# Patient Record
Sex: Male | Born: 1950 | Race: White | Hispanic: No | Marital: Married | Smoking: Never smoker
Health system: Southern US, Community
[De-identification: ages and names within clinical notes are randomized; demographics above are authoritative.]

## PROBLEM LIST (undated history)

## (undated) DIAGNOSIS — K219 Gastro-esophageal reflux disease without esophagitis: Secondary | ICD-10-CM

## (undated) DIAGNOSIS — M1990 Unspecified osteoarthritis, unspecified site: Secondary | ICD-10-CM

## (undated) DIAGNOSIS — J45909 Unspecified asthma, uncomplicated: Secondary | ICD-10-CM

## (undated) DIAGNOSIS — T7840XA Allergy, unspecified, initial encounter: Secondary | ICD-10-CM

## (undated) DIAGNOSIS — H269 Unspecified cataract: Secondary | ICD-10-CM

## (undated) HISTORY — DX: Gastro-esophageal reflux disease without esophagitis: K21.9

## (undated) HISTORY — DX: Unspecified cataract: H26.9

## (undated) HISTORY — DX: Unspecified osteoarthritis, unspecified site: M19.90

## (undated) HISTORY — PX: PERIPHERAL VASCULAR THROMBECTOMY: CATH118306

## (undated) HISTORY — DX: Unspecified asthma, uncomplicated: J45.909

## (undated) HISTORY — PX: EYE SURGERY: SHX253

## (undated) HISTORY — DX: Allergy, unspecified, initial encounter: T78.40XA

## (undated) HISTORY — PX: HERNIA REPAIR: SHX51

---

## 2009-02-25 ENCOUNTER — Encounter: Admission: RE | Admit: 2009-02-25 | Discharge: 2009-02-25 | Payer: Self-pay | Admitting: Gastroenterology

## 2012-10-01 ENCOUNTER — Ambulatory Visit (INDEPENDENT_AMBULATORY_CARE_PROVIDER_SITE_OTHER): Payer: 59 | Admitting: Physician Assistant

## 2012-10-01 VITALS — BP 108/67 | HR 88 | Temp 100.0°F | Resp 16 | Ht 67.0 in | Wt 162.0 lb

## 2012-10-01 DIAGNOSIS — J069 Acute upper respiratory infection, unspecified: Secondary | ICD-10-CM

## 2012-10-01 MED ORDER — IPRATROPIUM BROMIDE 0.03 % NA SOLN: 2.0000 | Freq: Two times a day (BID) | NASAL | Status: DC

## 2012-10-01 MED ORDER — GUAIFENESIN ER 1200 MG PO TB12: 1.0000 | ORAL_TABLET | Freq: Two times a day (BID) | ORAL | Status: DC | PRN

## 2012-10-01 MED ORDER — BENZONATATE 100 MG PO CAPS: 100.0000 mg | ORAL_CAPSULE | Freq: Three times a day (TID) | ORAL | Status: DC | PRN

## 2012-10-01 NOTE — Progress Notes (Signed)
  Subjective:    Patient ID: Randall Scott, male    DOB: 1950-07-31, 62 y.o.   MRN: [id number]  HPI This 62 y.o. male presents for evaluation of illness x 4 days.  Began as a scratchy throat and a little bit of cough, and progressed.  Low grade fever.  HA.  ST. Non-productive cough.  Feels tight in the upper chest. [place].  No GI/GU symptoms.   Past Medical History  Diagnosis Date  . Allergy   . Asthma     Past Surgical History  Procedure Laterality Date  . Hernia repair      Prior to Admission medications   Medication Sig Start Date End Date Taking? Authorizing Provider  aspirin 81 MG tablet Take 81 mg by mouth daily.   Yes Historical Provider, MD    No Known Allergies  History   Social History  . Marital Status: Married    Spouse Name: Aram Beecham    Number of Children: 2  . Years of Education: 12   Occupational History  . Machinist    Social History Main Topics  . Smoking status: Never Smoker   . Smokeless tobacco: Never Used  . Alcohol Use: No  . Drug Use: No  . Sexually Active: Yes    Birth Control/ Protection: None   Other Topics Concern  . Not on file   Social History Narrative   Lives with his wife.  Daughters are grown and live nearby.    Family History  Problem Relation Age of Onset  . Lung cancer Mother   . Lung cancer Father      Review of Systems As above.  No chest pain, SOB, HA, dizziness, vision change, N/V, diarrhea, constipation, dysuria, urinary urgency or frequency, myalgias, arthralgias or rash.     Objective:   Physical Exam  Blood pressure 108/67, pulse 88, temperature 100 F (37.8 C), resp. rate 16, height 5\' 7"  (1.702 m), weight 162 lb (73.483 kg). Body mass index is 25.37 kg/(m^2). Well-developed, well nourished WM who is awake, alert and oriented, in NAD. HEENT: [place]/AT, PERRL, EOMI.  Sclera and conjunctiva are clear.  EAC are patent, TMs are normal in appearance. Nasal mucosa is pink and moist. OP is clear. Neck: supple,  non-tender, no lymphadenopathy, thyromegaly. Heart: RRR, no murmur Lungs: normal effort, CTA Extremities: no cyanosis, clubbing or edema. Skin: warm and dry without rash. Psychologic: good mood and appropriate affect, normal speech and behavior.       Assessment & Plan:  Viral URI with cough - Plan: benzonatate (TESSALON) 100 MG capsule, ipratropium (ATROVENT) 0.03 % nasal spray, DISCONTINUED: Guaifenesin (MUCINEX MAXIMUM STRENGTH) 1200 MG TB12  He doesn't swallow big pills well, so will use OTC mucinex regular strength, which can be cut.  Anticipatory guidance provided.  Supportive care.

## 2012-10-01 NOTE — Patient Instructions (Signed)
Get plenty of rest and drink at least 64 ounces of water daily. 

## 2012-12-09 ENCOUNTER — Ambulatory Visit (INDEPENDENT_AMBULATORY_CARE_PROVIDER_SITE_OTHER): Payer: 59 | Admitting: Family Medicine

## 2012-12-09 VITALS — BP 126/84 | HR 60 | Temp 97.9°F | Resp 16 | Ht 67.25 in | Wt 152.8 lb

## 2012-12-09 DIAGNOSIS — L237 Allergic contact dermatitis due to plants, except food: Secondary | ICD-10-CM

## 2012-12-09 DIAGNOSIS — S058X9A Other injuries of unspecified eye and orbit, initial encounter: Secondary | ICD-10-CM

## 2012-12-09 DIAGNOSIS — S0502XA Injury of conjunctiva and corneal abrasion without foreign body, left eye, initial encounter: Secondary | ICD-10-CM

## 2012-12-09 DIAGNOSIS — L255 Unspecified contact dermatitis due to plants, except food: Secondary | ICD-10-CM

## 2012-12-09 MED ORDER — METHYLPREDNISOLONE ACETATE 80 MG/ML IJ SUSP
120.0000 mg | Freq: Once | INTRAMUSCULAR | Status: AC
  Administered 2012-12-09: 120 mg via INTRAMUSCULAR

## 2012-12-09 NOTE — Progress Notes (Signed)
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 This is typical for poison ivy he's had in the past 2: Patient was stuck in the eye the branch then has had persistent redness, discomfort and foreign body sensation. He's had brief loss of vision but now is seen more close to where he usually sees.  Objective: Patient has multiple streaking papules over the abdomen and both forearms Indication the eye reveals normal lid eversion, pupils equal reactive, normal fundi, fluorescin positive superficial 3-4 mm streak in the medial aspect of the cornea only left thigh  Assessment: Poison ivy and left corneal abrasion  Plan: Depo-Medrol 129 Patient given instructions regarding corneal abrasion with the understanding that this should resolve overnight.    signed, Elvina Sidle, Md

## 2012-12-09 NOTE — Patient Instructions (Addendum)
Poison [employer] ivy is a inflammation of the skin (contact dermatitis) caused by touching the allergens on the leaves of the ivy plant following previous exposure to the plant. The rash usually appears 48 hours after exposure. The rash is usually bumps (papules) or blisters (vesicles) in a linear pattern. Depending on your own sensitivity, the rash may simply cause redness and itching, or it may also progress to blisters which may break open. These must be well cared for to prevent secondary bacterial (germ) infection, followed by scarring. Keep any open areas dry, clean, dressed, and covered with an antibacterial ointment if needed. The eyes may also get puffy. The puffiness is worst in the morning and gets better as the day progresses. This dermatitis usually heals without scarring, within 2 to 3 weeks without treatment. HOME CARE INSTRUCTIONS  Thoroughly wash with soap and water as soon as you have been exposed to poison ivy. You have about one half hour to remove the plant resin before it will cause the rash. This washing will destroy the oil or antigen on the skin that is causing, or will cause, the rash. Be sure to wash under your fingernails as any plant resin there will continue to spread the rash. Do not rub skin vigorously when washing affected area. Poison ivy cannot spread if no oil from the plant remains on your body. A rash that has progressed to weeping sores will not spread the rash unless you have not washed thoroughly. It is also important to wash any clothes you have been wearing as these may carry active allergens. The rash will return if you wear the unwashed clothing, even several days later. Avoidance of the plant in the future is the best measure. Poison ivy plant can be recognized by the number of leaves. Generally, poison ivy has three leaves with flowering branches on a single stem. Diphenhydramine may be purchased over the counter and used as needed for itching. Do not drive with  this medication if it makes you drowsy.Ask your caregiver about medication for children. SEEK MEDICAL CARE IF:  Open sores develop.  Redness spreads beyond area of rash.  You notice purulent (pus-like) discharge.  You have increased pain.  Other signs of infection develop (such as fever). Document Released: 06/16/2000 Document Revised: 09/11/2011 Document Reviewed: 05/05/2009 [facility] Patient Information 2014 [place], [place]. Corneal Abrasion The cornea is the clear covering at the front and center of the eye. When looking at the colored portion (iris) of the eye, you are looking through that person's cornea.  This very thin tissue is made up of many layers. The surface layer is a single layer of cells called the corneal epithelium. This is one of the most sensitive tissues in the body. If a scratch or injury causes the corneal epithelium to come off, it is called a corneal abrasion. If the injury extends to the tissues below the epithelium, the condition is called a corneal ulcer.  CAUSES   Scratches.  Trauma.  Foreign body in the eye.  Some people have recurrences of abrasions in the area of the original injury even after they heal. This is called recurrent erosion syndrome. Recurrent erosion syndromes generally improve and go away with time. SYMPTOMS   Eye pain.  Difficulty or inability to keep the injured eye open.  The eye becomes very sensitive to light.  Recurrent erosions tend to happen suddenly, first thing in the morning  usually upon awakening and opening the eyes. DIAGNOSIS  Your eye professional  can diagnose a corneal abrasion during an eye exam. Dye is usually placed in the eye using a drop or a small paper strip moistened by the patient's tears. When the eye is examined with a special light, the abrasion shows up clearly because of the dye. TREATMENT   Small abrasions may be treated with antibiotic drops or ointment alone.  Usually a pressure patch is specially  applied. Pressure patches prevent the eye from blinking, allowing the corneal epithelium to heal. Because blinking is less, a pressure patch also reduces the amount of pain present in the eye during healing. Most corneal abrasions heal within 2-3 days with no effect on vision. WARNING: Do not drive or operate machinery while your eye is patched. Your ability to judge distances is impaired.  If abrasion becomes infected and spreads to the deeper tissues of the cornea, a corneal ulcer can result. This is serious because it can cause corneal scarring. Corneal scars interfere with light passing through the cornea, and cause a loss of vision in the involved eye.  If your caregiver has given you a follow-up appointment, it is very important to keep that appointment. Not keeping the appointment could result in a severe eye infection or permanent loss of vision. If there is any problem keeping the appointment, you must call back to this facility for assistance. SEEK MEDICAL CARE IF:   You have pain, light sensitivity and a scratchy feeling in one eye (or both).  Your pressure patch keeps loosening up and you can blink your eye under the patch after treatment.  Any kind of discharge develops from the involved eye after treatment or if the lids stick together in the morning.  You have the same symptoms in the morning as you did with the original abrasion days, weeks or months after the abrasion healed. MAKE SURE YOU:   Understand these instructions.  Will watch your condition.  Will get help right away if you are not doing well or get worse. Document Released: 06/16/2000 Document Revised: 09/11/2011 Document Reviewed: 01/23/2008 [facility] Patient Information 2014 [place], [place].

## 2012-12-26 ENCOUNTER — Other Ambulatory Visit: Payer: Self-pay | Admitting: Family Medicine

## 2012-12-26 ENCOUNTER — Ambulatory Visit (INDEPENDENT_AMBULATORY_CARE_PROVIDER_SITE_OTHER): Payer: 59 | Admitting: Family Medicine

## 2012-12-26 VITALS — BP 110/70 | HR 54 | Temp 98.0°F | Resp 17 | Ht 67.0 in | Wt 150.0 lb

## 2012-12-26 DIAGNOSIS — Z23 Encounter for immunization: Secondary | ICD-10-CM

## 2012-12-26 DIAGNOSIS — R1319 Other dysphagia: Secondary | ICD-10-CM

## 2012-12-26 DIAGNOSIS — D7589 Other specified diseases of blood and blood-forming organs: Secondary | ICD-10-CM

## 2012-12-26 DIAGNOSIS — N529 Male erectile dysfunction, unspecified: Secondary | ICD-10-CM

## 2012-12-26 DIAGNOSIS — Z0000 Encounter for general adult medical examination without abnormal findings: Secondary | ICD-10-CM

## 2012-12-26 DIAGNOSIS — R252 Cramp and spasm: Secondary | ICD-10-CM

## 2012-12-26 LAB — POCT CBC
MCH, POC: 33.7 pg — AB (ref 27–31.2)
MID (cbc): 0.5 (ref 0–0.9)
MPV: 8 fL (ref 0–99.8)
POC Granulocyte: 8.2 — AB (ref 2–6.9)
POC MID %: 4.9 %M (ref 0–12)
Platelet Count, POC: 193 10*3/uL (ref 142–424)
RBC: 5.01 M/uL (ref 4.69–6.13)
WBC: 10.4 10*3/uL — AB (ref 4.6–10.2)

## 2012-12-26 LAB — COMPREHENSIVE METABOLIC PANEL
ALT: 14 U/L (ref 0–53)
AST: 15 U/L (ref 0–37)
Chloride: 103 mEq/L (ref 96–112)
Creat: 1.04 mg/dL (ref 0.50–1.35)
Total Bilirubin: 0.9 mg/dL (ref 0.3–1.2)

## 2012-12-26 LAB — LIPID PANEL
Total CHOL/HDL Ratio: 2.6 Ratio
VLDL: 9 mg/dL (ref 0–40)

## 2012-12-26 LAB — PSA: PSA: 2.26 ng/mL (ref <=4.00)

## 2012-12-26 LAB — TSH: TSH: 1.602 u[IU]/mL (ref 0.350–4.500)

## 2012-12-26 MED ORDER — SILDENAFIL CITRATE 100 MG PO TABS: 50.0000 mg | ORAL_TABLET | Freq: Every day | ORAL | Status: DC | PRN

## 2012-12-26 NOTE — Patient Instructions (Addendum)
I will be in touch with your labs when they come in.  Don't fill the viagra just yet- let me look into your headache issue a bit first.   Please give Dr. Evette Cristal a call about your colonoscopy and swallowing issues.   Herbert Moors F MD Address: [address] [place], [place], [place] [address] [phone] Tetanus, Diphtheria, Pertussis (Tdap) Vaccine What You Need to Know WHY GET VACCINATED? Tetanus, diphtheria and pertussis can be very serious diseases, even for adolescents and adults. Tdap vaccine can protect [place] from these diseases. TETANUS (Lockjaw) causes painful muscle tightening and stiffness, usually all over the body.  It can lead to tightening of muscles in the head and neck so you can't open your mouth, swallow, or sometimes even breathe. Tetanus kills about 1 out of 5 people who are infected. DIPHTHERIA can cause a thick coating to form in the back of the throat.  It can lead to breathing problems, paralysis, heart failure, and death. PERTUSSIS (Whooping Cough) causes severe coughing spells, which can cause difficulty breathing, vomiting and disturbed sleep.  It can also lead to weight loss, incontinence, and rib fractures. Up to 2 in 100 adolescents and 5 in 100 adults with pertussis are hospitalized or have complications, which could include pneumonia and death. These diseases are caused by bacteria. Diphtheria and pertussis are spread from person to person through coughing or sneezing. Tetanus enters the body through cuts, scratches, or wounds. Before vaccines, the [place] saw as many as 200,000 cases a year of diphtheria and pertussis, and hundreds of cases of tetanus. Since vaccination began, tetanus and diphtheria have dropped by about 99% and pertussis by about 80%. TDAP VACCINE Tdap vaccine can protect adolescents and adults from tetanus, diphtheria, and pertussis. One dose of Tdap is routinely given at age 71 or 88. People who did not get Tdap at that age should get it as  soon as possible. Tdap is especially important for health care professionals and anyone having close contact with a baby younger than 12 months. Pregnant women should get a dose of Tdap during every pregnancy, to protect the newborn from pertussis. Infants are most at risk for severe, life-threatening complications from pertussis. A similar vaccine, called Td, protects from tetanus and diphtheria, but not pertussis. A Td booster should be given every 10 years. Tdap may be given as one of these boosters if you have not already gotten a dose. Tdap may also be given after a severe cut or burn to prevent tetanus infection. Your doctor can give you more information. Tdap may safely be given at the same time as other vaccines. SOME PEOPLE SHOULD NOT GET THIS VACCINE  If you ever had a life-threatening allergic reaction after a dose of any tetanus, diphtheria, or pertussis containing vaccine, OR if you have a severe allergy to any part of this vaccine, you should not get Tdap. Tell your doctor if you have any severe allergies.  If you had a coma, or long or multiple seizures within 7 days after a childhood dose of DTP or DTaP, you should not get Tdap, unless a cause other than the vaccine was found. You can still get Td.  Talk to your doctor if you:  have epilepsy or another nervous system problem,  had severe pain or swelling after any vaccine containing diphtheria, tetanus or pertussis,  ever had Guillain-Barr Syndrome (GBS),  aren't feeling well on the day the shot is scheduled. RISKS OF A VACCINE REACTION With any medicine,  including vaccines, there is a chance of side effects. These are usually mild and go away on their own, but serious reactions are also possible. Brief fainting spells can follow a vaccination, leading to injuries from falling. Sitting or lying down for about 15 minutes can help prevent these. Tell your doctor if you feel dizzy or light-headed, or have vision changes or  ringing in the ears. Mild problems following Tdap (Did not interfere with activities)  Pain where the shot was given (about 3 in 4 adolescents or 2 in 3 adults)  Redness or swelling where the shot was given (about 1 person in 5)  Mild fever of at least 100.71F (up to about 1 in 25 adolescents or 1 in 100 adults)  Headache (about 3 or 4 people in 10)  Tiredness (about 1 person in 3 or 4)  Nausea, vomiting, diarrhea, stomach ache (up to 1 in 4 adolescents or 1 in 10 adults)  Chills, body aches, sore joints, rash, swollen glands (uncommon) Moderate problems following Tdap (Interfered with activities, but did not require medical attention)  Pain where the shot was given (about 1 in 5 adolescents or 1 in 100 adults)  Redness or swelling where the shot was given (up to about 1 in 16 adolescents or 1 in 25 adults)  Fever over 102F (about 1 in 100 adolescents or 1 in 250 adults)  Headache (about 3 in 20 adolescents or 1 in 10 adults)  Nausea, vomiting, diarrhea, stomach ache (up to 1 or 3 people in 100)  Swelling of the entire arm where the shot was given (up to about 3 in 100). Severe problems following Tdap (Unable to perform usual activities, required medical attention)  Swelling, severe pain, bleeding and redness in the arm where the shot was given (rare). A severe allergic reaction could occur after any vaccine (estimated less than 1 in a million doses). WHAT IF THERE IS A SERIOUS REACTION? What should I look for?  Look for anything that concerns you, such as signs of a severe allergic reaction, very high fever, or behavior changes. Signs of a severe allergic reaction can include hives, swelling of the face and throat, difficulty breathing, a fast heartbeat, dizziness, and weakness. These would start a few minutes to a few hours after the vaccination. What should I do?  If you think it is a severe allergic reaction or other emergency that can't wait, call 9-1-1 or get the person  to the nearest hospital. Otherwise, call your doctor.  Afterward, the reaction should be reported to the "Vaccine Adverse Event Reporting System" (VAERS). Your doctor might file this report, or you can do it yourself through the VAERS web site at www.vaers.LAgents.no, or by calling 1-806-069-0164. VAERS is only for reporting reactions. They do not give medical advice.  THE NATIONAL VACCINE INJURY COMPENSATION PROGRAM The National Vaccine Injury Compensation Program (VICP) is a federal program that was created to compensate people who may have been injured by certain vaccines. Persons who believe they may have been injured by a vaccine can learn about the program and about filing a claim by calling 1-(934)701-7053 or visiting the VICP website at SpiritualWord.at. HOW CAN I LEARN MORE?  Ask your doctor.  Call your local or state health department.  Contact the Centers for Disease Control and Prevention (CDC):  Call 219-113-3954 or visit CDC's website at PicCapture.uy. CDC Tdap Vaccine VIS (11/09/11) Document Released: 12/19/2011 Document Revised: 03/13/2012 Document Reviewed: 12/19/2011 [employer] Patient Information 2014 [place], [place].

## 2012-12-26 NOTE — Progress Notes (Addendum)
[facility] [address] [place] [address] [phone]  Date:  12/26/2012   Name:  Randall Scott   DOB:  Jan 07, 1951   MRN:  [id number]  PCP:  No primary provider on file.    Chief Complaint: Annual Exam   History of Present Illness:  Randall Scott is a 62 y.o. very pleasant male patient who presents with the following:  Here today for a CPE. He is generally quite healthy.    He is fasting today.  He has a few concerns.  He had some hand cramping off an on- every now and then.  The cramping may last for 10 or 15 minutes. He may get this cramping once a week, and has noted it for about 2 months.  He is having some trouble swallowing.  He continues to have this problem- he sees Dr. Shawnie Dapper for GI. In 2010 he had a swallowing study which was mildly abornmal per report.  However, he was not told to follow-up.   He is also due for a colonoscopy, last was in 2003  He also notes that he had a "real bad" headache after sexual intercourse a couple of times. This last occurred about 6 months ago.  He has used viagra in the past- however, he has not used it in a couple of of years.  The HA has not been associated with viagra use.   He gets an ocasional HA at baseline, but nothing usually.   No syncope. No CP.    He is "geting by" without viagra, but would like to have some for prn use.    Plain Td in 2006- he has a new grandchild and is ok with getting a tdap today He has had the zostavax vaccine already   There are no active problems to display for this patient.   Past Medical History  Diagnosis Date  . Allergy   . Asthma     Past Surgical History  Procedure Laterality Date  . Hernia repair      History  Substance Use Topics  . Smoking status: Never Smoker   . Smokeless tobacco: Never Used  . Alcohol Use: No    Family History  Problem Relation Age of Onset  . Lung cancer Mother   . Lung cancer Father     No Known Allergies  Medication list has  been reviewed and updated.  Current Outpatient Prescriptions on File Prior to Visit  Medication Sig Dispense Refill  . aspirin 81 MG tablet Take 81 mg by mouth daily.      Marland Kitchen loratadine (CLARITIN) 10 MG tablet Take 10 mg by mouth daily.       No current facility-administered medications on file prior to visit.    Review of Systems:  As per HPI- otherwise negative.   Physical Examination: Filed Vitals:   12/26/12 0900  BP: 104/68  Pulse: 54  Temp: 98 F (36.7 C)  Resp: 17   Filed Vitals:   12/26/12 0900  Height: 5\' 7"  (1.702 m)  Weight: 150 lb (68.04 kg)   Body mass index is 23.49 kg/(m^2). Ideal Body Weight: Weight in (lb) to have BMI = 25: 159.3  GEN: WDWN, NAD, Non-toxic, A & O x 3, looks well HEENT: Atraumatic, Normocephalic. Neck supple. No masses, No LAD. Bilateral TM wnl, oropharynx normal.  PEERL,EOMI.   Ears and Nose: No external deformity. CV: RRR, No M/G/R. No JVD. No thrill. No extra heart sounds. PULM:  CTA B, no wheezes, crackles, rhonchi. No retractions. No resp. distress. No accessory muscle use. ABD: S, NT, ND, +BS. No rebound. No HSM. EXTR: No c/c/e NEURO Normal gait. Normal strength and DTR all extremities  PSYCH: Normally interactive. Conversant. Not depressed or anxious appearing.  Calm demeanor.  Gu:  No scrotal masses, no inguinal hernia, penis normal Results for orders placed in visit on 12/26/12  POCT CBC      Result Value Range   WBC 10.4 (*) 4.6 - 10.2 K/uL   Lymph, poc 1.7  0.6 - 3.4   POC LYMPH PERCENT 16.2  10 - 50 %L   MID (cbc) 0.5  0 - 0.9   POC MID % 4.9  0 - 12 %M   POC Granulocyte 8.2 (*) 2 - 6.9   Granulocyte percent 78.9  37 - 80 %G   RBC 5.01  4.69 - 6.13 M/uL   Hemoglobin 16.9  14.1 - 18.1 g/dL   HCT, POC 28.4  13.2 - 53.7 %   MCV 103.4 (*) 80 - 97 fL   MCH, POC 33.7 (*) 27 - 31.2 pg   MCHC 32.6  31.8 - 35.4 g/dL   RDW, POC 44.0     Platelet Count, POC 193  142 - 424 K/uL   MPV 8.0  0 - 99.8 fL    Assessment and  Plan: Physical exam - Plan: POCT CBC, Comprehensive metabolic panel, Lipid panel, PSA  Muscle cramps - Plan: Comprehensive metabolic panel  Other dysphagia - Plan: TSH  Erectile dysfunction - Plan: sildenafil (VIAGRA) 100 MG tablet  Healthy male here for a CPE today. Updated Tdap, await labs.   Muscle cramps: await CMP Refilled his viagra, but asked him to wait to fill this until I can look into his intercourse - associated headaches in more detail. He will go back and see his GI doctor, Dr. Shawnie Dapper, asap for his swallowing issue and screening colonoscopy.   Signed Abbe Amsterdam, MD  12/31/12- called to go over labs.  Labs look great overall, he does have an increased MCV and his B12/ folate are normal.  He does not generally drink alcohol.  We will plan to recheck in 2 months to see if this persists.   Regarding headaches; went over ddx, from a benign condition to aneurysm.  Offered an MRI and/ or neurology consultation.  At this time he has been without any of these HA in a long time.  He would prefer to think about this, and will let me know if he desires any further evaluation.  He will try some tonic water/ sports drinks for his cramps- let me know if not helpful.

## 2012-12-31 ENCOUNTER — Encounter: Payer: Self-pay | Admitting: Family Medicine

## 2012-12-31 NOTE — Addendum Note (Signed)
Addended by: Abbe Amsterdam C on: 12/31/2012 04:18 PM   Modules accepted: Orders

## 2013-01-01 ENCOUNTER — Telehealth: Payer: Self-pay

## 2013-01-01 NOTE — Telephone Encounter (Signed)
Pt has more questions re physical he completed a few days ago,he is asking for call back from dr Ovid Curd phone [phone]

## 2013-01-02 NOTE — Telephone Encounter (Signed)
Spoke to patient , he wants to discuss having a MRI scan for headaches, I asked him if he wants to proceed, he wants to discuss with you first. He also indicates he wants to know if you think he may have an aneurysm, he states he did call his insurance company, wants to make sure you absolutely think scan is necessary before he has this done.

## 2013-01-03 NOTE — Telephone Encounter (Signed)
Called him back and left detailed message.  If he would like to do an MRI to rule- out an aneurysm I am glad to order it for him, I am also glad to set up a neurology consultation.  I cannot put a number on his chance of having an aneurysm or other significant pathology.  A neurologist/ NSG might be better able to delineate his risk so he can decide about having the MRI

## 2013-01-15 ENCOUNTER — Other Ambulatory Visit: Payer: Self-pay | Admitting: Gastroenterology

## 2013-01-15 DIAGNOSIS — R1310 Dysphagia, unspecified: Secondary | ICD-10-CM

## 2013-01-17 ENCOUNTER — Ambulatory Visit
Admission: RE | Admit: 2013-01-17 | Discharge: 2013-01-17 | Disposition: A | Discharge status: Home / Self Care | Payer: 59 | Source: Ambulatory Visit | Attending: Gastroenterology | Admitting: Gastroenterology

## 2013-01-17 DIAGNOSIS — R1310 Dysphagia, unspecified: Secondary | ICD-10-CM

## 2013-03-25 ENCOUNTER — Ambulatory Visit (INDEPENDENT_AMBULATORY_CARE_PROVIDER_SITE_OTHER): Payer: 59 | Admitting: Family Medicine

## 2013-03-25 VITALS — BP 112/76 | HR 54 | Temp 98.0°F | Resp 16 | Ht 66.75 in | Wt 160.0 lb

## 2013-03-25 DIAGNOSIS — H9190 Unspecified hearing loss, unspecified ear: Secondary | ICD-10-CM

## 2013-03-25 DIAGNOSIS — M7711 Lateral epicondylitis, right elbow: Secondary | ICD-10-CM

## 2013-03-25 DIAGNOSIS — N529 Male erectile dysfunction, unspecified: Secondary | ICD-10-CM

## 2013-03-25 DIAGNOSIS — Z23 Encounter for immunization: Secondary | ICD-10-CM

## 2013-03-25 DIAGNOSIS — H9193 Unspecified hearing loss, bilateral: Secondary | ICD-10-CM

## 2013-03-25 DIAGNOSIS — M7710 Lateral epicondylitis, unspecified elbow: Secondary | ICD-10-CM

## 2013-03-25 MED ORDER — SILDENAFIL CITRATE 100 MG PO TABS: 50.0000 mg | ORAL_TABLET | Freq: Every day | ORAL | Status: DC | PRN

## 2013-03-25 MED ORDER — MELOXICAM 7.5 MG PO TABS: 7.5000 mg | ORAL_TABLET | Freq: Every day | ORAL | Status: DC

## 2013-03-25 NOTE — Patient Instructions (Addendum)
I think that you have lateral epicondylitis, otherwise known as tennis elbow.  I will get you referred to sports med for further treatment and possible injection.  In the meantime you can try taking mobic once a day .    Some good audiology offices in town include  [facility] [address] [place], [place] [address] [phone]  [facility] [address] [place], [place] [phone]  [facility]   Tennis Elbow Your caregiver has diagnosed you with a condition often referred to as "tennis elbow." This results from small tears or soreness (inflammation) at the start (origin) of the extensor muscles of the forearm. Although the condition is often called tennis or golfer's elbow, it is caused by any repetitive action performed by your elbow. HOME CARE INSTRUCTIONS  If the condition has been short lived, rest may be the only treatment required. Using your opposite hand or arm to perform the task may help. Even changing your grip may help rest the extremity. These may even prevent the condition from recurring.  Longer standing problems, however, will often be relieved faster by:  Using anti-inflammatory agents.  Applying ice packs for 30 minutes at the end of the working day, at bed time, or when activities are finished.  Your caregiver may also have you wear a splint or sling. This will allow the inflamed tendon to heal. At times, steroid injections aided with a local anesthetic will be required along with splinting for 1 to 2 weeks. Two to three steroid injections will often solve the problem. In some long standing cases, the inflamed tendon does not respond to conservative (non-surgical) therapy. Then surgery may be required to repair it. MAKE SURE YOU:   Understand these instructions.  Will watch your condition.  Will get help right away if you are not doing well or get worse. Document Released: 06/19/2005 Document Revised: 09/11/2011 Document  Reviewed: 02/05/2008 [facility] Patient Information 2014 [place], [place].

## 2013-03-25 NOTE — Progress Notes (Signed)
[facility] [address], [place] [address] [phone]  Date:  03/25/2013   Name:  Randall Scott   DOB:  [birth year]   MRN:  [id number]  PCP:  No PCP Per Patient    Chief Complaint: Tendonitis   History of Present Illness:  Randall Scott is a 62 y.o. very pleasant male patient who presents with the following:  He has noted tendonitis in his right elbow.  It has been present for about 3- 4 months.  He had a similar problem about 15 years ago and was treated with an injection.  He has been wearing an elbow sleeve but it is not really helping.   In the morning the elbow will feel stiff and is difficult to extend.  He has pain with gripping objects with the hand and holding heavy objects Insidious onset.  No apparent injury   He also has noted some trouble hearing when he is in a loud or crowded room.  He wonders if hearing aids might be helpful for him.  He only occasionally has tinnitus   There are no active problems to display for this patient.   Past Medical History  Diagnosis Date  . Allergy   . Asthma     Past Surgical History  Procedure Laterality Date  . Hernia repair      History  Substance Use Topics  . Smoking status: Never Smoker   . Smokeless tobacco: Never Used  . Alcohol Use: No    Family History  Problem Relation Age of Onset  . Lung cancer Mother   . Lung cancer Father     No Known Allergies  Medication list has been reviewed and updated.  Current Outpatient Prescriptions on File Prior to Visit  Medication Sig Dispense Refill  . aspirin 81 MG tablet Take 81 mg by mouth daily.      Marland Kitchen loratadine (CLARITIN) 10 MG tablet Take 10 mg by mouth daily.      . sildenafil (VIAGRA) 100 MG tablet Take 0.5-1 tablets (50-100 mg total) by mouth daily as needed for erectile dysfunction.  5 tablet  11   No current facility-administered medications on file prior to visit.    Review of Systems:  As per HPI- otherwise  negative.   Physical Examination: Filed Vitals:   03/25/13 0811  BP: 112/76  Pulse: 54  Temp: 98 F (36.7 C)  Resp: 16   Filed Vitals:   03/25/13 0811  Height: 5' 6.75" (1.695 m)  Weight: 160 lb (72.576 kg)   Body mass index is 25.26 kg/(m^2). Ideal Body Weight: Weight in (lb) to have BMI = 25: 158.1  GEN: WDWN, NAD, Non-toxic, A & O x 3 HEENT: Atraumatic, Normocephalic. Neck supple. No masses, No LAD.  Bilateral TM wnl, oropharynx normal.  PEERL,EOMI.   Ears and Nose: No external deformity. CV: RRR, No M/G/R. No JVD. No thrill. No extra heart sounds. PULM: CTA B, no wheezes, crackles, rhonchi. No retractions. No resp. distress. No accessory muscle use. EXTR: No c/c/e NEURO Normal gait.  PSYCH: Normally interactive. Conversant. Not depressed or anxious appearing.  Calm demeanor.  Right elbow:  He has full ROM, no redness, swelling or heat.  However he does have mild tenderness over the lateral epicondyle with direct palpation.  Negative to resisted pronation and supination  Assessment and Plan: Lateral epicondylitis (tennis elbow), right - Plan: meloxicam (MOBIC) 7.5 MG tablet, Ambulatory referral to Sports Medicine  Hearing loss, bilateral  Erectile dysfunction - Plan: sildenafil (VIAGRA) 100 MG tablet  Referral to sports med.  He may try mobic, but use caution as he is also on a baby asa Gave some information about reputable local audiology shops Refilled his viagra  Signed Abbe Amsterdam, MD

## 2013-03-31 ENCOUNTER — Encounter: Payer: Self-pay | Admitting: Family Medicine

## 2013-03-31 ENCOUNTER — Ambulatory Visit (INDEPENDENT_AMBULATORY_CARE_PROVIDER_SITE_OTHER): Payer: 59 | Admitting: Family Medicine

## 2013-03-31 VITALS — BP 138/90 | HR 59 | Ht 68.0 in | Wt 160.0 lb

## 2013-03-31 DIAGNOSIS — M7710 Lateral epicondylitis, unspecified elbow: Secondary | ICD-10-CM

## 2013-03-31 DIAGNOSIS — M7711 Lateral epicondylitis, right elbow: Secondary | ICD-10-CM

## 2013-03-31 NOTE — Patient Instructions (Addendum)
You have lateral epicondylitis Try to avoid painful activities as much as possible. Ice the area 3-4 times a day for 15 minutes at a time. Tylenol or aleve as needed for pain. Counterforce brace as directed can help unload area - wear this regularly if it provides you with relief. Home Pronation/supination with 1 pound weight, wrist extension, stretching - do these once a day - wait until Saturday to start. Consider formal physical therapy. Consider injection for short term pain relief if the above is not helping - you were given this today. Follow up with me in 5-6 weeks for reevaluation.

## 2013-04-01 ENCOUNTER — Encounter: Payer: Self-pay | Admitting: Family Medicine

## 2013-04-01 DIAGNOSIS — M7711 Lateral epicondylitis, right elbow: Secondary | ICD-10-CM | POA: Insufficient documentation

## 2013-04-01 NOTE — Progress Notes (Signed)
Patient ID: Randall Scott, male   DOB: [birth year], 62 y.o.   MRN: [id number]  PCP: No PCP Per Patient  Subjective:   HPI: Patient is a 62 y.o. male here for right elbow pain.  Patient reports prior history of tennis elbow over 15 years ago. States current pain started about 5 months ago lateral elbow. Pain when picking items up including coffee cup. Is right handed. Remotely tried a counterforce brace - not much help but injection helped a lot. No numbness/tingling.  Past Medical History  Diagnosis Date  . Allergy   . Asthma     Current Outpatient Prescriptions on File Prior to Visit  Medication Sig Dispense Refill  . aspirin 81 MG tablet Take 81 mg by mouth daily.      Marland Kitchen loratadine (CLARITIN) 10 MG tablet Take 10 mg by mouth daily.      . meloxicam (MOBIC) 7.5 MG tablet Take 1 tablet (7.5 mg total) by mouth daily.  30 tablet  0  . sildenafil (VIAGRA) 100 MG tablet Take 0.5-1 tablets (50-100 mg total) by mouth daily as needed for erectile dysfunction.  5 tablet  11   No current facility-administered medications on file prior to visit.    Past Surgical History  Procedure Laterality Date  . Hernia repair      No Known Allergies  History   Social History  . Marital Status: Married    Spouse Name: Aram Beecham    Number of Children: 2  . Years of Education: 12   Occupational History  . Machinist    Social History Main Topics  . Smoking status: Never Smoker   . Smokeless tobacco: Never Used  . Alcohol Use: No  . Drug Use: No  . Sexual Activity: Yes    Birth Control/ Protection: None     Comment: married   Other Topics Concern  . Not on file   Social History Narrative   Lives with his wife.  Daughters are grown and live nearby.    Family History  Problem Relation Age of Onset  . Lung cancer Mother   . Lung cancer Father   . Sudden death Neg Hx   . Hypertension Neg Hx   . Hyperlipidemia Neg Hx   . Heart attack Neg Hx   . Diabetes Neg Hx     BP 138/90   Pulse 59  Ht 5\' 8"  (1.727 m)  Wt 160 lb (72.576 kg)  BMI 24.33 kg/m2  Review of Systems: See HPI above.    Objective:  Physical Exam:  Gen: NAD  Right elbow: No gross deformity, swelling, bruising. TTP lateral epicondyle.  No other TTP about elbow. FROM elbow and wrist - pain with supination, wrist extension, 3rd digit extension. Collateral ligaments intact. NVI distally.    Assessment & Plan:  1. Right lateral epicondylitis - home exercise program reviewed to do daily.  Icing, tylenol,  Nsaids.  Cortisone injection given today.  Consider PT, nitro patches (states he's not picking up or taking viagra he was prescribed due to cost) if not improving.  F/u in 5-6 weeks.  After informed written consent patient was seated on exam table.  Area overlying lateral epicondyle (just distal to this) in place of maximal pain prepped with alcohol swab then injected with 2:1 marcaine: depomedrol.  Patient tolerated procedure well without immediate complications.

## 2013-04-01 NOTE — Assessment & Plan Note (Signed)
home exercise program reviewed to do daily.  Icing, tylenol,  Nsaids.  Cortisone injection given today.  Consider PT, nitro patches (states he's not picking up or taking viagra he was prescribed due to cost) if not improving.  F/u in 5-6 weeks.  After informed written consent patient was seated on exam table.  Area overlying lateral epicondyle (just distal to this) in place of maximal pain prepped with alcohol swab then injected with 2:1 marcaine: depomedrol.  Patient tolerated procedure well without immediate complications.

## 2013-05-12 ENCOUNTER — Ambulatory Visit (INDEPENDENT_AMBULATORY_CARE_PROVIDER_SITE_OTHER): Payer: 59 | Admitting: Emergency Medicine

## 2013-05-12 ENCOUNTER — Ambulatory Visit: Payer: 59

## 2013-05-12 ENCOUNTER — Other Ambulatory Visit: Payer: Self-pay | Admitting: Emergency Medicine

## 2013-05-12 VITALS — BP 108/72 | HR 79 | Temp 97.9°F | Resp 16 | Ht 68.5 in | Wt 167.2 lb

## 2013-05-12 DIAGNOSIS — M549 Dorsalgia, unspecified: Secondary | ICD-10-CM

## 2013-05-12 DIAGNOSIS — M5412 Radiculopathy, cervical region: Secondary | ICD-10-CM

## 2013-05-12 DIAGNOSIS — M5010 Cervical disc disorder with radiculopathy, unspecified cervical region: Secondary | ICD-10-CM

## 2013-05-12 DIAGNOSIS — R7989 Other specified abnormal findings of blood chemistry: Secondary | ICD-10-CM

## 2013-05-12 LAB — POCT CBC
Hemoglobin: 14.6 g/dL (ref 14.1–18.1)
MPV: 7.8 fL (ref 0–99.8)
POC Granulocyte: 4.3 (ref 2–6.9)
POC MID %: 6 %M (ref 0–12)
RBC: 4.55 M/uL — AB (ref 4.69–6.13)

## 2013-05-12 LAB — POCT SEDIMENTATION RATE: POCT SED RATE: 7 mm/hr (ref 0–22)

## 2013-05-12 MED ORDER — PREDNISONE 10 MG PO TABS: ORAL_TABLET | ORAL | Status: DC

## 2013-05-12 NOTE — Progress Notes (Addendum)
Subjective:    Patient ID: Randall Scott, male    DOB: [birth year], 62 y.o.   MRN: [id number] This chart was scribed for Juliann Mule, MD by Valera Castle, ED Scribe. This patient was seen in room 12 and the patient's care was started at 7:58.  HPI HPI Comments: Randall Scott is a 62 y.o. male who presents to the [facility] complaining of sudden, burning, constant upper back pain, onset 5-6 days ago. He reports that sometimes when he twists/leans to the side a certain way it takes his breath away. He also states that deep breathing exacerbates the pain. He states the pain feels similar to a stiff neck, just in his back. He states usually he is able to sleep through the pain, but that the other night he woke up because he could not get comfortable enough due to the pain. He reports feeling healthy otherwise. He states his last physical was 12/2012. He reports that he was told to f/u with regards to his last visit. He denies SOB, abdominal pain, and any other associated symptoms.    Patient Active Problem List   Diagnosis Date Noted  . Lateral epicondylitis of right elbow 04/01/2013   Past Medical History  Diagnosis Date  . Allergy   . Asthma    Past Surgical History  Procedure Laterality Date  . Hernia repair     No Known Allergies Prior to Admission medications   Medication Sig Start Date End Date Taking? Authorizing Provider  aspirin 81 MG tablet Take 81 mg by mouth daily.    Historical Provider, MD  sildenafil (VIAGRA) 100 MG tablet Take 0.5-1 tablets (50-100 mg total) by mouth daily as needed for erectile dysfunction. 03/25/13   Pearline Cables, MD    Review of Systems  Respiratory: Negative.  Negative for shortness of breath.   Gastrointestinal: Negative for abdominal pain.  Musculoskeletal: Positive for back pain (burning, upper).       Objective:   Physical Exam  Nursing note and vitals reviewed. Constitutional: He is oriented to person, place, and time. He appears well-developed  and well-nourished. No distress.  HENT:  Head: Normocephalic and atraumatic.  Neck: Neck supple. No tracheal deviation present.  Cardiovascular: Normal rate.   Pulmonary/Chest: Effort normal. No respiratory distress.  Abdominal: Soft. There is no tenderness. There is no rebound and no guarding.  Musculoskeletal: Normal range of motion.  Neurological: He is alert and oriented to person, place, and time.  Skin: Skin is warm and dry.  Psychiatric: He has a normal mood and affect. His behavior is normal.   there is tenderness along the left medial scapula. Breath sounds are symmetrical. Cardiac is regular rate without murmur. The abdomen is soft liver and spleen not enlarged there are no areas of tenderness . 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-C7 chest x-ray was normal T-spine films are normal Results for orders placed in visit on 05/12/13  POCT CBC      Result Value Range   WBC 6.3  4.6 - 10.2 K/uL   Lymph, poc 1.6  0.6 - 3.4   POC LYMPH PERCENT 25.9  10 - 50 %L   MID (cbc) 0.4  0 - 0.9   POC MID % 6.0  0 - 12 %M   POC Granulocyte 4.3  2 - 6.9   Granulocyte percent 68.1  37 - 80 %G   RBC 4.55 (*) 4.69 - 6.13  M/uL   Hemoglobin 14.6  14.1 - 18.1 g/dL   HCT, POC 45.4  09.8 - 53.7 %   MCV 99.0 (*) 80 - 97 fL   MCH, POC 32.1 (*) 27 - 31.2 pg   MCHC 32.4  31.8 - 35.4 g/dL   RDW, POC 11.9     Platelet Count, POC 226  142 - 424 K/uL   MPV 7.8  0 - 99.8 fL        BP 108/72  Pulse 79  Temp(Src) 97.9 F (36.6 C) (Oral)  Resp 16  Ht 5' 8.5" (1.74 m)  Wt 167 lb 3.2 oz (75.841 kg)  BMI 25.05 kg/m2  SpO2 98%     Assessment & Plan:   Patient presents with periscapular pain I am concerned origin maybe his neck. We'll put on a Sterapred dose pack recheck 1 week if symptoms are persistent we'll get an MRI . CBC is better MCV is slightly elevated. Previous B12, folic acid levels were normal.

## 2013-05-20 ENCOUNTER — Telehealth: Payer: Self-pay

## 2013-05-20 NOTE — Telephone Encounter (Signed)
Patient would like to let Dr. Cleta Alberts know that he is feeling better. The back pain is not completely gone but he is feeling better.  Best [phone]

## 2013-05-20 NOTE — Telephone Encounter (Signed)
Okay to hold off on MRI at present time. please let appointments know.

## 2013-05-20 NOTE — Telephone Encounter (Signed)
Pt is feeling better. He reports he is not 100%. Pt would like to hold off on an MRI at this time. He will monitor for the next few weeks and give [place] a call if he wants to schedule an appt.

## 2013-05-21 NOTE — Telephone Encounter (Signed)
Message sent to appts in error, forwarding to referrals.

## 2013-08-12 ENCOUNTER — Telehealth: Payer: Self-pay

## 2013-08-12 DIAGNOSIS — R519 Headache, unspecified: Secondary | ICD-10-CM

## 2013-08-12 DIAGNOSIS — S0990XS Unspecified injury of head, sequela: Secondary | ICD-10-CM

## 2013-08-12 DIAGNOSIS — G44309 Post-traumatic headache, unspecified, not intractable: Secondary | ICD-10-CM

## 2013-08-12 DIAGNOSIS — R51 Headache: Principal | ICD-10-CM

## 2013-08-12 NOTE — Telephone Encounter (Signed)
Dr.Copland,  Pt states that he would like to speak with you regarding whether or not he should have an MRI. Best# [id number]

## 2013-08-13 NOTE — Telephone Encounter (Signed)
Called him back.  He mentioned having some headaches in June of 2014.  He is still having headaches sometimes after intercourse.  His headaches are not terribly severe.  I will refer him to neurology for further evaluation and determination of if he needs an MRI.   If any change in the meantime he will let me know

## 2013-08-19 ENCOUNTER — Ambulatory Visit: Payer: 59 | Admitting: Neurology

## 2013-08-21 ENCOUNTER — Encounter (INDEPENDENT_AMBULATORY_CARE_PROVIDER_SITE_OTHER): Payer: Self-pay

## 2013-08-21 ENCOUNTER — Encounter: Payer: Self-pay | Admitting: Neurology

## 2013-08-21 ENCOUNTER — Ambulatory Visit (INDEPENDENT_AMBULATORY_CARE_PROVIDER_SITE_OTHER): Payer: 59 | Admitting: Neurology

## 2013-08-21 VITALS — BP 116/82 | HR 72 | Ht 68.5 in | Wt 170.0 lb

## 2013-08-21 DIAGNOSIS — R51 Headache: Secondary | ICD-10-CM | POA: Insufficient documentation

## 2013-08-21 DIAGNOSIS — R519 Headache, unspecified: Secondary | ICD-10-CM | POA: Insufficient documentation

## 2013-08-21 NOTE — Patient Instructions (Signed)
Overall you are doing fairly well but I do want to suggest a few things today:   Remember to drink plenty of fluid, eat healthy meals and do not skip any meals. Try to eat protein with a every meal and eat a healthy snack such as fruit or nuts in between meals. Try to keep a regular sleep-wake schedule and try to exercise daily, particularly in the form of walking, 20-30 minutes a day, if you can.   As far as your medications are concerned, I would like to suggest the following: 1)Please try taking an ibuprofen 30 to 60 minutes prior to intercourse. This should help relieve your headache symptoms  We will check a MRA of the brain to look at the vessels, you will be called to schedule this.  Please call us with any interim questions, concerns, problems, updates or refill requests.   My clinical assistant and will answer any of your questions and relay your messages to me and also relay most of my messages to you.   Our phone number is [phone]. We also have an after hours call service for urgent matters and there is a physician on-call for urgent questions. For any emergencies you know to call 911 or go to the nearest emergency room

## 2013-08-21 NOTE — Progress Notes (Signed)
[facility]    Provider:  Dr Janann Colonel Referring Provider: Lorelei Pont Gay Filler, MD Primary Care Physician:  Lamar Blinks, MD  CC:  headache  HPI:  Randall Scott is a 63 y.o. male here as a referral from Dr. Lorelei Pont for headache evaluation  He first noted headaches during intercourse, described as bitemporal, lasting 10-15 minutes, since then has had a few more since then but more mild. Describes onset as slow and progressive, occurred during intercourse and reached peak at orgasm. Described as a squeezing sensation, no N/V, no change in vision, no focal motor or sensory changes. Went away on its own after around 15 minutes. At worse was 7/10. Able to communicate during the headaches, no confusion, no tinnitus.  Lately appears to be happening every time he has intercourse. Not currently using viagra. Has had tension type headaches in the past but nothing like this. Has occasional cervical neck stiffness.   Overall healthy, no HTN. No history of stroke or TIA. No known family history of aneurysms, dementia, strokes.    Review of Systems: Out of a complete 14 system review, the patient complains of only the following symptoms, and all other reviewed systems are negative. + for dizziness, headache, difficulty swallowing, insomnia, snoring  History   Social History  . Marital Status: Married    Spouse Name: Caren Griffins    Number of Children: 2  . Years of Education: 12   Occupational History  . Machinist    Social History Main Topics  . Smoking status: Never Smoker   . Smokeless tobacco: Never Used  . Alcohol Use: No  . Drug Use: No  . Sexual Activity: Yes    Birth Control/ Protection: None     Comment: married   Other Topics Concern  . Not on file   Social History Narrative   Lives with his wife.  Daughters are grown and live nearby.   Patient is right handed   Education level is 12   Caffeine consumption is 2-3 cups daily    Family History  Problem  Relation Age of Onset  . Lung cancer Mother   . Lung cancer Father   . Sudden death Neg Hx   . Hypertension Neg Hx   . Hyperlipidemia Neg Hx   . Heart attack Neg Hx   . Diabetes Neg Hx     Past Medical History  Diagnosis Date  . Allergy   . Asthma     Past Surgical History  Procedure Laterality Date  . Hernia repair      Current Outpatient Prescriptions  Medication Sig Dispense Refill  . aspirin 81 MG tablet Take 81 mg by mouth daily.       No current facility-administered medications for this visit.    Allergies as of 08/21/2013  . (No Known Allergies)    Vitals: BP 116/82  Pulse 72  Ht 5' 8.5" (1.74 m)  Wt 170 lb (77.111 kg)  BMI 25.47 kg/m2 Last Weight:  Wt Readings from Last 1 Encounters:  08/21/13 170 lb (77.111 kg)   Last Height:   Ht Readings from Last 1 Encounters:  08/21/13 5' 8.5" (1.74 m)     Physical exam: Exam: Gen: NAD, conversant Eyes: anicteric sclerae, moist conjunctivae HENT: Atraumatic, oropharynx clear Neck: Trachea midline; supple,  Lungs: CTA, no wheezing, rales, rhonic                          CV: RRR, no  MRG Abdomen: Soft, non-tender;  Extremities: No peripheral edema  Skin: Normal temperature, no rash,  Psych: Appropriate affect, pleasant  Neuro: MS: AA&Ox3, appropriately interactive, normal affect   Attention: WORLD backwards  Speech: fluent w/o paraphasic error  Memory: good recent and remote recall  CN: PERRL, VFF to FC bilat, EOMI no nystagmus, no ptosis, sensation intact to LT V1-V3 bilat, face symmetric, no weakness, hearing grossly intact, palate elevates symmetrically, shoulder shrug 5/5 bilat,  tongue protrudes midline, no fasiculations noted.  Motor: normal bulk and tone Strength: 5/5  In all extremities  Coord: rapid alternating and point-to-point (FNF, HTS) movements intact.  Reflexes: symmetrical, bilat downgoing toes  Sens: LT intact in all extremities  Gait: posture, stance, stride and  arm-swing normal. Tandem gait intact. Able to walk on heels and toes. Romberg absent.   Assessment:  After physical and neurologic examination, review of laboratory studies, imaging, neurophysiology testing and pre-existing records, assessment will be reviewed on the problem list.  Plan:  Treatment plan and additional workup will be reviewed under Problem List.  1)headache  62y/o gentleman presenting for initial evaluation of headache associated with sexual intercourse. Differential would include benign sexual headache vs possible vascular process. Based on clinical description of slow progressive onset with a dull pressure pain would suspect this is likely a benign process. Will check MRA head to rule out vascular process. Patient counseled to try taking ibuprofen 30-60 minutes prior to intercourse. If no improvement would consider Indomethacin or Imitrex if warranted. Follow up once MRA completed.    Jim Like, DO  [facility] [address] [place], [place] [address]  Phone [phone] Fax [phone]

## 2013-08-27 ENCOUNTER — Ambulatory Visit (INDEPENDENT_AMBULATORY_CARE_PROVIDER_SITE_OTHER): Payer: 59

## 2013-08-27 DIAGNOSIS — R51 Headache: Secondary | ICD-10-CM

## 2013-09-01 NOTE — Progress Notes (Signed)
Quick Note:  Informed that MRA head per Dr Hazle Quant findings was normal, patient verbalized understanding ______

## 2014-02-02 ENCOUNTER — Ambulatory Visit (INDEPENDENT_AMBULATORY_CARE_PROVIDER_SITE_OTHER): Payer: 59 | Admitting: Family Medicine

## 2014-02-02 ENCOUNTER — Encounter: Payer: Self-pay | Admitting: Family Medicine

## 2014-02-02 VITALS — BP 126/85 | HR 75 | Ht 68.0 in | Wt 160.0 lb

## 2014-02-02 DIAGNOSIS — M7711 Lateral epicondylitis, right elbow: Secondary | ICD-10-CM

## 2014-02-02 DIAGNOSIS — M7710 Lateral epicondylitis, unspecified elbow: Secondary | ICD-10-CM

## 2014-02-02 NOTE — Patient Instructions (Signed)
You have lateral epicondylitis Try to avoid painful activities as much as possible. Ice the area 3-4 times a day for 15 minutes at a time. Tylenol or aleve as needed for pain. Counterforce brace as directed can help unload area - wear this regularly if it provides you with relief. Home Pronation/supination with 1 pound weight, wrist extension, stretching - do these once a day 3 sets of 10. Consider formal physical therapy. Consider injection for short term pain relief if the above is not helping. Consider nitro patches. Follow up with me in 6 weeks for reevaluation.

## 2014-02-04 ENCOUNTER — Encounter: Payer: Self-pay | Admitting: Family Medicine

## 2014-02-04 NOTE — Progress Notes (Signed)
Patient ID: Randall Scott, male   DOB: 02-28-1951, 63 y.o.   MRN: [id number]  PCP: Lamar Blinks, MD  Subjective:   HPI: Patient is a 63 y.o. male here for right elbow pain.  03/31/13: Patient reports prior history of tennis elbow over 15 years ago. States current pain started about 5 months ago lateral elbow. Pain when picking items up including coffee cup. Is right handed. Remotely tried a counterforce brace - not much help but injection helped a lot. No numbness/tingling.  02/03/14: Patient reports he had done really well up until past couple months. Pain lateral elbow again though not as severe as last time. Hard to hold items, straighten out elbow. Pain comes and goes. No numbness/tingling.  Past Medical History  Diagnosis Date  . Allergy   . Asthma     Current Outpatient Prescriptions on File Prior to Visit  Medication Sig Dispense Refill  . aspirin 81 MG tablet Take 81 mg by mouth daily.       No current facility-administered medications on file prior to visit.    Past Surgical History  Procedure Laterality Date  . Hernia repair      No Known Allergies  History   Social History  . Marital Status: Married    Spouse Name: Caren Griffins    Number of Children: 2  . Years of Education: 12   Occupational History  . Machinist    Social History Main Topics  . Smoking status: Never Smoker   . Smokeless tobacco: Never Used  . Alcohol Use: No  . Drug Use: No  . Sexual Activity: Yes    Birth Control/ Protection: None     Comment: married   Other Topics Concern  . Not on file   Social History Narrative   Lives with his wife.  Daughters are grown and live nearby.   Patient is right handed   Education level is 12   Caffeine consumption is 2-3 cups daily    Family History  Problem Relation Age of Onset  . Lung cancer Mother   . Lung cancer Father   . Sudden death Neg Hx   . Hypertension Neg Hx   . Hyperlipidemia Neg Hx   . Heart attack Neg Hx   . Diabetes  Neg Hx     BP 126/85  Pulse 75  Ht 5\' 8"  (1.727 m)  Wt 160 lb (72.576 kg)  BMI 24.33 kg/m2  Review of Systems: See HPI above.    Objective:  Physical Exam:  Gen: NAD  Right elbow: No gross deformity, swelling, bruising. TTP lateral epicondyle.  No other TTP about elbow. FROM elbow and wrist - pain with supination, wrist extension, 3rd digit extension. Collateral ligaments intact. NVI distally.    Assessment & Plan:  1. Right lateral epicondylitis - Discussed his options as this has recurred.  Declined injection for now - did well with this and HEP in past.  Will start home exercises, new counterforce brace.  Meloxicam, icing, tylenol.  Consider PT, nitro patches, injection if not improving.  F/u in 5-6 weeks.

## 2014-02-04 NOTE — Assessment & Plan Note (Signed)
Discussed his options as this has recurred.  Declined injection for now - did well with this and HEP in past.  Will start home exercises, new counterforce brace.  Meloxicam, icing, tylenol.  Consider PT, nitro patches, injection if not improving.  F/u in 5-6 weeks.

## 2014-03-16 ENCOUNTER — Ambulatory Visit: Payer: 59 | Admitting: Family Medicine

## 2014-04-15 ENCOUNTER — Ambulatory Visit: Payer: 59 | Admitting: Family Medicine

## 2014-04-21 ENCOUNTER — Ambulatory Visit (INDEPENDENT_AMBULATORY_CARE_PROVIDER_SITE_OTHER): Payer: 59 | Admitting: Family Medicine

## 2014-04-21 VITALS — BP 110/72 | HR 66 | Temp 98.5°F | Resp 18 | Ht 68.0 in | Wt 169.0 lb

## 2014-04-21 DIAGNOSIS — Z13220 Encounter for screening for lipoid disorders: Secondary | ICD-10-CM

## 2014-04-21 DIAGNOSIS — Z125 Encounter for screening for malignant neoplasm of prostate: Secondary | ICD-10-CM

## 2014-04-21 DIAGNOSIS — D7589 Other specified diseases of blood and blood-forming organs: Secondary | ICD-10-CM

## 2014-04-21 DIAGNOSIS — Z131 Encounter for screening for diabetes mellitus: Secondary | ICD-10-CM

## 2014-04-21 DIAGNOSIS — Z23 Encounter for immunization: Secondary | ICD-10-CM

## 2014-04-21 DIAGNOSIS — Z0000 Encounter for general adult medical examination without abnormal findings: Secondary | ICD-10-CM

## 2014-04-21 LAB — CBC
HEMATOCRIT: 45.2 % (ref 39.0–52.0)
Hemoglobin: 15.8 g/dL (ref 13.0–17.0)
MCH: 33 pg (ref 26.0–34.0)
MCHC: 35 g/dL (ref 30.0–36.0)
MCV: 94.4 fL (ref 78.0–100.0)
PLATELETS: 227 10*3/uL (ref 150–400)
RBC: 4.79 MIL/uL (ref 4.22–5.81)
RDW: 13.1 % (ref 11.5–15.5)
WBC: 7 10*3/uL (ref 4.0–10.5)

## 2014-04-21 NOTE — Patient Instructions (Signed)
I will be in touch with your labs asap- I'll send you a letter.  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  If you have not already had your shingles shot you can get this at your convenience.  You will be due for your pneumonia vaccines when you turn 65

## 2014-04-21 NOTE — Progress Notes (Signed)
[facility] [address], [place] [address] [phone]  Date:  04/21/2014   Name:  Randall Scott   DOB:  1951-05-06   MRN:  [id number]  PCP:  Lamar Blinks, MD    Chief Complaint: Annual Exam   History of Present Illness:  ARICK MARENO is a 63 y.o. very pleasant male patient who presents with the following:  He is here today for a CPE.  He is fasting today and would like to get a flu shot.  His labs were done last in 12/2012; all looked good.   Colonoscopy is UTD.   He walks with his wife frequently, and does lift weights at home some of the time. He is recently retired and does note that it is harder to control his weight since he no longer works. However he is enjoying his retirement especially as his children and grandchildren live nearby.    He is a non- smoker.  He does not drink to excess   Patient Active Problem List   Diagnosis Date Noted  . Headache(784.0) 08/21/2013  . Lateral epicondylitis of right elbow 04/01/2013    Past Medical History  Diagnosis Date  . Allergy   . Asthma   . Cataract     Past Surgical History  Procedure Laterality Date  . Hernia repair    . Eye surgery      History  Substance Use Topics  . Smoking status: Never Smoker   . Smokeless tobacco: Never Used  . Alcohol Use: No    Family History  Problem Relation Age of Onset  . Lung cancer Mother   . Lung cancer Father   . Sudden death Neg Hx   . Hypertension Neg Hx   . Hyperlipidemia Neg Hx   . Heart attack Neg Hx   . Diabetes Neg Hx     No Known Allergies  Medication list has been reviewed and updated.  Current Outpatient Prescriptions on File Prior to Visit  Medication Sig Dispense Refill  . aspirin 81 MG tablet Take 81 mg by mouth daily.       No current facility-administered medications on file prior to visit.    Review of Systems:  As per HPI- otherwise negative.   Physical Examination: Filed Vitals:   04/21/14 0834  BP: 110/72   Pulse: 66  Temp: 98.5 F (36.9 C)  Resp: 18   Filed Vitals:   04/21/14 0834  Height: 5\' 8"  (1.727 m)  Weight: 169 lb (76.658 kg)   Body mass index is 25.7 kg/(m^2). Ideal Body Weight: Weight in (lb) to have BMI = 25: 164.1  GEN: WDWN, NAD, Non-toxic, A & O x 3, looks well HEENT: Atraumatic, Normocephalic. Neck supple. No masses, No LAD.  Bilateral TM wnl, oropharynx normal.  PEERL,EOMI.   Ears and Nose: No external deformity. CV: RRR, No M/G/R. No JVD. No thrill. No extra heart sounds. PULM: CTA B, no wheezes, crackles, rhonchi. No retractions. No resp. distress. No accessory muscle use. ABD: S, NT, ND. No rebound. No HSM. EXTR: No c/c/e NEURO Normal gait.  PSYCH: Normally interactive. Conversant. Not depressed or anxious appearing.  Calm demeanor.  Gu: normal exam of testes, penis and prostate  Assessment and Plan: Physical exam - Plan: Flu Vaccine QUAD 36+ mos IM  Screening for hyperlipidemia - Plan: Lipid panel  Screening for diabetes mellitus - Plan: Comprehensive metabolic panel  Screening for prostate cancer - Plan: PSA  Macrocytosis - Plan: CBC  Healthy male here for a CPE.  He is doing very well.  Check labs, flu shot.  Will follow-up pending labs   Signed Lamar Blinks, MD

## 2014-04-28 ENCOUNTER — Telehealth: Payer: Self-pay | Admitting: *Deleted

## 2014-04-28 NOTE — Telephone Encounter (Signed)
Pt needs to come back in for fasting labs no charge.  Need to collect from 04/21/14, CMET, Lipid and PSA.  LMOM to cb

## 2014-04-29 ENCOUNTER — Other Ambulatory Visit (INDEPENDENT_AMBULATORY_CARE_PROVIDER_SITE_OTHER): Payer: 59 | Admitting: *Deleted

## 2014-04-29 DIAGNOSIS — Z125 Encounter for screening for malignant neoplasm of prostate: Secondary | ICD-10-CM

## 2014-04-29 DIAGNOSIS — Z131 Encounter for screening for diabetes mellitus: Secondary | ICD-10-CM

## 2014-04-29 DIAGNOSIS — Z13220 Encounter for screening for lipoid disorders: Secondary | ICD-10-CM

## 2014-04-29 NOTE — Telephone Encounter (Signed)
Pt will return for blood draw

## 2014-04-29 NOTE — Progress Notes (Signed)
Pt here for lab draw only  

## 2014-04-30 ENCOUNTER — Encounter: Payer: Self-pay | Admitting: Family Medicine

## 2014-04-30 LAB — LIPID PANEL
CHOL/HDL RATIO: 2.8 ratio
Cholesterol: 141 mg/dL (ref 0–200)
HDL: 51 mg/dL (ref >39)
LDL CALC: 80 mg/dL (ref 0–99)
TRIGLYCERIDES: 48 mg/dL (ref <150)
VLDL: 10 mg/dL (ref 0–40)

## 2014-04-30 LAB — COMPREHENSIVE METABOLIC PANEL
ALT: 11 U/L (ref 0–53)
AST: 16 U/L (ref 0–37)
Albumin: 4.4 g/dL (ref 3.5–5.2)
Alkaline Phosphatase: 52 U/L (ref 39–117)
BILIRUBIN TOTAL: 0.6 mg/dL (ref 0.2–1.2)
BUN: 13 mg/dL (ref 6–23)
CALCIUM: 9.1 mg/dL (ref 8.4–10.5)
CHLORIDE: 106 meq/L (ref 96–112)
CO2: 25 mEq/L (ref 19–32)
CREATININE: 1.12 mg/dL (ref 0.50–1.35)
Glucose, Bld: 95 mg/dL (ref 70–99)
Potassium: 4.4 mEq/L (ref 3.5–5.3)
Sodium: 141 mEq/L (ref 135–145)
Total Protein: 6.6 g/dL (ref 6.0–8.3)

## 2014-04-30 LAB — PSA: PSA: 1.78 ng/mL (ref <=4.00)

## 2014-05-12 ENCOUNTER — Ambulatory Visit: Payer: 59 | Admitting: Family Medicine

## 2014-11-18 ENCOUNTER — Ambulatory Visit (INDEPENDENT_AMBULATORY_CARE_PROVIDER_SITE_OTHER): Payer: BC Managed Care – PPO | Admitting: Family Medicine

## 2014-11-18 VITALS — BP 118/70 | HR 64 | Temp 97.7°F | Resp 16 | Ht 68.0 in | Wt 177.0 lb

## 2014-11-18 DIAGNOSIS — M791 Myalgia, unspecified site: Secondary | ICD-10-CM

## 2014-11-18 DIAGNOSIS — M2560 Stiffness of unspecified joint, not elsewhere classified: Secondary | ICD-10-CM

## 2014-11-18 LAB — CBC
HCT: 46.6 % (ref 39.0–52.0)
Hemoglobin: 16.2 g/dL (ref 13.0–17.0)
MCH: 32.2 pg (ref 26.0–34.0)
MCHC: 34.8 g/dL (ref 30.0–36.0)
MCV: 92.6 fL (ref 78.0–100.0)
MPV: 9 fL (ref 8.6–12.4)
PLATELETS: 220 10*3/uL (ref 150–400)
RBC: 5.03 MIL/uL (ref 4.22–5.81)
RDW: 13.6 % (ref 11.5–15.5)
WBC: 7 10*3/uL (ref 4.0–10.5)

## 2014-11-18 LAB — RHEUMATOID FACTOR: Rhuematoid fact SerPl-aCnc: <10 IU/mL (ref <=14)

## 2014-11-18 LAB — CK: Total CK: 76 U/L (ref 7–232)

## 2014-11-18 LAB — C-REACTIVE PROTEIN: CRP: <0.5 mg/dL (ref <0.60)

## 2014-11-18 MED ORDER — METHOCARBAMOL 500 MG PO TABS: 500.0000 mg | ORAL_TABLET | Freq: Three times a day (TID) | ORAL | Status: DC | PRN

## 2014-11-18 MED ORDER — MELOXICAM 7.5 MG PO TABS: 7.5000 mg | ORAL_TABLET | Freq: Every day | ORAL | Status: DC

## 2014-11-18 NOTE — Progress Notes (Addendum)
[facility] [address] [place] [address] [phone]  Date:  11/18/2014   Name:  Randall Scott   DOB:  11/23/1950   MRN:  [id number]  PCP:  Lamar Blinks, MD    Chief Complaint: Neck Pain; Back Pain; and Leg Pain   History of Present Illness:  Randall Scott is a 65 y.o. very pleasant male patient who presents with the following:  He has a long history of lower back trouble- onset in his 49s.  It has generally been a small, intermittent bother.  However he recently notes a different problem of a stiff feeling from his neck to his knees.  This has gone on for about one month.   Approx one year ago he took prednisone for back pain (from another office) and it did help.  He had a refill left over ad took a course about 3 weeks ago- this did help but sx returned once he finished the medication  He notes more stiffness in the morning and after sitting for a time. Moving around will help him to loosen up; however he gets worse the day after doing any more strenuous physical activity.  He and his wife take an hour walk for exercise a few times a week and he is still doing this  He is able to "loosen up" within a few minutes in the morning as far as his legs and back.  However his neck really feels stiff all the time  He has tried aleve-he does not think that this really helps him.    He is not aware of any family history of arthritis or any unusual MSK conditions.  He does not have any of the pain in his hands Pain does not radiate down the sciatic distrubution, no numbness  Patient Active Problem List   Diagnosis Date Noted  . Headache(784.0) 08/21/2013  . Lateral epicondylitis of right elbow 04/01/2013    Past Medical History  Diagnosis Date  . Allergy   . Asthma   . Cataract     Past Surgical History  Procedure Laterality Date  . Hernia repair    . Eye surgery      History  Substance Use Topics  . Smoking status: Never Smoker   . Smokeless  tobacco: Never Used  . Alcohol Use: No    Family History  Problem Relation Age of Onset  . Lung cancer Mother   . Lung cancer Father   . Sudden death Neg Hx   . Hypertension Neg Hx   . Hyperlipidemia Neg Hx   . Heart attack Neg Hx   . Diabetes Neg Hx     No Known Allergies  Medication list has been reviewed and updated.  Current Outpatient Prescriptions on File Prior to Visit  Medication Sig Dispense Refill  . aspirin 81 MG tablet Take 81 mg by mouth daily.     No current facility-administered medications on file prior to visit.    Review of Systems:  As per HPI- otherwise negative.   Physical Examination: Filed Vitals:   11/18/14 1311  BP: 118/70  Pulse: 64  Temp: 97.7 F (36.5 C)  Resp: 16   Filed Vitals:   11/18/14 1311  Height: 5\' 8"  (1.727 m)  Weight: 177 lb (80.287 kg)   Body mass index is 26.92 kg/(m^2). Ideal Body Weight: Weight in (lb) to have BMI = 25: 164.1  GEN: WDWN, NAD, Non-toxic, A & O x 3, looks well  and is in good physical condition for age 64: Atraumatic, Normocephalic. Neck supple. No masses, No LAD. Ears and Nose: No external deformity. CV: RRR, No M/G/R. No JVD. No thrill. No extra heart sounds. PULM: CTA B, no wheezes, crackles, rhonchi. No retractions. No resp. distress. No accessory muscle use. ABD: S, NT, ND, +BS. No rebound. No HSM. EXTR: No c/c/e NEURO Normal gait.  PSYCH: Normally interactive. Conversant. Not depressed or anxious appearing.  Calm demeanor.  Mild stiffness of neck and thoracic/ lumbar spine with flexion Normal strength, DTR of all extremities.  Negative SLR bilaterally No hand stiffness or nodules    Assessment and Plan: Stiffness of joints, multiple sites - Plan: CBC, Sedimentation Rate, C-reactive protein, methocarbamol (ROBAXIN) 500 MG tablet, meloxicam (MOBIC) 7.5 MG tablet  Muscle pain - Plan: Rheumatoid factor, CK  Wonder if this pt may have ankylosing spondylitis. Await labs as above and then  will plan next step mobic for pain, robaxin for stiffness  Signed Lamar Blinks, MD  Called 5/21:   Results for orders placed or performed in visit on 11/18/14  CBC  Result Value Ref Range   WBC 7.0 4.0 - 10.5 K/uL   RBC 5.03 4.22 - 5.81 MIL/uL   Hemoglobin 16.2 13.0 - 17.0 g/dL   HCT 46.6 39.0 - 52.0 %   MCV 92.6 78.0 - 100.0 fL   MCH 32.2 26.0 - 34.0 pg   MCHC 34.8 30.0 - 36.0 g/dL   RDW 13.6 11.5 - 15.5 %   Platelets 220 150 - 400 K/uL   MPV 9.0 8.6 - 12.4 fL  Sedimentation Rate  Result Value Ref Range   Sed Rate 4 0 - 20 mm/hr  C-reactive protein  Result Value Ref Range   CRP <0.5 <0.60 mg/dL  Rheumatoid factor  Result Value Ref Range   Rhuematoid fact SerPl-aCnc <10 <=14 IU/mL  CK  Result Value Ref Range   Total CK 76 7 - 232 U/L   Labs are all normal which is good news.   LMOM- please let me know how he is doing. Would he like me to refer to ortho/ rheum?  Let me know how he is doing

## 2014-11-18 NOTE — Patient Instructions (Signed)
I will be in touch with the rest of your labs asap For the time being, use the mobic in the morning and robaxin at night. You can also take robaxin (muscle relaxer) during the day but it may make you a little bit sleepy Let me know if there is any change in your symptoms .

## 2014-11-19 LAB — SEDIMENTATION RATE: Sed Rate: 4 mm/hr (ref 0–20)

## 2014-11-21 ENCOUNTER — Encounter: Payer: Self-pay | Admitting: Family Medicine

## 2015-01-25 ENCOUNTER — Telehealth: Payer: Self-pay

## 2015-01-25 DIAGNOSIS — M2550 Pain in unspecified joint: Secondary | ICD-10-CM

## 2015-01-25 NOTE — Telephone Encounter (Signed)
Sure, did referral for him.  Called and let him know- he will let me know if he needs anything further

## 2015-01-25 NOTE — Telephone Encounter (Signed)
Can we refer? 

## 2015-01-25 NOTE — Telephone Encounter (Signed)
Patient requesting Dr copland to refer him to a Rheumatologist. Patients call back number is [phone]

## 2015-03-25 ENCOUNTER — Encounter: Payer: Self-pay | Admitting: Family Medicine

## 2015-03-25 DIAGNOSIS — M47819 Spondylosis without myelopathy or radiculopathy, site unspecified: Secondary | ICD-10-CM | POA: Insufficient documentation

## 2015-04-27 LAB — CBC AND DIFFERENTIAL
HCT: 43 % (ref 41–53)
HEMOGLOBIN: 15.2 g/dL (ref 13.5–17.5)
PLATELETS: 215 10*3/uL (ref 150–399)
WBC: 7.6 10^3/mL

## 2015-05-10 ENCOUNTER — Ambulatory Visit (INDEPENDENT_AMBULATORY_CARE_PROVIDER_SITE_OTHER): Payer: BC Managed Care – PPO | Admitting: Family Medicine

## 2015-05-10 VITALS — BP 112/64 | HR 58 | Temp 98.0°F | Resp 14 | Ht 68.0 in | Wt 169.0 lb

## 2015-05-10 DIAGNOSIS — Z119 Encounter for screening for infectious and parasitic diseases, unspecified: Secondary | ICD-10-CM | POA: Diagnosis not present

## 2015-05-10 DIAGNOSIS — Z13220 Encounter for screening for lipoid disorders: Secondary | ICD-10-CM | POA: Diagnosis not present

## 2015-05-10 DIAGNOSIS — Z131 Encounter for screening for diabetes mellitus: Secondary | ICD-10-CM | POA: Diagnosis not present

## 2015-05-10 DIAGNOSIS — Z0000 Encounter for general adult medical examination without abnormal findings: Secondary | ICD-10-CM | POA: Diagnosis not present

## 2015-05-10 DIAGNOSIS — Z130 Encounter for screening for diseases of the blood and blood-forming organs and certain disorders involving the immune mechanism: Secondary | ICD-10-CM | POA: Diagnosis not present

## 2015-05-10 DIAGNOSIS — Z125 Encounter for screening for malignant neoplasm of prostate: Secondary | ICD-10-CM

## 2015-05-10 DIAGNOSIS — Z23 Encounter for immunization: Secondary | ICD-10-CM

## 2015-05-10 LAB — CBC
HCT: 44.7 % (ref 39.0–52.0)
HEMOGLOBIN: 15.7 g/dL (ref 13.0–17.0)
MCH: 34.1 pg — AB (ref 26.0–34.0)
MCHC: 35.1 g/dL (ref 30.0–36.0)
MCV: 97.2 fL (ref 78.0–100.0)
MPV: 8.9 fL (ref 8.6–12.4)
Platelets: 224 10*3/uL (ref 150–400)
RBC: 4.6 MIL/uL (ref 4.22–5.81)
RDW: 14.8 % (ref 11.5–15.5)
WBC: 7.4 10*3/uL (ref 4.0–10.5)

## 2015-05-10 LAB — COMPREHENSIVE METABOLIC PANEL
ALBUMIN: 4.1 g/dL (ref 3.6–5.1)
ALT: 14 U/L (ref 9–46)
AST: 17 U/L (ref 10–35)
Alkaline Phosphatase: 48 U/L (ref 40–115)
BUN: 18 mg/dL (ref 7–25)
CHLORIDE: 103 mmol/L (ref 98–110)
CO2: 29 mmol/L (ref 20–31)
Calcium: 9 mg/dL (ref 8.6–10.3)
Creat: 1.11 mg/dL (ref 0.70–1.25)
Glucose, Bld: 82 mg/dL (ref 65–99)
POTASSIUM: 4.2 mmol/L (ref 3.5–5.3)
SODIUM: 142 mmol/L (ref 135–146)
Total Bilirubin: 0.8 mg/dL (ref 0.2–1.2)
Total Protein: 6.7 g/dL (ref 6.1–8.1)

## 2015-05-10 LAB — LIPID PANEL
CHOL/HDL RATIO: 2.4 ratio (ref <=5.0)
Cholesterol: 145 mg/dL (ref 125–200)
HDL: 61 mg/dL (ref >=40)
LDL CALC: 70 mg/dL (ref <130)
TRIGLYCERIDES: 71 mg/dL (ref <150)
VLDL: 14 mg/dL (ref <30)

## 2015-05-10 LAB — HEMOGLOBIN A1C
HEMOGLOBIN A1C: 5.8 % — AB (ref <5.7)
Mean Plasma Glucose: 120 mg/dL — ABNORMAL HIGH (ref <117)

## 2015-05-10 LAB — HIV ANTIBODY (ROUTINE TESTING W REFLEX): HIV 1&2 Ab, 4th Generation: NONREACTIVE

## 2015-05-10 LAB — HEPATITIS C ANTIBODY: HCV Ab: NEGATIVE

## 2015-05-10 NOTE — Patient Instructions (Signed)
It was great to see you today!  Take care and I will be in touch with your labs asap Your blood pressure looks great  I am so glad that your arthritis is improved

## 2015-05-10 NOTE — Progress Notes (Signed)
[facility] [address] [place] [address] [phone]  Date:  05/10/2015   Name:  Randall Scott   DOB:  12-15-1950   MRN:  [id number]  PCP:  Lamar Blinks, MD    Chief Complaint: Annual Exam and Immunizations   History of Present Illness:  Randall Scott is a 64 y.o. very pleasant male patient who presents with the following:  Here today for a CPE-doing well today Tetanus UTD, colonoscopy UTD Flu shot today He saw Erin at [facility] and received dx of spondyloarthropathy. He is taking weekly methotrexate and 5mg  of prednisone; they plan to taper off of prednisone soon.  He does feel like his back and joints are much better- he is less stiff and able to get around better in the mornings.   Last labs a year ago- looked fine.    He is fasting today for labs Patient Active Problem List   Diagnosis Date Noted  . 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 Headache(784.0) 08/21/2013  . Lateral epicondylitis of right elbow 04/01/2013    Past Medical History  Diagnosis Date  . Allergy   . Asthma   . Cataract   . Arthritis     Past Surgical History  Procedure Laterality Date  . Hernia repair    . Eye surgery      Social History  Substance Use Topics  . Smoking status: Never Smoker   . Smokeless tobacco: Never Used  . Alcohol Use: No    Family History  Problem Relation Age of Onset  . Lung cancer Mother   . Lung cancer Father   . Sudden death Neg Hx   . Hypertension Neg Hx   . Hyperlipidemia Neg Hx   . Heart attack Neg Hx   . Diabetes Neg Hx     No Known Allergies  Medication list has been reviewed and updated.  Current Outpatient Prescriptions on File Prior to Visit  Medication Sig Dispense Refill  . aspirin 81 MG tablet Take 81 mg by mouth daily.    . meloxicam (MOBIC) 7.5 MG tablet Take 1 tablet (7.5 mg total) by mouth daily. (Patient not taking: Reported on 05/10/2015) 30 tablet 0  . methocarbamol (ROBAXIN) 500 MG tablet Take 1  tablet (500 mg total) by mouth every 8 (eight) hours as needed. (Patient not taking: Reported on 05/10/2015) 30 tablet 0   No current facility-administered medications on file prior to visit.    Review of Systems:  As per HPI- otherwise negative.   Physical Examination: Filed Vitals:   05/10/15 0820  BP: 112/64  Pulse: 58  Temp: 98 F (36.7 C)  Resp: 14   Filed Vitals:   05/10/15 0820  Height: 5\' 8"  (1.727 m)  Weight: 169 lb (76.658 kg)   Body mass index is 25.7 kg/(m^2). Ideal Body Weight: Weight in (lb) to have BMI = 25: 164.1  GEN: WDWN, NAD, Non-toxic, A & O x 3, looks well, normal weight HEENT: Atraumatic, Normocephalic. Neck supple. No masses, No LAD.  Bilateral TM wnl, oropharynx normal.  PEERL,EOMI.   Ears and Nose: No external deformity. CV: RRR, No M/G/R. No JVD. No thrill. No extra heart sounds. PULM: CTA B, no wheezes, crackles, rhonchi. No retractions. No resp. distress. No accessory muscle use. ABD: S, NT, ND, +BS. No rebound. No HSM. EXTR: No c/c/e NEURO Normal gait.  PSYCH: Normally interactive. Conversant. Not depressed or anxious appearing.  Calm demeanor.  Gu: normal penis and testes. Normal  DRE  Assessment and Plan: Physical exam  Screening examination for infectious disease - Plan: Hepatitis C antibody, HIV antibody  Screening for diabetes mellitus - Plan: Comprehensive metabolic panel, Hemoglobin A1c  Screening for prostate cancer - Plan: PSA  Screening for deficiency anemia - Plan: CBC  Immunization due  Screening for hyperlipidemia - Plan: Lipid panel  CPE today Doing very well Flu shot today and labs He is seeing rheum and his joint pains are much better with current therapy  Signed Lamar Blinks, MD

## 2015-05-11 ENCOUNTER — Encounter: Payer: Self-pay | Admitting: Family Medicine

## 2015-05-11 LAB — PSA: PSA: 1.4 ng/mL (ref <=4.00)

## 2015-07-23 ENCOUNTER — Encounter: Payer: Self-pay | Admitting: Family Medicine

## 2015-07-28 ENCOUNTER — Encounter: Payer: Self-pay | Admitting: Family Medicine

## 2015-09-03 DIAGNOSIS — M2550 Pain in unspecified joint: Secondary | ICD-10-CM | POA: Diagnosis not present

## 2015-10-26 DIAGNOSIS — M4690 Unspecified inflammatory spondylopathy, site unspecified: Secondary | ICD-10-CM | POA: Diagnosis not present

## 2015-10-26 DIAGNOSIS — Z9229 Personal history of other drug therapy: Secondary | ICD-10-CM | POA: Diagnosis not present

## 2015-10-26 DIAGNOSIS — M542 Cervicalgia: Secondary | ICD-10-CM | POA: Diagnosis not present

## 2015-10-26 DIAGNOSIS — R5383 Other fatigue: Secondary | ICD-10-CM | POA: Diagnosis not present

## 2015-10-26 DIAGNOSIS — Z79899 Other long term (current) drug therapy: Secondary | ICD-10-CM | POA: Diagnosis not present

## 2015-10-26 DIAGNOSIS — M545 Low back pain: Secondary | ICD-10-CM | POA: Diagnosis not present

## 2015-10-26 DIAGNOSIS — M2550 Pain in unspecified joint: Secondary | ICD-10-CM | POA: Diagnosis not present

## 2015-12-13 DIAGNOSIS — R05 Cough: Secondary | ICD-10-CM | POA: Diagnosis not present

## 2015-12-13 DIAGNOSIS — J019 Acute sinusitis, unspecified: Secondary | ICD-10-CM | POA: Diagnosis not present

## 2015-12-25 DIAGNOSIS — J40 Bronchitis, not specified as acute or chronic: Secondary | ICD-10-CM | POA: Diagnosis not present

## 2015-12-31 DIAGNOSIS — H5203 Hypermetropia, bilateral: Secondary | ICD-10-CM | POA: Diagnosis not present

## 2016-01-07 DIAGNOSIS — J0141 Acute recurrent pansinusitis: Secondary | ICD-10-CM | POA: Diagnosis not present

## 2016-01-07 DIAGNOSIS — J060 Acute laryngopharyngitis: Secondary | ICD-10-CM | POA: Diagnosis not present

## 2016-01-07 DIAGNOSIS — R300 Dysuria: Secondary | ICD-10-CM | POA: Diagnosis not present

## 2016-01-07 DIAGNOSIS — R05 Cough: Secondary | ICD-10-CM | POA: Diagnosis not present

## 2016-01-07 DIAGNOSIS — J40 Bronchitis, not specified as acute or chronic: Secondary | ICD-10-CM | POA: Diagnosis not present

## 2016-01-07 DIAGNOSIS — R39198 Other difficulties with micturition: Secondary | ICD-10-CM | POA: Diagnosis not present

## 2016-01-08 DIAGNOSIS — J40 Bronchitis, not specified as acute or chronic: Secondary | ICD-10-CM | POA: Diagnosis not present

## 2016-02-01 DIAGNOSIS — M2550 Pain in unspecified joint: Secondary | ICD-10-CM | POA: Diagnosis not present

## 2016-02-01 DIAGNOSIS — M4690 Unspecified inflammatory spondylopathy, site unspecified: Secondary | ICD-10-CM | POA: Diagnosis not present

## 2016-02-01 DIAGNOSIS — Z1589 Genetic susceptibility to other disease: Secondary | ICD-10-CM | POA: Diagnosis not present

## 2016-02-01 DIAGNOSIS — M542 Cervicalgia: Secondary | ICD-10-CM | POA: Diagnosis not present

## 2016-02-01 DIAGNOSIS — Z79899 Other long term (current) drug therapy: Secondary | ICD-10-CM | POA: Diagnosis not present

## 2016-02-01 DIAGNOSIS — M545 Low back pain: Secondary | ICD-10-CM | POA: Diagnosis not present

## 2016-02-01 LAB — HEPATIC FUNCTION PANEL
ALT: 27 U/L (ref 10–40)
AST: 20 U/L (ref 14–40)
Alkaline Phosphatase: 51 U/L (ref 25–125)
BILIRUBIN, TOTAL: 0.4 mg/dL

## 2016-02-01 LAB — BASIC METABOLIC PANEL
BUN: 18 mg/dL (ref 4–21)
CREATININE: 0.9 mg/dL (ref 0.6–1.3)
GLUCOSE: 88 mg/dL
Potassium: 5.1 mmol/L (ref 3.4–5.3)
SODIUM: 145 mmol/L (ref 137–147)

## 2016-02-01 LAB — CBC AND DIFFERENTIAL
HEMATOCRIT: 40 % — AB (ref 41–53)
Hemoglobin: 13.3 g/dL — AB (ref 13.5–17.5)
Platelets: 150 10*3/uL (ref 150–399)
WBC: 4 10^3/mL

## 2016-02-10 ENCOUNTER — Other Ambulatory Visit: Payer: Self-pay | Admitting: Family Medicine

## 2016-04-06 ENCOUNTER — Ambulatory Visit (INDEPENDENT_AMBULATORY_CARE_PROVIDER_SITE_OTHER): Payer: PPO | Admitting: Family Medicine

## 2016-04-06 ENCOUNTER — Encounter: Payer: Self-pay | Admitting: Family Medicine

## 2016-04-06 VITALS — BP 126/81 | HR 62 | Temp 97.6°F | Ht 68.0 in | Wt 173.6 lb

## 2016-04-06 DIAGNOSIS — Z130 Encounter for screening for diseases of the blood and blood-forming organs and certain disorders involving the immune mechanism: Secondary | ICD-10-CM

## 2016-04-06 DIAGNOSIS — Z131 Encounter for screening for diabetes mellitus: Secondary | ICD-10-CM | POA: Diagnosis not present

## 2016-04-06 DIAGNOSIS — R972 Elevated prostate specific antigen [PSA]: Secondary | ICD-10-CM

## 2016-04-06 DIAGNOSIS — Z125 Encounter for screening for malignant neoplasm of prostate: Secondary | ICD-10-CM | POA: Diagnosis not present

## 2016-04-06 DIAGNOSIS — Z0000 Encounter for general adult medical examination without abnormal findings: Secondary | ICD-10-CM

## 2016-04-06 DIAGNOSIS — Z23 Encounter for immunization: Secondary | ICD-10-CM | POA: Diagnosis not present

## 2016-04-06 DIAGNOSIS — Z13220 Encounter for screening for lipoid disorders: Secondary | ICD-10-CM

## 2016-04-06 LAB — LIPID PANEL
CHOLESTEROL: 144 mg/dL (ref 0–200)
HDL: 54.9 mg/dL (ref >39.00)
LDL Cholesterol: 80 mg/dL (ref 0–99)
NonHDL: 89.26
TRIGLYCERIDES: 47 mg/dL (ref 0.0–149.0)
Total CHOL/HDL Ratio: 3
VLDL: 9.4 mg/dL (ref 0.0–40.0)

## 2016-04-06 LAB — CBC
HCT: 39.5 % (ref 39.0–52.0)
Hemoglobin: 13.3 g/dL (ref 13.0–17.0)
MCHC: 33.6 g/dL (ref 30.0–36.0)
MCV: 103.1 fl — ABNORMAL HIGH (ref 78.0–100.0)
PLATELETS: 275 10*3/uL (ref 150.0–400.0)
RBC: 3.83 Mil/uL — AB (ref 4.22–5.81)
RDW: 15.3 % (ref 11.5–15.5)
WBC: 6.8 10*3/uL (ref 4.0–10.5)

## 2016-04-06 LAB — COMPREHENSIVE METABOLIC PANEL
ALBUMIN: 4.2 g/dL (ref 3.5–5.2)
ALT: 19 U/L (ref 0–53)
AST: 20 U/L (ref 0–37)
Alkaline Phosphatase: 44 U/L (ref 39–117)
BILIRUBIN TOTAL: 0.8 mg/dL (ref 0.2–1.2)
BUN: 16 mg/dL (ref 6–23)
CALCIUM: 9.2 mg/dL (ref 8.4–10.5)
CO2: 29 mEq/L (ref 19–32)
CREATININE: 1.17 mg/dL (ref 0.40–1.50)
Chloride: 107 mEq/L (ref 96–112)
GFR: 66.37 mL/min (ref >60.00)
Glucose, Bld: 93 mg/dL (ref 70–99)
Potassium: 4.3 mEq/L (ref 3.5–5.1)
Sodium: 143 mEq/L (ref 135–145)
TOTAL PROTEIN: 6.4 g/dL (ref 6.0–8.3)

## 2016-04-06 LAB — PSA: PSA: 4.44 ng/mL — AB (ref 0.10–4.00)

## 2016-04-06 NOTE — Patient Instructions (Signed)
It was great to see you today as always! You got your first pneumonia vaccine and your flu shot today. You will need a pneumonia booster next year.  Remember since you are on methotrexate there is a chance the vaccines may not produce full immunity, but I think it is still better to get them since you are on methotrexate long term  I will be in touch with your labs asap Take care!

## 2016-04-06 NOTE — Progress Notes (Addendum)
[facility] [address] [place], [place] [address] [phone]  Date:  04/06/2016   Name:  Randall Scott   DOB:  11-07-1950   MRN:  [id number]  PCP:  Lamar Blinks, MD    Chief Complaint: Annual Exam (Last colonoscopy: 2014. Would like flu vaccine. Possibly due for pneumonia vaccine. )   History of Present Illness:  Randall Scott is a 65 y.o. very pleasant male patient who presents with the following:  Here today for a CPE He is generally in good health except for spondyloarthropathy- he is on methotrexate and has been able to come off prednisone. He takes 8x 2.5 of methotrexate once weekly.  Today is Thursday and he takes his dose on Sunday.  He has been on this medication for about one year now.  He uses naproxen as needed for low back pain and other joint pains He does do PSA checks annually- would like to do this today  He has never been a smoker.   He has noted some bumpiness of the skin around his face recently- it is not itchy but he notes it does not feel as smooth as usual  He did have a bad sinus infection over the summer- in July. He is improved now but still does not feel like his energy level is 100%  Colonoscopy was in 2014 zostavax and flu shot UTD  Wt Readings from Last 3 Encounters:  04/06/16 173 lb 9.6 oz (78.7 kg)  05/10/15 169 lb (76.7 kg)  11/18/14 177 lb (80.3 kg)   He likes to walk for exercise. He does not have any CP with exercise but his hips will hurt and limit his exercise ability.   Family is doing well- I am seeing his wife soon for her CPE  Patient Active Problem List   Diagnosis Date Noted  . Spondyloarthropathy ([place]) 03/25/2015  . Headache(784.0) 08/21/2013  . Lateral epicondylitis of right elbow 04/01/2013    Past Medical History:  Diagnosis Date  . Allergy   . Arthritis   . Asthma   . Cataract     Past Surgical History:  Procedure Laterality Date  . EYE SURGERY     . HERNIA REPAIR      Social History  Substance Use Topics  . Smoking status: Never Smoker  . Smokeless tobacco: Never Used  . Alcohol use No    Family History  Problem Relation Age of Onset  . Lung cancer Mother   . Lung cancer Father   . Sudden death Neg Hx   . Hypertension Neg Hx   . Hyperlipidemia Neg Hx   . Heart attack Neg Hx   . Diabetes Neg Hx     No Known Allergies  Medication list has been reviewed and updated.  Current Outpatient Prescriptions on File Prior to Visit  Medication Sig Dispense Refill  . aspirin 81 MG tablet Take 81 mg by mouth daily.    . Cholecalciferol (VITAMIN D-3 PO) Take by mouth.    . folic acid (FOLVITE) 1 MG tablet Take 1 mg by mouth daily.    . methotrexate 2.5 MG tablet Take by mouth once a week.    . predniSONE (DELTASONE) 5 MG tablet Take 5 mg by mouth daily.     No current facility-administered medications on file prior to visit.     Review of Systems:  As per HPI- otherwise negative. No fever, chills, nausea, vomiting,  or chest pain   Physical Examination: Vitals:   04/06/16 0829  BP: 126/81  Pulse: 62  Temp: 97.6 F (36.4 C)   Vitals:   04/06/16 0829  Weight: 173 lb 9.6 oz (78.7 kg)  Height: 5\' 8"  (1.727 m)   Body mass index is 26.4 kg/m. Ideal Body Weight: Weight in (lb) to have BMI = 25: 164.1  GEN: WDWN, NAD, Non-toxic, A & O x 3, looks well HEENT: Atraumatic, Normocephalic. Neck supple. No masses, No LAD.  Bilateral TM wnl, oropharynx normal.  PEERL,EOMI.    He does have a few papules around his hairline and behind his ears that appear most c/w keratosis pilaris Ears and Nose: No external deformity. CV: RRR, No M/G/R. No JVD. No thrill. No extra heart sounds. PULM: CTA B, no wheezes, crackles, rhonchi. No retractions. No resp. distress. No accessory muscle use. ABD: S, NT, ND EXTR: No c/c/e NEURO Normal gait.  PSYCH: Normally interactive. Conversant. Not depressed or anxious appearing.  Calm demeanor.     Assessment and Plan: Physical exam  Screening for deficiency anemia - Plan: CBC  Screening for prostate cancer - Plan: PSA  Screening for diabetes mellitus - Plan: Comprehensive metabolic panel  Screening for hyperlipidemia - Plan: Lipid panel  Encounter for immunization - Plan: Flu vaccine HIGH DOSE PF  Need for pneumococcal vaccination - Plan: Pneumococcal conjugate vaccine 13-valent  Here today for a CPE He is on methotrexate but this is a long term drug; confirmed with pharmacy that it is ok to give routine vacs such as pneumonia and flu vaccine Await further labs and will be in touch with him asap BP and weight are ok today- he has gained just a few lbs   Signed Lamar Blinks, MD Called pt to discuss labs 10/6 Results for orders placed or performed in visit on 04/06/16  CBC  Result Value Ref Range   WBC 6.8 4.0 - 10.5 K/uL   RBC 3.83 (L) 4.22 - 5.81 Mil/uL   Platelets 275.0 150.0 - 400.0 K/uL   Hemoglobin 13.3 13.0 - 17.0 g/dL   HCT 39.5 39.0 - 52.0 %   MCV 103.1 (H) 78.0 - 100.0 fl   MCHC 33.6 30.0 - 36.0 g/dL   RDW 15.3 11.5 - 15.5 %  Comprehensive metabolic panel  Result Value Ref Range   Sodium 143 135 - 145 mEq/L   Potassium 4.3 3.5 - 5.1 mEq/L   Chloride 107 96 - 112 mEq/L   CO2 29 19 - 32 mEq/L   Glucose, Bld 93 70 - 99 mg/dL   BUN 16 6 - 23 mg/dL   Creatinine, Ser 1.17 0.40 - 1.50 mg/dL   Total Bilirubin 0.8 0.2 - 1.2 mg/dL   Alkaline Phosphatase 44 39 - 117 U/L   AST 20 0 - 37 U/L   ALT 19 0 - 53 U/L   Total Protein 6.4 6.0 - 8.3 g/dL   Albumin 4.2 3.5 - 5.2 g/dL   Calcium 9.2 8.4 - 10.5 mg/dL   GFR 66.37 >60.00 mL/min  Lipid panel  Result Value Ref Range   Cholesterol 144 0 - 200 mg/dL   Triglycerides 47.0 0.0 - 149.0 mg/dL   HDL 54.90 >39.00 mg/dL   VLDL 9.4 0.0 - 40.0 mg/dL   LDL Cholesterol 80 0 - 99 mg/dL   Total CHOL/HDL Ratio 3    NonHDL 89.26   PSA  Result Value Ref Range   PSA 4.44 (H) 0.10 - 4.00 ng/mL  His PSA has gone  up a lot since last year.  Will refer to urology- he understands and will let me know if he does not hear about his appt soon

## 2016-04-06 NOTE — Progress Notes (Signed)
Pre visit review using our clinic review tool, if applicable. No additional management support is needed unless otherwise documented below in the visit note. 

## 2016-04-07 ENCOUNTER — Encounter: Payer: Self-pay | Admitting: Family Medicine

## 2016-04-07 NOTE — Addendum Note (Signed)
Addended by: Lamar Blinks C on: 04/07/2016 05:02 PM   Modules accepted: Orders

## 2016-04-26 DIAGNOSIS — R972 Elevated prostate specific antigen [PSA]: Secondary | ICD-10-CM | POA: Diagnosis not present

## 2016-05-03 DIAGNOSIS — M542 Cervicalgia: Secondary | ICD-10-CM | POA: Diagnosis not present

## 2016-05-03 DIAGNOSIS — M25471 Effusion, right ankle: Secondary | ICD-10-CM | POA: Diagnosis not present

## 2016-05-03 DIAGNOSIS — M545 Low back pain: Secondary | ICD-10-CM | POA: Diagnosis not present

## 2016-05-03 DIAGNOSIS — Z79899 Other long term (current) drug therapy: Secondary | ICD-10-CM | POA: Diagnosis not present

## 2016-05-03 DIAGNOSIS — M4690 Unspecified inflammatory spondylopathy, site unspecified: Secondary | ICD-10-CM | POA: Diagnosis not present

## 2016-05-03 DIAGNOSIS — M2550 Pain in unspecified joint: Secondary | ICD-10-CM | POA: Diagnosis not present

## 2016-05-03 DIAGNOSIS — Z1589 Genetic susceptibility to other disease: Secondary | ICD-10-CM | POA: Diagnosis not present

## 2016-06-12 ENCOUNTER — Telehealth: Payer: Self-pay

## 2016-06-12 ENCOUNTER — Encounter: Payer: Self-pay | Admitting: Family Medicine

## 2016-06-12 ENCOUNTER — Ambulatory Visit (HOSPITAL_BASED_OUTPATIENT_CLINIC_OR_DEPARTMENT_OTHER)
Admission: RE | Admit: 2016-06-12 | Discharge: 2016-06-12 | Disposition: A | Discharge status: Home / Self Care | Payer: PPO | Source: Ambulatory Visit | Attending: Family Medicine | Admitting: Family Medicine

## 2016-06-12 ENCOUNTER — Ambulatory Visit (INDEPENDENT_AMBULATORY_CARE_PROVIDER_SITE_OTHER): Payer: PPO | Admitting: Family Medicine

## 2016-06-12 VITALS — BP 112/84 | HR 79 | Temp 97.6°F | Ht 68.0 in | Wt 177.6 lb

## 2016-06-12 DIAGNOSIS — I82811 Embolism and thrombosis of superficial veins of right lower extremities: Secondary | ICD-10-CM | POA: Diagnosis not present

## 2016-06-12 DIAGNOSIS — I8001 Phlebitis and thrombophlebitis of superficial vessels of right lower extremity: Secondary | ICD-10-CM | POA: Insufficient documentation

## 2016-06-12 DIAGNOSIS — M79661 Pain in right lower leg: Secondary | ICD-10-CM | POA: Diagnosis not present

## 2016-06-12 NOTE — Progress Notes (Signed)
Pre visit review using our clinic review tool, if applicable. No additional management support is needed unless otherwise documented below in the visit note. 

## 2016-06-12 NOTE — Progress Notes (Addendum)
Chief Complaint  Patient presents with  . Leg Pain    Pt reports pain in lower RT ankle and has moved up in the leg from the knee and thigh     Subjective: Patient is a 65 y.o. male here for R leg pain.  Pain started around 1 week ago in his right calf. It progressed to behind his knee and now it is in his right thigh. He describes the pain as an achy feeling. It does not radiate. He is concerned it may be a clot. Denies any numbness, tingling, swelling, bruising, recent travel, surgery, decreased activity, or injury. He also denies shortness of breath, cough, or fevers.   ROS: MSK: +thigh and calf pain Lungs: Denies SOB or cough  Family History  Problem Relation Age of Onset  . Lung cancer Mother   . Lung cancer Father   . Sudden death Neg Hx   . Hypertension Neg Hx   . Hyperlipidemia Neg Hx   . Heart attack Neg Hx   . Diabetes Neg Hx    Past Medical History:  Diagnosis Date  . Allergy   . Arthritis   . Asthma   . Cataract    No Known Allergies  Current Outpatient Prescriptions:  .  aspirin 81 MG tablet, Take 81 mg by mouth daily., Disp: , Rfl:  .  folic acid (FOLVITE) 1 MG tablet, Take 1 mg by mouth daily., Disp: , Rfl:  .  methotrexate 2.5 MG tablet, Take by mouth once a week., Disp: , Rfl:  .  naproxen (NAPROSYN) 500 MG tablet, Take 500 mg by mouth 2 (two) times daily as needed., Disp: , Rfl:  .  Cholecalciferol (VITAMIN D3) 1000 units CAPS, Take 1 capsule by mouth daily., Disp: , Rfl:   Objective: BP 112/84 (BP Location: Left Arm, Patient Position: Sitting, Cuff Size: Small)   Pulse 79   Temp 97.6 F (36.4 C) (Oral)   Ht 5\' 8"  (1.727 m)   Wt 177 lb 9.6 oz (80.6 kg)   SpO2 98%   BMI 27.00 kg/m  General: Awake, appears stated age Heart: RRR, no murmurs, no LE edema Lungs: CTAB, no rales, wheezes or rhonchi. No accessory muscle use MSK: +TTP over the prox R calf and distal R thigh, 5/5 strength throughout, gait is normal. No edema Skin: No ecchymosis or  erythema, I do not appreciate any cord or nodule Neuro: Sensation intact to light touch Psych: Age appropriate judgment and insight, normal affect and mood  Assessment and Plan: Right leg pain - Plan: US Venous Img Lower Unilateral Right  Orders as above. US showed a superficial venous thrombosis in the R great saphenous vein. Does not say this size. I discussed the case with the radiologist who read the study and we came to the conclusion that the thrombus likely does span >5 cm. Will treat for 6 weeks with Xarelto. OK to stay on ASA, stop Naproxen. F/u in 4 weeks to see how he is doing on medication. The patient voiced understanding and agreement to the plan.  [place], DO 06/12/16  11:56 AM

## 2016-06-12 NOTE — Telephone Encounter (Signed)
Imaging called with results of patients [place]. Informed Dr. Nani Ravens and patient sent back up and placed in a room.

## 2016-06-12 NOTE — Telephone Encounter (Signed)
Patient called to follow up on blood clot treatment. Plse call patient.

## 2016-06-12 NOTE — Patient Instructions (Signed)
Xarelto- 6 total weeks 1st 3 weeks: Xarelto 15 mg, twice daily  After 3 weeks, take 20 mg daily  Things to look out for- bleeding! We will see you in 4 weeks to make sure you are feeling OK and doing well on the medication. You can follow up with me or with Dr. Lorelei Pont, whatever is more convenient for you.

## 2016-06-13 DIAGNOSIS — R972 Elevated prostate specific antigen [PSA]: Secondary | ICD-10-CM | POA: Diagnosis not present

## 2016-06-13 NOTE — Telephone Encounter (Signed)
Please advise. TL/CMA 

## 2016-06-14 NOTE — Telephone Encounter (Signed)
See my assessment and plan from 12/11. Pt is aware as he was given a second AVS with instructions. Does he have other questions?

## 2016-06-15 MED ORDER — RIVAROXABAN (XARELTO) VTE STARTER PACK (15 & 20 MG)
ORAL_TABLET | ORAL | 0 refills | Status: DC
Start: 2016-06-15 — End: 2017-02-12

## 2016-06-15 NOTE — Addendum Note (Signed)
Addended by: Rudene Anda on: 06/15/2016 10:20 AM   Modules accepted: Orders

## 2016-06-15 NOTE — Telephone Encounter (Addendum)
Called to follow up with patient.  Pt states he did receive the medication and instructions for taking the medications. States he started taking Xarelto.  Today will be his 3rd day on the medication and everything seems to be going well.  No signs of bleeding and no other complaints at this time.  Pt was encouraged to continue to monitor for signs of bleeding and if he had any questions or concerns to call the office for further instructions.  He stated understanding and was appreciative for the call.    Xarelto was added to patient's medication list.

## 2016-06-22 DIAGNOSIS — L739 Follicular disorder, unspecified: Secondary | ICD-10-CM | POA: Diagnosis not present

## 2016-06-22 DIAGNOSIS — L089 Local infection of the skin and subcutaneous tissue, unspecified: Secondary | ICD-10-CM | POA: Diagnosis not present

## 2016-06-22 DIAGNOSIS — L280 Lichen simplex chronicus: Secondary | ICD-10-CM | POA: Diagnosis not present

## 2016-06-23 DIAGNOSIS — R972 Elevated prostate specific antigen [PSA]: Secondary | ICD-10-CM | POA: Diagnosis not present

## 2016-06-23 DIAGNOSIS — N200 Calculus of kidney: Secondary | ICD-10-CM | POA: Diagnosis not present

## 2016-06-28 ENCOUNTER — Encounter: Payer: Self-pay | Admitting: Family Medicine

## 2016-06-28 DIAGNOSIS — R972 Elevated prostate specific antigen [PSA]: Secondary | ICD-10-CM | POA: Insufficient documentation

## 2016-07-10 ENCOUNTER — Encounter: Payer: Self-pay | Admitting: Family Medicine

## 2016-07-10 ENCOUNTER — Ambulatory Visit (INDEPENDENT_AMBULATORY_CARE_PROVIDER_SITE_OTHER): Payer: PPO | Admitting: Family Medicine

## 2016-07-10 VITALS — BP 102/72 | HR 76 | Temp 98.0°F | Ht 68.0 in | Wt 177.4 lb

## 2016-07-10 DIAGNOSIS — I82811 Embolism and thrombosis of superficial veins of right lower extremities: Secondary | ICD-10-CM

## 2016-07-10 NOTE — Patient Instructions (Signed)
Finish out your 6 weeks of xarelto. Then you can go back on your daily baby aspirin. If you are on another long journey I would increase to a full aspirin, move around frequently and wear compression socks or stockings on your lower legs to help prevent another clot. If you do have another clot we will want to do further blood work and have you on long term anticoagulation Please seek care if any chest pain, shortness of breath or bleeding.

## 2016-07-10 NOTE — Progress Notes (Signed)
Pre visit review using our clinic review tool, if applicable. No additional management support is needed unless otherwise documented below in the visit note. 

## 2016-07-10 NOTE — Progress Notes (Signed)
[place] at [facility] [address] [place], [place] [address] [phone]  Date:  07/10/2016   Name:  Randall Scott   DOB:  1950-09-22   MRN:  [id number]  PCP:  Lamar Blinks, MD    Chief Complaint: Follow-up (Pt here for 4 week f/u per Dr. Nani Ravens on thrombosis of right leg. )   History of Present Illness:  Randall Scott is a 66 y.o. very pleasant male patient who presents with the following:  He was here on 12/11 with a superficial venous thrombosis of right leg- partial plan from Dr. Nani Ravens as follows  US showed a superficial venous thrombosis in the R great saphenous vein. Does not say this size. I discussed the case with the radiologist who read the study and we came to the conclusion that the thrombus likely does span >5 cm. Will treat for 6 weeks with Xarelto. OK to stay on ASA, stop Naproxen. F/u in 4 weeks to see how he is doing on medication.  He is using his xarelto - he is on the xarelto 20 daily.  He will use this for approx another 2 weeks and then be done He has not noted any easy bruising, nose bleeds, blood in stool or urine.   His leg pain is really resolved at this time, he feels like the treatment has worked  He has never had a clot in the past.  He did take a bus tour to [place] in October and was "off an on the bus for about a week,"  Often they did not move around very much at all as the weather was too bad for much walking. The swelling started in his right ankle around that time and migrated up his leg.  He came in to see Dr. Nani Ravens when swelling and pain appeared in his right thigh His father did have a blood clot in his leg when Havish was a young kid.  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  He has not been getting outdoors as much since it has been so cold.  He does have a total gym at home that he uses some.   Wt Readings from Last 3 Encounters:  07/10/16 177 lb 6.4 oz (80.5 kg)  06/12/16 177 lb 9.6 oz (80.6 kg)  04/06/16 173 lb 9.6 oz (78.7 kg)   His weight is steady- he would like to lose a little bit if he can Urology has been following his PSA - it has trended down. They are seeing him again in 6 weeks.    Patient Active Problem List   Diagnosis Date Noted  . Elevated PSA 06/28/2016  . 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 Headache(784.0) 08/21/2013  . Lateral epicondylitis of right elbow 04/01/2013    Past Medical History:  Diagnosis Date  . Allergy   . Arthritis   . Asthma   . Cataract     Past Surgical History:  Procedure Laterality Date  . EYE SURGERY    . HERNIA REPAIR      Social History  Substance Use Topics  . Smoking status: Never Smoker  . Smokeless tobacco: Never  Used  . Alcohol use No    Family History  Problem Relation Age of Onset  . Lung cancer Mother   . Lung cancer Father   . Sudden death Neg Hx   . Hypertension Neg Hx   . Hyperlipidemia Neg Hx   . Heart attack Neg Hx   . Diabetes Neg Hx     No Known Allergies  Medication list has been reviewed and updated.  Current Outpatient Prescriptions on File Prior to Visit  Medication Sig Dispense Refill  . aspirin 81 MG tablet Take 81 mg by mouth daily.    . folic acid (FOLVITE) 1 MG tablet Take 1 mg by mouth daily.    . methotrexate 2.5 MG tablet Take by mouth once a week.    . naproxen (NAPROSYN) 500 MG tablet Take 500 mg by mouth 2 (two) times daily as needed.    . Rivaroxaban 15 & 20 MG TBPK Xarelto- 6 total weeks. 1st 3 weeks: Xarelto 15 mg, twice daily. After 3 weeks, take 20 mg daily 63 each 0   No current facility-administered medications on file prior to visit.     Review of Systems: No SOB, no CP, no hemoptysis  As  per HPI- otherwise negative.   Physical Examination: Vitals:   07/10/16 1007  BP: 102/72  Pulse: 76  Temp: 98 F (36.7 C)   Vitals:   07/10/16 1007  Weight: 177 lb 6.4 oz (80.5 kg)  Height: 5\' 8"  (1.727 m)   Body mass index is 26.97 kg/m. Ideal Body Weight: Weight in (lb) to have BMI = 25: 164.1  GEN: WDWN, NAD, Non-toxic, A & O x 3, minimal overweight, looks well HEENT: Atraumatic, Normocephalic. Neck supple. No masses, No LAD. Ears and Nose: No external deformity. CV: RRR, No M/G/R. No JVD. No thrill. No extra heart sounds. PULM: CTA B, no wheezes, crackles, rhonchi. No retractions. No resp. distress. No accessory muscle use. EXTR: No c/c/e NEURO Normal gait.  PSYCH: Normally interactive. Conversant. Not depressed or anxious appearing.  Calm demeanor.  Cord is still palpable in right calf but thigh tenderness and swelling is resolved. No redness or swelling of either leg   Assessment and Plan: Acute superficial venous thrombosis of lower extremity, right  Here today to follow-up >5 cm thrombus in right saphenous, presumed to be caused by recent bus trip.  He has about 2 weeks more of medication to complete 6 weeks of therapy.  Assuming he does not have another clot would not initiate long term anti-coagulation in this case.  Discussed doing a hypercoag panel in the future and he will think about this.  Advised him to take a full strength aspirin if on any long trips in the future, to move around frequently and to wear compression on his lower legs   Signed Lamar Blinks, MD

## 2016-08-03 DIAGNOSIS — Z6826 Body mass index (BMI) 26.0-26.9, adult: Secondary | ICD-10-CM | POA: Diagnosis not present

## 2016-08-03 DIAGNOSIS — M542 Cervicalgia: Secondary | ICD-10-CM | POA: Diagnosis not present

## 2016-08-03 DIAGNOSIS — M4690 Unspecified inflammatory spondylopathy, site unspecified: Secondary | ICD-10-CM | POA: Diagnosis not present

## 2016-08-03 DIAGNOSIS — M2550 Pain in unspecified joint: Secondary | ICD-10-CM | POA: Diagnosis not present

## 2016-08-03 DIAGNOSIS — M545 Low back pain: Secondary | ICD-10-CM | POA: Diagnosis not present

## 2016-08-03 DIAGNOSIS — M25471 Effusion, right ankle: Secondary | ICD-10-CM | POA: Diagnosis not present

## 2016-08-03 DIAGNOSIS — Z1589 Genetic susceptibility to other disease: Secondary | ICD-10-CM | POA: Diagnosis not present

## 2016-08-03 DIAGNOSIS — Z79899 Other long term (current) drug therapy: Secondary | ICD-10-CM | POA: Diagnosis not present

## 2016-08-03 DIAGNOSIS — E663 Overweight: Secondary | ICD-10-CM | POA: Diagnosis not present

## 2016-08-24 DIAGNOSIS — L570 Actinic keratosis: Secondary | ICD-10-CM | POA: Diagnosis not present

## 2016-08-24 DIAGNOSIS — L719 Rosacea, unspecified: Secondary | ICD-10-CM | POA: Diagnosis not present

## 2016-09-01 DIAGNOSIS — Z79899 Other long term (current) drug therapy: Secondary | ICD-10-CM | POA: Diagnosis not present

## 2016-10-31 DIAGNOSIS — M545 Low back pain: Secondary | ICD-10-CM | POA: Diagnosis not present

## 2016-10-31 DIAGNOSIS — Z6826 Body mass index (BMI) 26.0-26.9, adult: Secondary | ICD-10-CM | POA: Diagnosis not present

## 2016-10-31 DIAGNOSIS — L409 Psoriasis, unspecified: Secondary | ICD-10-CM | POA: Diagnosis not present

## 2016-10-31 DIAGNOSIS — Z1589 Genetic susceptibility to other disease: Secondary | ICD-10-CM | POA: Diagnosis not present

## 2016-10-31 DIAGNOSIS — M4690 Unspecified inflammatory spondylopathy, site unspecified: Secondary | ICD-10-CM | POA: Diagnosis not present

## 2016-10-31 DIAGNOSIS — Z79899 Other long term (current) drug therapy: Secondary | ICD-10-CM | POA: Diagnosis not present

## 2016-10-31 DIAGNOSIS — E663 Overweight: Secondary | ICD-10-CM | POA: Diagnosis not present

## 2016-10-31 DIAGNOSIS — M25471 Effusion, right ankle: Secondary | ICD-10-CM | POA: Diagnosis not present

## 2016-10-31 DIAGNOSIS — M2550 Pain in unspecified joint: Secondary | ICD-10-CM | POA: Diagnosis not present

## 2016-10-31 DIAGNOSIS — M542 Cervicalgia: Secondary | ICD-10-CM | POA: Diagnosis not present

## 2017-01-16 DIAGNOSIS — M2550 Pain in unspecified joint: Secondary | ICD-10-CM | POA: Diagnosis not present

## 2017-01-16 DIAGNOSIS — M25471 Effusion, right ankle: Secondary | ICD-10-CM | POA: Diagnosis not present

## 2017-01-16 DIAGNOSIS — Z6826 Body mass index (BMI) 26.0-26.9, adult: Secondary | ICD-10-CM | POA: Diagnosis not present

## 2017-01-16 DIAGNOSIS — E663 Overweight: Secondary | ICD-10-CM | POA: Diagnosis not present

## 2017-01-16 DIAGNOSIS — M542 Cervicalgia: Secondary | ICD-10-CM | POA: Diagnosis not present

## 2017-01-16 DIAGNOSIS — Z79899 Other long term (current) drug therapy: Secondary | ICD-10-CM | POA: Diagnosis not present

## 2017-01-16 DIAGNOSIS — M4690 Unspecified inflammatory spondylopathy, site unspecified: Secondary | ICD-10-CM | POA: Diagnosis not present

## 2017-01-16 DIAGNOSIS — M545 Low back pain: Secondary | ICD-10-CM | POA: Diagnosis not present

## 2017-01-16 DIAGNOSIS — L409 Psoriasis, unspecified: Secondary | ICD-10-CM | POA: Diagnosis not present

## 2017-01-16 DIAGNOSIS — Z1589 Genetic susceptibility to other disease: Secondary | ICD-10-CM | POA: Diagnosis not present

## 2017-02-11 NOTE — Progress Notes (Signed)
[place] at [facility] [address] [place], [place] [address] [phone]  Date:  02/12/2017   Name:  Randall Scott   DOB:  10-14-1950   MRN:  [id number]  PCP:  Randall Mclean, MD    Chief Complaint: Leg Swelling (Right)   History of Present Illness:  Randall Scott is a 66 y.o. very pleasant male patient who presents with the following:  Last seen by myself in January after he suffered a superficial right LE blood clot:  He was here on 12/11 with a superficial venous thrombosis of right leg He is using his xarelto - he is on the xarelto 20 daily.  He will use this for approx another 2 weeks and then be done He has not noted any easy bruising, nose bleeds, blood in stool or urine.   His leg pain is really resolved at this time, he feels like the treatment has worked  He has never had a clot in the past.  He did take a bus tour to [place] in October and was "off an on the bus for about a week,"  Often they did not move around very much at all as the weather was too bad for much walking. The swelling started in his right ankle around that time and migrated up his leg.  He came in to see Dr. Nani Scott when swelling and pain appeared in his right thigh His father did have a blood clot in his leg when Randall Scott was a young kid.  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  He has not been getting outdoors as much since it has been so cold.  He does have a total gym at home that he uses some.   Here today with concern of recurrent swelling and pain in his right leg- he is afraid that the clot could be back About 3 weeks ago he notes a tender area in his right inner thigh Does seem to be  getting better now compared to a week or so ago He has stopped his usual aspirin recently - he was on methotrexate and folate, and in this combination the aspirin sometimes will upset his stomach No recnet immobility - he does walk on a regular basis although not as much as he would like  [place] from last December:  IMPRESSION: Sonographic survey of the right lower extremity negative for DVT.  Superficial thrombophlebitis with thrombus of the right great saphenous vein in the distal thigh extending below knee.  We did have him use xarelto for 6 weeks after his last clot due to extent and location of clot His appetite is not as good as it was in the past, but he is holding his weight nevertheless Prior to his clot last year Randall Scott had no previous clots, although his father did have a clot at some point in his life He is not aware of any family or personal history of clotting disorder  No SOB, CP, or hemopysis No fever He is overall feeling well  Patient Active Problem List   Diagnosis Date Noted  . Elevated PSA 06/28/2016  . 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  Headache(784.0) 08/21/2013  . Lateral epicondylitis of right elbow 04/01/2013    Past Medical History:  Diagnosis Date  . Allergy   . Arthritis   . Asthma   . Cataract     Past Surgical History:  Procedure Laterality Date  . EYE SURGERY    . HERNIA REPAIR      Social History  Substance Use Topics  . Smoking status: Never Smoker  . Smokeless tobacco: Never Used  . Alcohol use No    Family History  Problem Relation Age of Onset  . Lung cancer Mother   . Lung cancer Father   . Sudden death Neg Hx   . Hypertension Neg Hx   . Hyperlipidemia Neg Hx   . Heart attack Neg Hx   . Diabetes Neg Hx     No Known Allergies  Medication list has been reviewed and updated.  Current Outpatient Prescriptions on File Prior to Visit  Medication Sig Dispense Refill  . aspirin 81 MG tablet Take 81 mg by mouth daily.     . folic acid (FOLVITE) 1 MG tablet Take 1 mg by mouth daily.    . methotrexate 2.5 MG tablet Take by mouth once a week.    . naproxen (NAPROSYN) 500 MG tablet Take 500 mg by mouth 2 (two) times daily as needed.     No current facility-administered medications on file prior to visit.     Review of Systems:  As per HPI- otherwise negative.   Physical Examination: Vitals:   02/12/17 1139  BP: 125/86  Pulse: 62  Temp: 98.2 F (36.8 C)  SpO2: 98%   Vitals:   02/12/17 1139  Weight: 175 lb 6.4 oz (79.6 kg)  Height: 5\' 8"  (1.727 m)   Body mass index is 26.67 kg/m. Ideal Body Weight: Weight in (lb) to have BMI = 25: 164.1  GEN: WDWN, NAD, Non-toxic, A & O x 3, looks well HEENT: Atraumatic, Normocephalic. Neck supple. No masses, No LAD. Ears and Nose: No external deformity. CV: RRR, No M/G/R. No JVD. No thrill. No extra heart sounds. PULM: CTA B, no wheezes, crackles, rhonchi. No retractions. No resp. distress. No accessory muscle use. EXTR: No c/c/e NEURO Normal gait.  PSYCH: Normally interactive. Conversant. Not depressed or anxious appearing.  Calm demeanor.  He has tenderness and notes an area of concern on the right inner thigh There is no obvious swelling, no heat or redness. The knee and calf are normal   Assessment and Plan: Venous thromboembolism - Plan: rivaroxaban (XARELTO) 20 MG TABS tablet, Rivaroxaban (XARELTO) 15 MG TABS tablet  Right leg pain - Plan: US Venous Img Lower Unilateral Right  Here today with concern about recurrent right leg thrombus.  Will obtain an [place] for him today  Signed Randall Blinks, MD  Received his [place] and gave him a call- unfortunately he has a clot again.  It is not technically a DVT but is at high risk of extending into the deep venous system: extensive superficial venous thrombosis in the greater and lesser saphenous veins on the right with acute appearing deep venous thrombosis at the right saphenous femoral junction. Acute  appearing thrombus of this nature is at risk for propagating into the common femoral vein which would result in deep venous thrombosis at this level.  Will start anticoagulation with xarelto now and plan to repeat US in one week.  15 BID for 3 weeks, then 20 mg a day.  Plan to treat for 3-6 months  this time He is taking aspirin for non- specific prevention- will hold this and NSAIDs while on xarelto He will seek care right away for any worsening or change in his symptoms   Meds ordered this encounter  Medications  . rivaroxaban (XARELTO) 20 MG TABS tablet    Sig: Take 1 tablet (20 mg total) by mouth daily with supper.    Dispense:  30 tablet    Refill:  5  . Rivaroxaban (XARELTO) 15 MG TABS tablet    Sig: Take 1 tablet (15 mg total) by mouth 2 (two) times daily with a meal. Complete 3 weeks of 15 mg twice a day and then change to 20 mg once a day    Dispense:  22 tablet    Refill:  0     US Venous Img Lower Unilateral Right  Result Date: 02/12/2017 CLINICAL DATA:  Right lower extremity pain EXAM: RIGHT LOWER EXTREMITY VENOUS DUPLEX ULTRASOUND TECHNIQUE: Gray-scale sonography with graded compression, as well as color Doppler and duplex ultrasound were performed to evaluate the right lower extremity deep venous system from the level of the common femoral vein and including the common femoral, femoral, profunda femoral, popliteal and calf veins including the posterior tibial, peroneal and gastrocnemius veins when visible. The superficial great saphenous vein was also interrogated. Spectral Doppler was utilized to evaluate flow at rest and with distal augmentation maneuvers in the common femoral, femoral and popliteal veins. COMPARISON:  None. FINDINGS: Contralateral Common Femoral Vein: Respiratory phasicity is normal and symmetric with the symptomatic side. No evidence of thrombus. Normal compressibility. Common Femoral Vein: No evidence of thrombus. Normal compressibility, respiratory phasicity  and response to augmentation. Saphenofemoral Junction: There is localized deep venous thrombosis at the saphenous femoral junction, currently not extending into the right common femoral vein. Profunda Femoral Vein: No evidence of thrombus. Normal compressibility and flow on color Doppler imaging. Femoral Vein: No evidence of thrombus. Normal compressibility, respiratory phasicity and response to augmentation. Popliteal Vein: No evidence of thrombus. Normal compressibility, respiratory phasicity and response to augmentation. Calf Veins: No evidence of thrombus. Normal compressibility and flow on color Doppler imaging. Superficial Great Saphenous Vein: No evidence of thrombus. Normal compressibility and flow on color Doppler imaging. Venous Reflux:  None. Other Findings: There is acute appearing thrombus throughout the right greater saphenous vein with apparent thrombus in portions of the right lesser saphenous vein as well. IMPRESSION: There is no deep venous thrombosis in the right lower extremity. Note, however, that there is extensive superficial venous thrombosis in the greater and lesser saphenous veins on the right with acute appearing deep venous thrombosis at the right saphenous femoral junction. Acute appearing thrombus of this nature is at risk for propagating into the common femoral vein which would result in deep venous thrombosis at this level. Given this circumstance, it may be prudent to consider repeat imaging in 2-3 days to assess for this possibility. This finding definitely places this patient at risk for development of deep venous thrombosis on the right. Left common femoral vein also patent. These results will be called to the ordering clinician or representative by the Radiologist Assistant, and communication documented in the PACS or zVision Dashboard. Electronically Signed   By: Lowella Grip III M.D.   On: 02/12/2017 16:04   Pt given samples of 15 mg xarelto, 3 bottles of 7 pills  each [phone]

## 2017-02-12 ENCOUNTER — Ambulatory Visit (INDEPENDENT_AMBULATORY_CARE_PROVIDER_SITE_OTHER): Payer: PPO | Admitting: Family Medicine

## 2017-02-12 ENCOUNTER — Encounter: Payer: Self-pay | Admitting: Family Medicine

## 2017-02-12 ENCOUNTER — Ambulatory Visit (HOSPITAL_BASED_OUTPATIENT_CLINIC_OR_DEPARTMENT_OTHER)
Admission: RE | Admit: 2017-02-12 | Discharge: 2017-02-12 | Disposition: A | Discharge status: Home / Self Care | Payer: PPO | Source: Ambulatory Visit | Attending: Family Medicine | Admitting: Family Medicine

## 2017-02-12 VITALS — BP 125/86 | HR 62 | Temp 98.2°F | Ht 68.0 in | Wt 175.4 lb

## 2017-02-12 DIAGNOSIS — M79661 Pain in right lower leg: Secondary | ICD-10-CM | POA: Diagnosis not present

## 2017-02-12 DIAGNOSIS — M79604 Pain in right leg: Secondary | ICD-10-CM | POA: Insufficient documentation

## 2017-02-12 DIAGNOSIS — I82491 Acute embolism and thrombosis of other specified deep vein of right lower extremity: Secondary | ICD-10-CM | POA: Insufficient documentation

## 2017-02-12 DIAGNOSIS — I8290 Acute embolism and thrombosis of unspecified vein: Secondary | ICD-10-CM | POA: Diagnosis not present

## 2017-02-12 DIAGNOSIS — I82811 Embolism and thrombosis of superficial veins of right lower extremities: Secondary | ICD-10-CM | POA: Diagnosis not present

## 2017-02-12 MED ORDER — RIVAROXABAN 15 MG PO TABS: 15.0000 mg | ORAL_TABLET | Freq: Two times a day (BID) | ORAL | 0 refills | Status: DC

## 2017-02-12 MED ORDER — RIVAROXABAN 20 MG PO TABS: 20.0000 mg | ORAL_TABLET | Freq: Every day | ORAL | 5 refills | Status: DC

## 2017-02-12 NOTE — Patient Instructions (Signed)
Ultrasound on the ground floor at 3pm!  I will be in touch with your results asap

## 2017-02-19 ENCOUNTER — Ambulatory Visit (HOSPITAL_BASED_OUTPATIENT_CLINIC_OR_DEPARTMENT_OTHER)
Admission: RE | Admit: 2017-02-19 | Discharge: 2017-02-19 | Disposition: A | Discharge status: Home / Self Care | Payer: PPO | Source: Ambulatory Visit | Attending: Family Medicine | Admitting: Family Medicine

## 2017-02-19 DIAGNOSIS — I82811 Embolism and thrombosis of superficial veins of right lower extremities: Secondary | ICD-10-CM | POA: Diagnosis not present

## 2017-02-19 DIAGNOSIS — I8290 Acute embolism and thrombosis of unspecified vein: Secondary | ICD-10-CM

## 2017-02-20 ENCOUNTER — Telehealth: Payer: Self-pay | Admitting: Family Medicine

## 2017-02-20 DIAGNOSIS — I8290 Acute embolism and thrombosis of unspecified vein: Secondary | ICD-10-CM

## 2017-02-20 MED ORDER — RIVAROXABAN 20 MG PO TABS: 20.0000 mg | ORAL_TABLET | Freq: Every day | ORAL | 1 refills | Status: DC

## 2017-02-20 NOTE — Telephone Encounter (Signed)
Called pt to discuss Sent in 90 day rx for xarelto 20 for him My question is about duration of anticoagulation.  We need to do 3-6 months for recurrent clot.  However, as these clots were not necessarily true DVT, I am uncertain if long term (lifetime) anticoagulation is necessary.  Will refer to Dr. [place] Olp to discuss further His leg is feeling better He will let me know if any other concerns

## 2017-02-20 NOTE — Telephone Encounter (Signed)
Patient called called this am for results of [place]. Advised patient results had not been reviewed by provider yet. As I was talking to patient phone somehow dissconnected. Advised front office staff in case patient returned call.

## 2017-02-20 NOTE — Telephone Encounter (Signed)
Returning call regarding [place] report, has a question regarding xarelton, woulld like a 90 day supply sent in, [place]. Call back # [phone]

## 2017-02-20 NOTE — Telephone Encounter (Signed)
Error

## 2017-02-22 NOTE — Telephone Encounter (Signed)
See additional phone note from 02/20/17 by PCP.

## 2017-03-01 ENCOUNTER — Ambulatory Visit (HOSPITAL_BASED_OUTPATIENT_CLINIC_OR_DEPARTMENT_OTHER): Payer: PPO | Admitting: Hematology & Oncology

## 2017-03-01 ENCOUNTER — Other Ambulatory Visit (HOSPITAL_BASED_OUTPATIENT_CLINIC_OR_DEPARTMENT_OTHER): Payer: PPO

## 2017-03-01 VITALS — BP 106/67 | HR 65 | Temp 97.8°F | Resp 18 | Wt 173.0 lb

## 2017-03-01 DIAGNOSIS — Z7901 Long term (current) use of anticoagulants: Secondary | ICD-10-CM

## 2017-03-01 DIAGNOSIS — I82811 Embolism and thrombosis of superficial veins of right lower extremities: Secondary | ICD-10-CM | POA: Diagnosis not present

## 2017-03-01 DIAGNOSIS — R1033 Periumbilical pain: Secondary | ICD-10-CM

## 2017-03-01 DIAGNOSIS — R634 Abnormal weight loss: Secondary | ICD-10-CM

## 2017-03-01 DIAGNOSIS — I824Y1 Acute embolism and thrombosis of unspecified deep veins of right proximal lower extremity: Secondary | ICD-10-CM | POA: Insufficient documentation

## 2017-03-01 LAB — CMP (CANCER CENTER ONLY)
ALBUMIN: 3.7 g/dL (ref 3.3–5.5)
ALT(SGPT): 24 U/L (ref 10–47)
AST: 28 U/L (ref 11–38)
Alkaline Phosphatase: 55 U/L (ref 26–84)
BILIRUBIN TOTAL: 0.9 mg/dL (ref 0.20–1.60)
BUN, Bld: 12 mg/dL (ref 7–22)
CALCIUM: 9 mg/dL (ref 8.0–10.3)
CHLORIDE: 106 meq/L (ref 98–108)
CO2: 28 mEq/L (ref 18–33)
CREATININE: 1.5 mg/dL — AB (ref 0.6–1.2)
Glucose, Bld: 88 mg/dL (ref 73–118)
Potassium: 3.6 mEq/L (ref 3.3–4.7)
SODIUM: 141 meq/L (ref 128–145)
TOTAL PROTEIN: 6.4 g/dL (ref 6.4–8.1)

## 2017-03-01 LAB — CBC WITH DIFFERENTIAL (CANCER CENTER ONLY)
BASO#: 0.1 10*3/uL (ref 0.0–0.2)
BASO%: 0.9 % (ref 0.0–2.0)
EOS ABS: 0.2 10*3/uL (ref 0.0–0.5)
EOS%: 3.3 % (ref 0.0–7.0)
HEMATOCRIT: 38.5 % — AB (ref 38.7–49.9)
HEMOGLOBIN: 13.4 g/dL (ref 13.0–17.1)
LYMPH#: 1.6 10*3/uL (ref 0.9–3.3)
LYMPH%: 28.9 % (ref 14.0–48.0)
MCH: 35.6 pg — AB (ref 28.0–33.4)
MCHC: 34.8 g/dL (ref 32.0–35.9)
MCV: 102 fL — ABNORMAL HIGH (ref 82–98)
MONO#: 0.3 10*3/uL (ref 0.1–0.9)
MONO%: 4.9 % (ref 0.0–13.0)
NEUT%: 62 % (ref 40.0–80.0)
NEUTROS ABS: 3.4 10*3/uL (ref 1.5–6.5)
Platelets: 204 10*3/uL (ref 145–400)
RBC: 3.76 10*6/uL — AB (ref 4.20–5.70)
RDW: 13 % (ref 11.1–15.7)
WBC: 5.5 10*3/uL (ref 4.0–10.0)

## 2017-03-01 NOTE — Progress Notes (Signed)
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 New Patient (Initial Visit)  : I don't know why I have had blood clots.  HPI: Randall Scott is a very nice 66 year old white male. He is rose from [place]. He was a Furniture conservator/restorer. Is incredibly fascinating what he did for a living. He is now retired.  Last October he and his wife went up to [place] on a bus trip. The weather was not that good. As such, he really did not get off the bus all that much. When he came back, he began to have some pain in his right lower leg. He saw his family doctor. He did have some slight swelling. He had no rash. He had no change in bowel or bladder habits. There is no abdominal pain.  He underwent a Doppler of his right leg. This was done in December. The Doppler showed a superficial thrombus in the saphenous vein.  He was placed on Xarelto. He was on Xarelto for 6 weeks. The pain improved. The blood clot seemed to resolve.  A couple weeks ago, he began to have pain in his right thigh. He began to notice some phlebitis. He saw Randall Scott. As always, she was very astute and did another Doppler. This showed extensive superficial thrombus in the greater and lesser saphenous veins with a thrombus at the right saphenous femoral junction.  He was placed on Xarelto again. He currently is on 15 mg twice a day and will transition over to the 20 mg dose.  When this happened, she kindly referred him to the [facility] for an evaluation.  Of note, he says that his father had blood clots. He said that his father was in the hospital for his blood clots. He said that a paternal uncle had "phlebitis".  He does not smoke. He has no diabetes. He has been quite active. He does not exercise formally but does get out and do a lot of things outside.  He's never had surgery outside of a double hernia about 20  years ago.  There is no abdominal pain. There is no weight loss.  His last colonoscopy was a couple years ago. I think he had diverticulosis.  He says that his PSA has been mildly elevated. He has seen a urologist who does not feel that this warrants any prostate intervention.  He's had no chest wall pain. There is no cough. There's no shortness of breath.  Overall, his performance status is ECOG 0.    Past Medical History:  Diagnosis Date  . Allergy   . Arthritis   . Asthma   . Cataract   :  Past Surgical History:  Procedure Laterality Date  . EYE SURGERY    . HERNIA REPAIR    :   Current Outpatient Prescriptions:  .  aspirin 81 MG tablet, Take 81 mg by mouth daily., Disp: , Rfl:  .  folic acid (FOLVITE) 1 MG tablet, Take 1 mg by mouth daily., Disp: , Rfl:  .  methotrexate 2.5 MG tablet, Take by mouth once a week., Disp: , Rfl:  .  naproxen (NAPROSYN) 500 MG tablet, Take 500 mg by mouth 2 (two) times daily as needed., Disp: , Rfl:  .  Rivaroxaban (XARELTO) 15 MG TABS tablet, Take 1 tablet (15 mg total) by mouth 2 (two) times daily with a meal. Complete 3 weeks of  15 mg twice a day and then change to 20 mg once a day, Disp: 22 tablet, Rfl: 0 .  rivaroxaban (XARELTO) 20 MG TABS tablet, Take 1 tablet (20 mg total) by mouth daily with supper., Disp: 90 tablet, Rfl: 1:  :  No Known Allergies:  Family History  Problem Relation Age of Onset  . Lung cancer Mother   . Lung cancer Father   . Sudden death Neg Hx   . Hypertension Neg Hx   . Hyperlipidemia Neg Hx   . Heart attack Neg Hx   . Diabetes Neg Hx   :  Social History   Social History  . Marital status: Married    Spouse name: Randall Scott  . Number of children: 2  . Years of education: 12   Occupational History  . Therapist, nutritional   Social History Main Topics  . Smoking status: Never Smoker  . Smokeless tobacco: Never Used  . Alcohol use No  . Drug use: No  . Sexual activity: Yes    Birth  control/ protection: None     Comment: married   Other Topics Concern  . Not on file   Social History Narrative   Lives with his wife.  Daughters are grown and live nearby.   Patient is right handed   Education level is 12   Caffeine consumption is 2-3 cups daily  :  Pertinent items are noted in HPI.  Exam:Well-developed and well-nourished white male in no obvious distress. Vital signs show temperature of 97.8. Pulse 65. Blood pressure 106/67. Weight is 173 pounds. Head and neck exam shows no ocular or oral lesions. There are no palpable cervical or supraclavicular lymph nodes. Lungs are clear bilaterally. Cardiac exam regular rate and rhythm with no murmurs, rubs or bruits. Abdomen is soft. He has good bowel sounds. There is no fluid wave. There is no palpable liver or spleen tip. Back exam shows no tenderness over the spine, ribs or hips. Extremities shows no clubbing, cyanosis or edema in his legs. There may be some slight tenderness to palpation on the inner right thigh. No venous cord is noted. There is no Homans sign noted on the right side. Has good pulses in his distal extremities. Skin exam shows no rashes, ecchymosis or petechia. Neurological exam shows no focal neurological deficits.    Recent Labs  03/01/17 1533  WBC 5.5  HGB 13.4  HCT 38.5*  PLT 204    Recent Labs  03/01/17 1533  NA 141  K 3.6  CL 106  CO2 28  GLUCOSE 88  BUN 12  CREATININE 1.5*  CALCIUM 9.0    Blood smear review:  None  Pathology: None     Assessment and Plan:  Randall Scott is a very nice 66 year old white male. He has a recurrent thrombus in the right saphenous vein. This has not yet formally propagated to a deep venous thrombosis. However, it certainly was headed that what direction.  It is interesting as to why he now has this. We did send off a hypercoagulable panel given that his father had blood clots.  I'm also worried about the possibility of an underlying malignancy. Pancreatic  cancer and GI cancer in general, are associated with superficial thromboembolic disease. As such, I would like to get a CT of his abdomen and pelvis.  He clearly will need 6 months of full dose anticoagulation. After 6 months of full anticoagulation, I would put him on low-dose maintenance anticoagulation for 1  year.  I spent about 45 minutes with him. I answered all of his questions. I explained to him my recommendations. He agrees with the plan for 6 months of full dose Xarelto then half dose Xarelto for a year.  I would repeat his Doppler in about 3 months. I think this would be reasonable.  He understands my plan. Again I answered all of his questions. He was very astute and very eloquent.  I would like to see him back in 6 weeks. We will see how things are going.  We will get his CT scan next week or so.

## 2017-03-02 LAB — PROTEIN S, TOTAL: Protein S, Total: 58 % — ABNORMAL LOW (ref 60–150)

## 2017-03-02 LAB — D-DIMER, QUANTITATIVE: D-DIMER: <0.2 mg/L FEU (ref 0.00–0.49)

## 2017-03-02 LAB — PROTEIN C ACTIVITY: PROTEIN C ACTIVITY: 88 % (ref 73–180)

## 2017-03-02 LAB — PROTEIN S ACTIVITY: Protein S-Functional: 76 % (ref 63–140)

## 2017-03-02 LAB — ANTITHROMBIN III: Antithrombin Activity: 98 % (ref 75–135)

## 2017-03-03 LAB — BETA-2-GLYCOPROTEIN I ABS, IGG/M/A: Beta-2 Glyco 1 IgM: <9 GPI IgM units (ref 0–32)

## 2017-03-03 LAB — LUPUS ANTICOAGULANT PANEL
PTT-LA: 36.5 s (ref 0.0–51.9)
dRVVT Confirm: 1.2 ratio (ref 0.8–1.2)
dRVVT Mix: 55.5 s — ABNORMAL HIGH (ref 0.0–47.0)
dRVVT: 68.6 s — ABNORMAL HIGH (ref 0.0–47.0)

## 2017-03-04 LAB — CARDIOLIPIN ANTIBODIES, IGG, IGM, IGA
Anticardiolipin Ab,IgA,Qn: <9 APL U/mL (ref 0–11)
Anticardiolipin Ab,IgM,Qn: <9 MPL U/mL (ref 0–12)

## 2017-03-05 LAB — PROTEIN C, TOTAL: PROTEIN C ANTIGEN: 69 % (ref 60–150)

## 2017-03-06 LAB — PROTHROMBIN GENE MUTATION

## 2017-03-08 LAB — FACTOR 5 LEIDEN

## 2017-03-14 ENCOUNTER — Other Ambulatory Visit: Payer: Self-pay | Admitting: Family

## 2017-03-14 ENCOUNTER — Encounter (HOSPITAL_BASED_OUTPATIENT_CLINIC_OR_DEPARTMENT_OTHER): Payer: Self-pay

## 2017-03-14 ENCOUNTER — Telehealth: Payer: Self-pay | Admitting: *Deleted

## 2017-03-14 ENCOUNTER — Telehealth: Payer: Self-pay | Admitting: Hematology & Oncology

## 2017-03-14 ENCOUNTER — Ambulatory Visit (HOSPITAL_BASED_OUTPATIENT_CLINIC_OR_DEPARTMENT_OTHER)
Admission: RE | Admit: 2017-03-14 | Discharge: 2017-03-14 | Disposition: A | Discharge status: Home / Self Care | Payer: PPO | Source: Ambulatory Visit | Attending: Hematology & Oncology | Admitting: Hematology & Oncology

## 2017-03-14 DIAGNOSIS — K5730 Diverticulosis of large intestine without perforation or abscess without bleeding: Secondary | ICD-10-CM | POA: Insufficient documentation

## 2017-03-14 DIAGNOSIS — I824Y1 Acute embolism and thrombosis of unspecified deep veins of right proximal lower extremity: Secondary | ICD-10-CM

## 2017-03-14 DIAGNOSIS — R918 Other nonspecific abnormal finding of lung field: Secondary | ICD-10-CM | POA: Diagnosis not present

## 2017-03-14 DIAGNOSIS — R109 Unspecified abdominal pain: Secondary | ICD-10-CM | POA: Diagnosis not present

## 2017-03-14 DIAGNOSIS — R1033 Periumbilical pain: Secondary | ICD-10-CM | POA: Diagnosis present

## 2017-03-14 DIAGNOSIS — R634 Abnormal weight loss: Secondary | ICD-10-CM

## 2017-03-14 MED ORDER — IOPAMIDOL (ISOVUE-300) INJECTION 61%
100.0000 mL | Freq: Once | INTRAVENOUS | Status: AC | PRN
  Administered 2017-03-14: 100 mL via INTRAVENOUS

## 2017-03-14 NOTE — Telephone Encounter (Addendum)
Patient aware of results.   ----- Message from Volanda Napoleon, MD sent at 03/14/2017  1:31 PM EDT ----- Call - CT scan looks ok!!  Nothing to suggest cancer!!  Laurey Arrow

## 2017-03-14 NOTE — Telephone Encounter (Signed)
I left a message on Randall Scott cell phone. I told him that he did have a hereditary factor that cause his blood clots.  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 I spoke to him through this message about the factor V Leiden mutation.  He does not need to change his anticoagulation. He might need to be on long-term anticoagulation.  I said that this is some that he inherited. I believe there is a family history of blood clots.  If he has any questions he can certainly call us.Lattie Haw, MD

## 2017-04-07 DIAGNOSIS — D6851 Activated protein C resistance: Secondary | ICD-10-CM | POA: Insufficient documentation

## 2017-04-07 NOTE — Progress Notes (Signed)
[facility] [address] [place], [place] [address] [phone]  Date:  04/09/2017   Name:  Randall Scott   DOB:  [birth year]   MRN:  [id number]  PCP:  Darreld Mclean, MD    Chief Complaint: Annual Exam   History of Present Illness:  Randall Scott is a 66 y.o. very pleasant male patient who presents with the following:  Here today for a CPE Last visit with me in August when he was found to have a recurrent superficial venous clot- he saw Dr. Marin Olp an her turns out to have factor V Leiden  Assessment and Plan:  Randall Scott is a very nice 66 year old white male. He has a recurrent thrombus in the right saphenous vein. This has not yet formally propagated to a deep venous thrombosis. However, it certainly was headed that what direction. It is interesting as to why he now has this. We did send off a hypercoagulable panel given that his father had blood clots. I'm also worried about the possibility of an underlying malignancy. Pancreatic cancer and GI cancer in general, are associated with superficial thromboembolic disease. As such, I would like to get a CT of his abdomen and pelvis. He clearly will need 6 months of full dose anticoagulation. After 6 months of full anticoagulation, I would put him on low-dose maintenance anticoagulation for 1 year.  He is due for his flu shot and pneumovax 23 today Colonoscopy is UTD Labs: he has had labs recently, just not a cholestenol or PSA His PSA was a bit high last year and he has seen urology- I do not see a note from them however. Pt reports that his PSA went back down, and we will just repeat his PSA today.  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    He is fasting today We will do his pneumovax today Patient Active Problem List   Diagnosis Date Noted  . Factor V Leiden mutation ([place]) 04/07/2017  . Deep vein thrombosis (DVT) of proximal vein of right lower extremity ([place]) 03/01/2017  . Elevated PSA 06/28/2016  . Spondyloarthropathy ([place]) 03/25/2015  . Headache(784.0) 08/21/2013  . Lateral epicondylitis of right elbow 04/01/2013    Past Medical History:  Diagnosis Date  . Allergy   . Arthritis   . Asthma   . Cataract     Past Surgical History:  Procedure Laterality Date  . EYE SURGERY    . HERNIA REPAIR      Social History  Substance Use Topics  . Smoking status: Never Smoker  . Smokeless tobacco: Never Used  . Alcohol use No    Family History  Problem Relation Age of Onset  . Lung cancer Mother   . Lung cancer Father   . Sudden death Neg Hx   . Hypertension Neg Hx   . Hyperlipidemia Neg Hx   . Heart attack Neg Hx   .  Diabetes Neg Hx     No Known Allergies  Medication list has been reviewed and updated.  Current Outpatient Prescriptions on File Prior to Visit  Medication Sig Dispense Refill  . aspirin 81 MG tablet Take 81 mg by mouth daily.    . folic acid (FOLVITE) 1 MG tablet Take 1 mg by mouth daily.    . methotrexate 2.5 MG tablet Take by mouth once a week.    . naproxen (NAPROSYN) 500 MG tablet Take 500 mg by mouth 2 (two) times daily as needed.    . Rivaroxaban (XARELTO) 15 MG TABS tablet Take 1 tablet (15 mg total) by mouth 2 (two) times daily with a meal. Complete 3 weeks of 15 mg twice a day and then change to 20 mg once a day 22 tablet 0  . rivaroxaban (XARELTO) 20 MG TABS tablet Take 1 tablet (20 mg total) by mouth daily with supper. 90 tablet 1   No current facility-administered medications on file  prior to visit.     Review of Systems:  As per HPI- otherwise negative. He exercises by walking most days- no issues with SOB or CP with exercise No urinary or GI symptoms  Physical Examination: Vitals:   04/09/17 0832  BP: 134/82  Pulse: 68  Temp: 98.1 F (36.7 C)  SpO2: 99%   Vitals:   04/09/17 0832  Weight: 173 lb 9.6 oz (78.7 kg)  Height: 5\' 8"  (1.727 m)   Body mass index is 26.4 kg/m. Ideal Body Weight: Weight in (lb) to have BMI = 25: 164.1  GEN: WDWN, NAD, Non-toxic, A & O x 3, normal weight, looks well HEENT: Atraumatic, Normocephalic. Neck supple. No masses, No LAD.  Bilateral TM wnl, oropharynx normal.  PEERL,EOMI.   Ears and Nose: No external deformity. CV: RRR, No M/G/R. No JVD. No thrill. No extra heart sounds. PULM: CTA B, no wheezes, crackles, rhonchi. No retractions. No resp. distress. No accessory muscle use. ABD: S, NT, ND. No rebound. No HSM. EXTR: No c/c/e NEURO Normal gait.  PSYCH: Normally interactive. Conversant. Not depressed or anxious appearing.  Calm demeanor.    Assessment and Plan: Physical exam  Factor V Leiden mutation ([place])  Venous thromboembolism  Screening for hyperlipidemia - Plan: Lipid panel  Elevated PSA - Plan: PSA  Medication monitoring encounter - Plan: Basic metabolic panel  Immunization due - Plan: Pneumococcal polysaccharide vaccine 23-valent greater than or equal to 2yo subcutaneous/IM, Flu Vaccine QUAD 36+ mos IM  Here today for a CPE He does have factor V lieden mutation- we presume this is why he had his blood clot.  He will continue taking xarelto for the long term He does have an oncology appt this Friday for follow-up Advised him that his children may wish to be tested as well, although they have not had any evidence of a clotting disorder Check PSA today Update immunizations His joints are bothering him more- he will see rheumatology next week Discussed shingrix- he did have zostavax a few years  ago  Signed Lamar Blinks, MD

## 2017-04-09 ENCOUNTER — Encounter: Payer: Self-pay | Admitting: Family Medicine

## 2017-04-09 ENCOUNTER — Ambulatory Visit (INDEPENDENT_AMBULATORY_CARE_PROVIDER_SITE_OTHER): Payer: PPO | Admitting: Family Medicine

## 2017-04-09 VITALS — BP 134/82 | HR 68 | Temp 98.1°F | Ht 68.0 in | Wt 173.6 lb

## 2017-04-09 DIAGNOSIS — R972 Elevated prostate specific antigen [PSA]: Secondary | ICD-10-CM | POA: Diagnosis not present

## 2017-04-09 DIAGNOSIS — Z23 Encounter for immunization: Secondary | ICD-10-CM

## 2017-04-09 DIAGNOSIS — Z0000 Encounter for general adult medical examination without abnormal findings: Secondary | ICD-10-CM

## 2017-04-09 DIAGNOSIS — Z5181 Encounter for therapeutic drug level monitoring: Secondary | ICD-10-CM | POA: Diagnosis not present

## 2017-04-09 DIAGNOSIS — D6851 Activated protein C resistance: Secondary | ICD-10-CM | POA: Diagnosis not present

## 2017-04-09 DIAGNOSIS — I8290 Acute embolism and thrombosis of unspecified vein: Secondary | ICD-10-CM

## 2017-04-09 DIAGNOSIS — Z13220 Encounter for screening for lipoid disorders: Secondary | ICD-10-CM | POA: Diagnosis not present

## 2017-04-09 LAB — BASIC METABOLIC PANEL
BUN: 12 mg/dL (ref 6–23)
CHLORIDE: 107 meq/L (ref 96–112)
CO2: 27 meq/L (ref 19–32)
Calcium: 9.3 mg/dL (ref 8.4–10.5)
Creatinine, Ser: 1.18 mg/dL (ref 0.40–1.50)
GFR: 65.52 mL/min (ref >60.00)
Glucose, Bld: 91 mg/dL (ref 70–99)
POTASSIUM: 4.4 meq/L (ref 3.5–5.1)
SODIUM: 142 meq/L (ref 135–145)

## 2017-04-09 LAB — LIPID PANEL
Cholesterol: 130 mg/dL (ref 0–200)
HDL: 49.7 mg/dL
LDL Cholesterol: 67 mg/dL (ref 0–99)
NonHDL: 80.52
Total CHOL/HDL Ratio: 3
Triglycerides: 66 mg/dL (ref 0.0–149.0)
VLDL: 13.2 mg/dL (ref 0.0–40.0)

## 2017-04-09 LAB — PSA: PSA: 2.15 ng/mL (ref 0.10–4.00)

## 2017-04-09 NOTE — Patient Instructions (Addendum)
It was great to see you again today!  Take care and I will be in touch with your labs asap You got your flu shot and 2nd pneumonia shot today You might consider getting the shingrix vaccine series at your convenience at the drug store    Health Maintenance, Male A healthy lifestyle and preventive care is important for your health and wellness. Ask your health care provider about what schedule of regular examinations is right for you. What should I know about weight and diet? Eat a Healthy Diet  Eat plenty of vegetables, fruits, whole grains, low-fat dairy products, and lean protein.  Do not eat a lot of foods high in solid fats, added sugars, or salt.  Maintain a Healthy Weight Regular exercise can help you achieve or maintain a healthy weight. You should:  Do at least 150 minutes of exercise each week. The exercise should increase your heart rate and make you sweat (moderate-intensity exercise).  Do strength-training exercises at least twice a week.  Watch Your Levels of Cholesterol and Blood Lipids  Have your blood tested for lipids and cholesterol every 5 years starting at 66 years of age. If you are at high risk for heart disease, you should start having your blood tested when you are 66 years old. You may need to have your cholesterol levels checked more often if: ? Your lipid or cholesterol levels are high. ? You are older than 66 years of age. ? You are at high risk for heart disease.  What should I know about cancer screening? Many types of cancers can be detected early and may often be prevented. Lung Cancer  You should be screened every year for lung cancer if: ? You are a current smoker who has smoked for at least 30 years. ? You are a former smoker who has quit within the past 15 years.  Talk to your health care provider about your screening options, when you should start screening, and how often you should be screened.  Colorectal Cancer  Routine colorectal cancer  screening usually begins at 66 years of age and should be repeated every 5-10 years until you are 66 years old. You may need to be screened more often if early forms of precancerous polyps or small growths are found. Your health care provider may recommend screening at an earlier age if you have risk factors for colon cancer.  Your health care provider may recommend using home test kits to check for hidden blood in the stool.  A small camera at the end of a tube can be used to examine your colon (sigmoidoscopy or colonoscopy). This checks for the earliest forms of colorectal cancer.  Prostate and Testicular Cancer  Depending on your age and overall health, your health care provider may do certain tests to screen for prostate and testicular cancer.  Talk to your health care provider about any symptoms or concerns you have about testicular or prostate cancer.  Skin Cancer  Check your skin from head to toe regularly.  Tell your health care provider about any new moles or changes in moles, especially if: ? There is a change in a mole's size, shape, or color. ? You have a mole that is larger than a pencil eraser.  Always use sunscreen. Apply sunscreen liberally and repeat throughout the day.  Protect yourself by wearing long sleeves, pants, a wide-brimmed hat, and sunglasses when outside.  What should I know about heart disease, diabetes, and high blood pressure?  If  you are 35-57 years of age, have your blood pressure checked every 3-5 years. If you are 44 years of age or older, have your blood pressure checked every year. You should have your blood pressure measured twice-once when you are at a hospital or clinic, and once when you are not at a hospital or clinic. Record the average of the two measurements. To check your blood pressure when you are not at a hospital or clinic, you can use: ? An automated blood pressure machine at a pharmacy. ? A home blood pressure monitor.  Talk to your  health care provider about your target blood pressure.  If you are between 55-34 years old, ask your health care provider if you should take aspirin to prevent heart disease.  Have regular diabetes screenings by checking your fasting blood sugar level. ? If you are at a normal weight and have a low risk for diabetes, have this test once every three years after the age of 3. ? If you are overweight and have a high risk for diabetes, consider being tested at a younger age or more often.  A one-time screening for abdominal aortic aneurysm (AAA) by ultrasound is recommended for men aged 75-75 years who are current or former smokers. What should I know about preventing infection? Hepatitis B If you have a higher risk for hepatitis B, you should be screened for this virus. Talk with your health care provider to find out if you are at risk for hepatitis B infection. Hepatitis C Blood testing is recommended for:  Everyone born from [birth year] through 1965.  Anyone with known risk factors for hepatitis C.  Sexually Transmitted Diseases (STDs)  You should be screened each year for STDs including gonorrhea and chlamydia if: ? You are sexually active and are younger than 66 years of age. ? You are older than 66 years of age and your health care provider tells you that you are at risk for this type of infection. ? Your sexual activity has changed since you were last screened and you are at an increased risk for chlamydia or gonorrhea. Ask your health care provider if you are at risk.  Talk with your health care provider about whether you are at high risk of being infected with HIV. Your health care provider may recommend a prescription medicine to help prevent HIV infection.  What else can I do?  Schedule regular health, dental, and eye exams.  Stay current with your vaccines (immunizations).  Do not use any tobacco products, such as cigarettes, chewing tobacco, and e-cigarettes. If you need help  quitting, ask your health care provider.  Limit alcohol intake to no more than 2 drinks per day. One drink equals 12 ounces of beer, 5 ounces of wine, or 1 ounces of hard liquor.  Do not use street drugs.  Do not share needles.  Ask your health care provider for help if you need support or information about quitting drugs.  Tell your health care provider if you often feel depressed.  Tell your health care provider if you have ever been abused or do not feel safe at home. This information is not intended to replace advice given to you by your health care provider. Make sure you discuss any questions you have with your health care provider. Document Released: 12/16/2007 Document Revised: 02/16/2016 Document Reviewed: 03/23/2015 Elsevier Interactive Patient Education  [employer].

## 2017-04-09 NOTE — Progress Notes (Addendum)
Subjective:   Randall Scott is a 66 y.o. male who presents for an Initial Medicare Annual Wellness Visit.  Review of Systems  No ROS.  Medicare Wellness Visit. Additional risk factors are reflected in the social history.  Cardiac Risk Factors include: advanced age (>38men, >37 women);male gender Sleep patterns: Restless sleep for 6-7 hrs.  Home Safety/Smoke Alarms: Feels safe in home. Smoke alarms in place.  Living environment; residence and Firearm Safety: Lives with wife. Seat Belt Safety/Bike Helmet: Wears seat belt.  Male:   CCS- last 03/05/13 PSA-  Lab Results  Component Value Date   PSA 4.44 (H) 04/06/2016   PSA 1.40 05/10/2015   PSA 1.78 04/29/2014      Objective:    Today's Vitals   04/09/17 0832  BP: 134/82  Pulse: 68  Temp: 98.1 F (36.7 C)  TempSrc: Oral  SpO2: 99%  Weight: 173 lb 9.6 oz (78.7 kg)  Height: 5\' 8"  (1.727 m)   Body mass index is 26.4 kg/m.  Current Medications (verified) Outpatient Encounter Prescriptions as of 04/09/2017  Medication Sig  . folic acid (FOLVITE) 1 MG tablet Take 1 mg by mouth daily.  . methotrexate 2.5 MG tablet Take by mouth once a week.  . naproxen (NAPROSYN) 500 MG tablet Take 500 mg by mouth 2 (two) times daily as needed.  . rivaroxaban (XARELTO) 20 MG TABS tablet Take 1 tablet (20 mg total) by mouth daily with supper.  . [DISCONTINUED] aspirin 81 MG tablet Take 81 mg by mouth daily.  . [DISCONTINUED] Rivaroxaban (XARELTO) 15 MG TABS tablet Take 1 tablet (15 mg total) by mouth 2 (two) times daily with a meal. Complete 3 weeks of 15 mg twice a day and then change to 20 mg once a day   No facility-administered encounter medications on file as of 04/09/2017.     Allergies (verified) Patient has no known allergies.   History: Past Medical History:  Diagnosis Date  . Allergy   . Arthritis   . Asthma   . Cataract    Past Surgical History:  Procedure Laterality Date  . EYE SURGERY    . HERNIA REPAIR     Family  History  Problem Relation Age of Onset  . Lung cancer Mother   . Lung cancer Father   . Sudden death Neg Hx   . Hypertension Neg Hx   . Hyperlipidemia Neg Hx   . Heart attack Neg Hx   . Diabetes Neg Hx    Social History   Occupational History  . Therapist, nutritional   Social History Main Topics  . Smoking status: Never Smoker  . Smokeless tobacco: Never Used  . Alcohol use No  . Drug use: No  . Sexual activity: Yes    Birth control/ protection: None     Comment: married   Tobacco Counseling Counseling given: No   Activities of Daily Living In your present state of health, do you have any difficulty performing the following activities: 04/09/2017  Hearing? N  Vision? N  Comment reading glasses.  Difficulty concentrating or making decisions? N  Walking or climbing stairs? N  Dressing or bathing? N  Doing errands, shopping? N  Preparing Food and eating ? N  Using the Toilet? N  In the past six months, have you accidently leaked urine? N  Do you have problems with loss of bowel control? N  Managing your Medications? N  Managing your Finances? N  Housekeeping or managing your  Housekeeping? N  Some recent data might be hidden    Immunizations and Health Maintenance Immunization History  Administered Date(s) Administered  . Influenza Split 07/04/2006  . Influenza, High Dose Seasonal PF 04/06/2016, 04/09/2017  . Influenza,inj,Quad PF,6+ Mos 03/25/2013, 04/21/2014, 05/10/2015  . Pneumococcal Conjugate-13 04/06/2016  . Pneumococcal Polysaccharide-23 04/09/2017  . Tdap 12/26/2012   There are no preventive care reminders to display for this patient.  Patient Care Team: Copland, Gay Filler, MD as PCP - General (Family Medicine) Maylon Peppers, MD (Family Medicine) Marin Olp Rudell Cobb, MD as Consulting Physician (Oncology)  Indicate any recent Medical Services you may have received from other than [facility] providers in the past year (date may be approximate).     Assessment:   This is a routine wellness examination for Randall Scott. Physical assessment deferred to PCP.   Hearing/Vision screen Hearing Screening Comments: Able to hear conversational tones w/o difficulty. No issues reported.   Vision Screening Comments: Pt states he has yearly eye exams.  Dietary issues and exercise activities discussed: Current Exercise Habits: Home exercise routine, Type of exercise: walking, Time (Minutes): 30, Frequency (Times/Week): 4, Weekly Exercise (Minutes/Week): 120, Intensity: Mild Diet (meal preparation, eat out, water intake, caffeinated beverages, dairy products, fruits and vegetables): well balanced  Goals      Patient Stated   . maintain current healthy lifestyle (pt-stated)      Depression Screen PHQ 2/9 Scores 04/09/2017 04/06/2016 04/06/2016 05/10/2015  PHQ - 2 Score 0 0 0 0  Exception Documentation Patient refusal - - -    Fall Risk Fall Risk  04/09/2017 04/09/2017 04/06/2016  Falls in the past year? No No No    Cognitive Function: MMSE - Mini Mental State Exam 04/09/2017  Orientation to time 5  Orientation to Place 5  Registration 3  Attention/ Calculation 5  Recall 3  Language- name 2 objects 2  Language- repeat 1  Language- follow 3 step command 3  Language- read & follow direction 1  Write a sentence 1  Copy design 1  Total score 30        Screening Tests Health Maintenance  Topic Date Due  . TETANUS/TDAP  12/27/2022  . COLONOSCOPY  03/06/2023  . INFLUENZA VACCINE  Completed  . Hepatitis C Screening  Completed  . PNA vac Low Risk Adult  Completed        Plan:   Follow up with PCP as directed  Continue to eat heart healthy diet (full of fruits, vegetables, whole grains, lean protein, water--limit salt, fat, and sugar intake) and increase physical activity as tolerated.  Continue doing brain stimulating activities (puzzles, reading, adult coloring books, staying active) to keep memory sharp.   Bring a copy of your living  will and/or healthcare power of attorney to your next office visit.  I have personally reviewed and noted the following in the patient's chart:   . Medical and social history . Use of alcohol, tobacco or illicit drugs  . Current medications and supplements . Functional ability and status . Nutritional status . Physical activity . Advanced directives . List of other physicians . Hospitalizations, surgeries, and ER visits in previous 12 months . Vitals . Screenings to include cognitive, depression, and falls . Referrals and appointments  In addition, I have reviewed and discussed with patient certain preventive protocols, quality metrics, and best practice recommendations. A written personalized care plan for preventive services as well as general preventive health recommendations were provided to patient.     Vevelyn Royals,  Laurette Schimke, RN   04/09/2017    I have reviewed the above MWE note by Ms. Vevelyn Royals and agree with her documentation Denny Peon MD

## 2017-04-11 ENCOUNTER — Encounter: Payer: Self-pay | Admitting: Family Medicine

## 2017-04-13 ENCOUNTER — Other Ambulatory Visit (HOSPITAL_BASED_OUTPATIENT_CLINIC_OR_DEPARTMENT_OTHER): Payer: PPO

## 2017-04-13 ENCOUNTER — Ambulatory Visit (HOSPITAL_BASED_OUTPATIENT_CLINIC_OR_DEPARTMENT_OTHER): Payer: PPO | Admitting: Hematology & Oncology

## 2017-04-13 VITALS — BP 115/75 | HR 69 | Temp 97.7°F | Resp 20 | Wt 175.8 lb

## 2017-04-13 DIAGNOSIS — R1033 Periumbilical pain: Secondary | ICD-10-CM

## 2017-04-13 DIAGNOSIS — I824Y1 Acute embolism and thrombosis of unspecified deep veins of right proximal lower extremity: Secondary | ICD-10-CM | POA: Diagnosis not present

## 2017-04-13 DIAGNOSIS — Z7901 Long term (current) use of anticoagulants: Secondary | ICD-10-CM

## 2017-04-13 DIAGNOSIS — R634 Abnormal weight loss: Secondary | ICD-10-CM

## 2017-04-13 DIAGNOSIS — D6851 Activated protein C resistance: Secondary | ICD-10-CM

## 2017-04-13 LAB — CMP (CANCER CENTER ONLY)
ALT(SGPT): 34 U/L (ref 10–47)
AST: 30 U/L (ref 11–38)
Albumin: 3.8 g/dL (ref 3.3–5.5)
Alkaline Phosphatase: 51 U/L (ref 26–84)
BILIRUBIN TOTAL: 0.9 mg/dL (ref 0.20–1.60)
BUN: 13 mg/dL (ref 7–22)
CALCIUM: 10 mg/dL (ref 8.0–10.3)
CO2: 33 meq/L (ref 18–33)
CREATININE: 1.5 mg/dL — AB (ref 0.6–1.2)
Chloride: 108 mEq/L (ref 98–108)
GLUCOSE: 66 mg/dL — AB (ref 73–118)
Potassium: 4.4 mEq/L (ref 3.3–4.7)
SODIUM: 147 meq/L — AB (ref 128–145)
Total Protein: 6.6 g/dL (ref 6.4–8.1)

## 2017-04-13 LAB — CBC WITH DIFFERENTIAL (CANCER CENTER ONLY)
BASO#: 0.1 10*3/uL (ref 0.0–0.2)
BASO%: 0.8 % (ref 0.0–2.0)
EOS%: 2.8 % (ref 0.0–7.0)
Eosinophils Absolute: 0.2 10*3/uL (ref 0.0–0.5)
HCT: 43.6 % (ref 38.7–49.9)
HGB: 15 g/dL (ref 13.0–17.1)
LYMPH#: 1.2 10*3/uL (ref 0.9–3.3)
LYMPH%: 19.3 % (ref 14.0–48.0)
MCH: 35.8 pg — ABNORMAL HIGH (ref 28.0–33.4)
MCHC: 34.4 g/dL (ref 32.0–35.9)
MCV: 104 fL — ABNORMAL HIGH (ref 82–98)
MONO#: 0.5 10*3/uL (ref 0.1–0.9)
MONO%: 8.2 % (ref 0.0–13.0)
NEUT#: 4.4 10*3/uL (ref 1.5–6.5)
NEUT%: 68.9 % (ref 40.0–80.0)
PLATELETS: 195 10*3/uL (ref 145–400)
RBC: 4.19 10*6/uL — ABNORMAL LOW (ref 4.20–5.70)
RDW: 13.2 % (ref 11.1–15.7)
WBC: 6.3 10*3/uL (ref 4.0–10.0)

## 2017-04-13 NOTE — Progress Notes (Signed)
Hematology and Oncology Follow Up Visit  TYLAN KINN [id number] 10-31-1950 66 y.o. 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 Wyndham is back for follow-up. We did find that he has the factor V Leiden mutation. He is heterozygous for this.  He has 2 daughters. I told them that they need to be checked. They have had children. They have not had miscarriages. He does not know if they are on oral contraceptives.  He has had no problems with his legs. He's had no pain in his right leg. His been no swelling in his right leg.  We did go ahead and get a CT scan of his abdomen. This was negative for any malignancy.  He's had no bleeding while on the Xarelto. He's had no change in bowel or bladder habits. He's had no cough. His been no chest wall pain. He's had no headache. His been no visual changes.  Overall, his performance status is ECOG 1.  Medications:  Current Outpatient Prescriptions:  .  folic acid (FOLVITE) 1 MG tablet, Take 1 mg by mouth daily., Disp: , Rfl:  .  methotrexate 2.5 MG tablet, Take by mouth once a week., Disp: , Rfl:  .  naproxen (NAPROSYN) 500 MG tablet, Take 500 mg by mouth 2 (two) times daily as needed., Disp: , Rfl:  .  rivaroxaban (XARELTO) 20 MG TABS tablet, Take 1 tablet (20 mg total) by mouth daily with supper., Disp: 90 tablet, Rfl: 1  Allergies: No Known Allergies  Past Medical History, Surgical history, Social history, and Family History were reviewed and updated.  Review of Systems: Review of Systems  Constitutional: Negative for appetite change, fatigue, fever and unexpected weight change.  HENT:   Negative for lump/mass, mouth sores, sore throat and trouble swallowing.   Respiratory: Negative for cough, hemoptysis and shortness of breath.     Cardiovascular: Negative for leg swelling and palpitations.  Gastrointestinal: Negative for abdominal distention, abdominal pain, blood in stool, constipation, diarrhea, nausea and vomiting.  Genitourinary: Negative for bladder incontinence, dysuria, frequency and hematuria.   Musculoskeletal: Negative for arthralgias, back pain, gait problem and myalgias.  Skin: Negative for itching and rash.  Neurological: Negative for dizziness, extremity weakness, gait problem, headaches, numbness, seizures and speech difficulty.  Hematological: Does not bruise/bleed easily.  Psychiatric/Behavioral: Negative for depression and sleep disturbance. The patient is not nervous/anxious.     Physical Exam:  weight is 175 lb 12.8 oz (79.7 kg). His oral temperature is 97.7 F (36.5 C). His blood pressure is 115/75 and his pulse is 69. His respiration is 20 and oxygen saturation is 99%.   Wt Readings from Last 3 Encounters:  04/13/17 175 lb 12.8 oz (79.7 kg)  04/09/17 173 lb 9.6 oz (78.7 kg)  03/01/17 173 lb (78.5 kg)    Physical Exam  Constitutional: He is oriented to person, place, and time.  HENT:  Head: Normocephalic and atraumatic.  Mouth/Throat: Oropharynx is clear and moist.  Eyes: Pupils are equal, round, and reactive to light. EOM are normal.  Neck: Normal range of motion.  Cardiovascular: Normal rate, regular rhythm and normal heart sounds.   Pulmonary/Chest: Effort normal and breath sounds normal.  Abdominal: Soft. Bowel sounds are normal.  Musculoskeletal: Normal range of motion. He exhibits no edema,  tenderness or deformity.  Lymphadenopathy:    He has no cervical adenopathy.  Neurological: He is alert and oriented to person, place, and time.  Skin: Skin is warm and dry. No rash noted. No erythema.  Psychiatric: He has a normal mood and affect. His behavior is normal. Judgment and thought content normal.  Vitals reviewed.    Lab Results  Component Value Date   WBC 6.3 04/13/2017    HGB 15.0 04/13/2017   HCT 43.6 04/13/2017   MCV 104 (H) 04/13/2017   PLT 195 04/13/2017     Chemistry      Component Value Date/Time   NA 142 04/09/2017 0857   NA 141 03/01/2017 1533   K 4.4 04/09/2017 0857   K 3.6 03/01/2017 1533   CL 107 04/09/2017 0857   CL 106 03/01/2017 1533   CO2 27 04/09/2017 0857   CO2 28 03/01/2017 1533   BUN 12 04/09/2017 0857   BUN 12 03/01/2017 1533   CREATININE 1.18 04/09/2017 0857   CREATININE 1.5 (H) 03/01/2017 1533   GLU 88 02/01/2016      Component Value Date/Time   CALCIUM 9.3 04/09/2017 0857   CALCIUM 9.0 03/01/2017 1533   ALKPHOS 55 03/01/2017 1533   AST 28 03/01/2017 1533   ALT 24 03/01/2017 1533   BILITOT 0.90 03/01/2017 1533         Impression and Plan: Mr. Hubble is A 66 year old male. He has a fat 5 Leiden mutation. He is heterozygous for this.  We will continue him on the Xarelto.  I would like to see him back in about 4 months or so. We'll see him back, we will get a Doppler of his right leg to see how everything looks. I will like to think that this blood clot will have resolved.  We will see him back in 4 months.   Volanda Napoleon, MD 10/12/201810:05 AM

## 2017-04-18 DIAGNOSIS — Z6826 Body mass index (BMI) 26.0-26.9, adult: Secondary | ICD-10-CM | POA: Diagnosis not present

## 2017-04-18 DIAGNOSIS — M4690 Unspecified inflammatory spondylopathy, site unspecified: Secondary | ICD-10-CM | POA: Diagnosis not present

## 2017-04-18 DIAGNOSIS — Z1589 Genetic susceptibility to other disease: Secondary | ICD-10-CM | POA: Diagnosis not present

## 2017-04-18 DIAGNOSIS — L409 Psoriasis, unspecified: Secondary | ICD-10-CM | POA: Diagnosis not present

## 2017-04-18 DIAGNOSIS — E663 Overweight: Secondary | ICD-10-CM | POA: Diagnosis not present

## 2017-04-18 DIAGNOSIS — Z79899 Other long term (current) drug therapy: Secondary | ICD-10-CM | POA: Diagnosis not present

## 2017-04-18 DIAGNOSIS — M2550 Pain in unspecified joint: Secondary | ICD-10-CM | POA: Diagnosis not present

## 2017-06-07 ENCOUNTER — Telehealth: Payer: Self-pay | Admitting: Emergency Medicine

## 2017-06-07 NOTE — Telephone Encounter (Signed)
Faxed results from Oct to [facility] as request on 06/06/2017.    Copied from [place] [phone]. Topic: General - Other >> Jun 05, 2017  8:48 AM Carolyn Stare wrote:  Jenny Reichmann with [facility] Rheumatologist call to say they had ask for lad results pt had back in Oct to be sent over to them. She said they can not refill his methotrexate with recent labs   Fax number [phone]

## 2017-07-13 DIAGNOSIS — H5203 Hypermetropia, bilateral: Secondary | ICD-10-CM | POA: Diagnosis not present

## 2017-07-19 DIAGNOSIS — E663 Overweight: Secondary | ICD-10-CM | POA: Diagnosis not present

## 2017-07-19 DIAGNOSIS — M4690 Unspecified inflammatory spondylopathy, site unspecified: Secondary | ICD-10-CM | POA: Diagnosis not present

## 2017-07-19 DIAGNOSIS — Z79899 Other long term (current) drug therapy: Secondary | ICD-10-CM | POA: Diagnosis not present

## 2017-07-19 DIAGNOSIS — L409 Psoriasis, unspecified: Secondary | ICD-10-CM | POA: Diagnosis not present

## 2017-07-19 DIAGNOSIS — L4050 Arthropathic psoriasis, unspecified: Secondary | ICD-10-CM | POA: Diagnosis not present

## 2017-07-19 DIAGNOSIS — M2550 Pain in unspecified joint: Secondary | ICD-10-CM | POA: Diagnosis not present

## 2017-07-19 DIAGNOSIS — Z6826 Body mass index (BMI) 26.0-26.9, adult: Secondary | ICD-10-CM | POA: Diagnosis not present

## 2017-07-19 DIAGNOSIS — Z1589 Genetic susceptibility to other disease: Secondary | ICD-10-CM | POA: Diagnosis not present

## 2017-07-19 DIAGNOSIS — M545 Low back pain: Secondary | ICD-10-CM | POA: Diagnosis not present

## 2017-07-20 DIAGNOSIS — H61009 Unspecified perichondritis of external ear, unspecified ear: Secondary | ICD-10-CM | POA: Diagnosis not present

## 2017-07-20 DIAGNOSIS — L570 Actinic keratosis: Secondary | ICD-10-CM | POA: Diagnosis not present

## 2017-07-20 DIAGNOSIS — L820 Inflamed seborrheic keratosis: Secondary | ICD-10-CM | POA: Diagnosis not present

## 2017-07-20 DIAGNOSIS — D485 Neoplasm of uncertain behavior of skin: Secondary | ICD-10-CM | POA: Diagnosis not present

## 2017-08-06 ENCOUNTER — Encounter: Payer: Self-pay | Admitting: Medical

## 2017-08-06 ENCOUNTER — Other Ambulatory Visit: Payer: Self-pay | Admitting: Medical

## 2017-08-06 ENCOUNTER — Ambulatory Visit (INDEPENDENT_AMBULATORY_CARE_PROVIDER_SITE_OTHER): Payer: PPO | Admitting: Medical

## 2017-08-06 VITALS — BP 110/70 | HR 87 | Temp 98.4°F | Resp 16 | Ht 68.0 in | Wt 174.4 lb

## 2017-08-06 DIAGNOSIS — J0100 Acute maxillary sinusitis, unspecified: Secondary | ICD-10-CM

## 2017-08-06 DIAGNOSIS — J029 Acute pharyngitis, unspecified: Secondary | ICD-10-CM | POA: Diagnosis not present

## 2017-08-06 LAB — POCT RAPID STREP A (OFFICE): Rapid Strep A Screen: POSITIVE — AB

## 2017-08-06 MED ORDER — AMOXICILLIN-POT CLAVULANATE 875-125 MG PO TABS: 1.0000 | ORAL_TABLET | Freq: Two times a day (BID) | ORAL | 0 refills | Status: DC

## 2017-08-06 MED ORDER — BENZONATATE 100 MG PO CAPS: 100.0000 mg | ORAL_CAPSULE | Freq: Three times a day (TID) | ORAL | 0 refills | Status: DC | PRN

## 2017-08-06 MED ORDER — FLUTICASONE PROPIONATE 50 MCG/ACT NA SUSP: 2.0000 | Freq: Every day | NASAL | 1 refills | Status: DC

## 2017-08-06 NOTE — Progress Notes (Signed)
Subjective:    Patient ID: Randall Scott, male    DOB: 03/15/1951, 67 y.o.   MRN: [id number]  HPI  Pt in with some st since Saturday and some sinus pressure as well. Pt report some faint achiness suprasternal notch area and into his throat but clarifies not in his chest.   Some mucus when he blows his nose.(some nasal congestion and sinus pressure for about 2 weeks)  Rare occasional cough. On review no shortness of breath or wheezing.  Pt has 4 grandchildren but no known contact with strep.    Review of Systems  Constitutional: Negative for chills, fatigue and fever.  HENT: Positive for congestion, sinus pressure, sinus pain and sore throat. Negative for sneezing and trouble swallowing.   Respiratory: Positive for cough. Negative for chest tightness, shortness of breath and wheezing.   Cardiovascular: Negative for chest pain and palpitations.  Gastrointestinal: Negative for abdominal pain and anal bleeding.  Genitourinary: Negative for dysuria, flank pain, hematuria, scrotal swelling and urgency.  Musculoskeletal: Negative for back pain and myalgias.  Skin: Negative for pallor and rash.  Neurological: Negative for dizziness, speech difficulty, weakness, numbness and headaches.  Hematological: Negative for adenopathy. Does not bruise/bleed easily.  Psychiatric/Behavioral: Negative for behavioral problems and confusion.     Past Medical History:  Diagnosis Date  . Allergy   . Arthritis   . Asthma   . Cataract      Social History   Socioeconomic History  . Marital status: Married    Spouse name: Caren Griffins  . Number of children: 2  . Years of education: 77  . Highest education level: Not on file  Social Needs  . Financial resource strain: Not on file  . Food insecurity - worry: Not on file  . Food insecurity - inability: Not on file  . Transportation needs - medical: Not on file  . Transportation needs - non-medical: Not on file  Occupational History  . Occupation:  Information systems manager: [employer]  Tobacco Use  . Smoking status: Never Smoker  . Smokeless tobacco: Never Used  Substance and Sexual Activity  . Alcohol use: No  . Drug use: No  . Sexual activity: Yes    Birth control/protection: None    Comment: married  Other Topics Concern  . Not on file  Social History Narrative   Lives with his wife.  Daughters are grown and live nearby.   Patient is right handed   Education level is 12   Caffeine consumption is 2-3 cups daily    Past Surgical History:  Procedure Laterality Date  . EYE SURGERY    . HERNIA REPAIR      Family History  Problem Relation Age of Onset  . Lung cancer Mother   . Lung cancer Father   . Sudden death Neg Hx   . Hypertension Neg Hx   . Hyperlipidemia Neg Hx   . Heart attack Neg Hx   . Diabetes Neg Hx     No Known Allergies  Current Outpatient Medications on File Prior to Visit  Medication Sig Dispense Refill  . folic acid (FOLVITE) 1 MG tablet Take 1 mg by mouth daily.    . methotrexate 2.5 MG tablet Take by mouth once a week.    . naproxen (NAPROSYN) 500 MG tablet Take 500 mg by mouth 2 (two) times daily as needed.    . rivaroxaban (XARELTO) 20 MG TABS tablet Take 1 tablet (20 mg total) by  mouth daily with supper. 90 tablet 1   No current facility-administered medications on file prior to visit.     BP (!) 125/100   Pulse 87   Temp 98.4 F (36.9 C) (Oral)   Resp 16   Ht 5\' 8"  (1.727 m)   Wt 174 lb 6.4 oz (79.1 kg)   SpO2 99%   BMI 26.52 kg/m       Objective:   Physical Exam  General  Mental Status - Alert. General Appearance - Well groomed. Not in acute distress.  Skin Rashes- No Rashes.  HEENT Head- Normal. Ear Auditory Canal - Left- Normal. Right - Normal.Tympanic Membrane- Left- Normal. Right- Normal. Eye Sclera/Conjunctiva- Left- Normal. Right- Normal. Nose & Sinuses Nasal Mucosa- Left-  Boggy and Congested. Right-  Boggy and  Congested.Bilateral maxillary and  frontal sinus pressure. Mouth & Throat Lips: Upper Lip- Normal: no dryness, cracking, pallor, cyanosis, or vesicular eruption. Lower Lip-Normal: no dryness, cracking, pallor, cyanosis or vesicular eruption. Buccal Mucosa- Bilateral- No Aphthous ulcers. Oropharynx- No Discharge or Erythema. Tonsils: Characteristics- Bilateral- bright  Erythema uvula mild enflammed . Size/Enlargement- Bilateral- No enlargement. Discharge- bilateral-None.  Neck Neck- Supple. No Masses.   Chest and Lung Exam Auscultation: Breath Sounds:-Clear even and unlabored.  Cardiovascular Auscultation:Rythm- Regular, rate and rhythm. Murmurs & Other Heart Sounds:Ausculatation of the heart reveal- No Murmurs.  Lymphatic Head & Neck General Head & Neck Lymphatics: Bilateral: Description- No Localized lymphadenopathy.       Assessment & Plan:  Your strep test was positive. I am prescribing Augmentin antibiotic. Rest hydrate, tylenol for fever and warm salt water gargles.  Recent on and off nasal congestion and some sinus symptoms 4 weeks as well.  Possible sinus infection as well and Augmentin has good coverage of the sinuses.  For nasal congestion and history of allergies, prescribing Flonase.  For mild cough making benzonatate available.  Lungs are clear today with no wheezing.  But with some history of asthma.  If you start to wheeze let me know and would send in albuterol inhaler.  Some faint achiness suprasternal notch region with sore throat/strep.  On clarification/discussion no chest pain.  However you do have chest pain would need to be rechecked and get EKG.  If after hours then recommend ED evaluation.  Follow-up in 7-10 days or as needed.  Mackie Pai, PA-C

## 2017-08-06 NOTE — Patient Instructions (Addendum)
Your strep test was positive. I am prescribing Augmentin antibiotic. Rest hydrate, tylenol for fever and warm salt water gargles.  Recent on and off nasal congestion and some sinus symptoms 4 weeks as well.  Possible sinus infection as well and Augmentin has good coverage of the sinuses.  For nasal congestion and history of allergies, prescribing Flonase.  For mild cough making benzonatate available.  Lungs are clear today with no wheezing.  But with some history of asthma.  If you start to wheeze let me know and would send in albuterol inhaler.  Some faint achiness suprasternal notch region with sore throat/strep.  On clarification/discussion no chest pain.  However you do have chest pain would need to be rechecked and get EKG.  If after hours then recommend ED evaluation.  Follow-up in 7-10 days or as needed.

## 2017-08-07 DIAGNOSIS — Z79899 Other long term (current) drug therapy: Secondary | ICD-10-CM | POA: Diagnosis not present

## 2017-08-07 DIAGNOSIS — M2550 Pain in unspecified joint: Secondary | ICD-10-CM | POA: Diagnosis not present

## 2017-08-17 ENCOUNTER — Inpatient Hospital Stay: Payer: PPO | Attending: Hematology & Oncology | Admitting: Hematology & Oncology

## 2017-08-17 ENCOUNTER — Ambulatory Visit (HOSPITAL_BASED_OUTPATIENT_CLINIC_OR_DEPARTMENT_OTHER)
Admission: RE | Admit: 2017-08-17 | Discharge: 2017-08-17 | Disposition: A | Discharge status: Home / Self Care | Payer: PPO | Source: Ambulatory Visit | Attending: Hematology & Oncology | Admitting: Hematology & Oncology

## 2017-08-17 ENCOUNTER — Inpatient Hospital Stay: Payer: PPO

## 2017-08-17 ENCOUNTER — Other Ambulatory Visit: Payer: Self-pay

## 2017-08-17 VITALS — BP 108/78 | HR 69 | Temp 98.1°F | Resp 17 | Wt 173.0 lb

## 2017-08-17 DIAGNOSIS — I824Y1 Acute embolism and thrombosis of unspecified deep veins of right proximal lower extremity: Secondary | ICD-10-CM

## 2017-08-17 DIAGNOSIS — I82819 Embolism and thrombosis of superficial veins of unspecified lower extremities: Secondary | ICD-10-CM | POA: Diagnosis not present

## 2017-08-17 DIAGNOSIS — Z7901 Long term (current) use of anticoagulants: Secondary | ICD-10-CM

## 2017-08-17 DIAGNOSIS — I82409 Acute embolism and thrombosis of unspecified deep veins of unspecified lower extremity: Secondary | ICD-10-CM | POA: Diagnosis not present

## 2017-08-17 DIAGNOSIS — D6851 Activated protein C resistance: Secondary | ICD-10-CM | POA: Insufficient documentation

## 2017-08-17 LAB — CBC WITH DIFFERENTIAL (CANCER CENTER ONLY)
BASOS ABS: 0 10*3/uL (ref 0.0–0.1)
Basophils Relative: 1 %
EOS PCT: 3 %
Eosinophils Absolute: 0.2 10*3/uL (ref 0.0–0.5)
HEMATOCRIT: 45.4 % (ref 38.7–49.9)
Hemoglobin: 15.6 g/dL (ref 13.0–17.1)
LYMPHS ABS: 1.8 10*3/uL (ref 0.9–3.3)
LYMPHS PCT: 25 %
MCH: 35 pg — AB (ref 28.0–33.4)
MCHC: 34.4 g/dL (ref 32.0–35.9)
MCV: 101.8 fL — AB (ref 82.0–98.0)
MONO ABS: 0.6 10*3/uL (ref 0.1–0.9)
Monocytes Relative: 9 %
NEUTROS ABS: 4.5 10*3/uL (ref 1.5–6.5)
Neutrophils Relative %: 62 %
PLATELETS: 194 10*3/uL (ref 145–400)
RBC: 4.46 MIL/uL (ref 4.20–5.70)
RDW: 13.2 % (ref 11.1–15.7)
WBC: 7.1 10*3/uL (ref 4.0–10.0)

## 2017-08-17 LAB — CMP (CANCER CENTER ONLY)
ALBUMIN: 3.9 g/dL (ref 3.5–5.0)
ALT: 31 U/L (ref 0–55)
ANION GAP: 8 (ref 3–11)
AST: 24 U/L (ref 5–34)
Alkaline Phosphatase: 61 U/L (ref 40–150)
BUN: 14 mg/dL (ref 7–26)
CHLORIDE: 107 mmol/L (ref 98–109)
CO2: 26 mmol/L (ref 22–29)
Calcium: 9.4 mg/dL (ref 8.4–10.4)
Creatinine: 1.17 mg/dL (ref 0.70–1.30)
GFR, Est AFR Am: >60 mL/min (ref >60)
GFR, Estimated: >60 mL/min (ref >60)
Glucose, Bld: 83 mg/dL (ref 70–140)
POTASSIUM: 4.5 mmol/L (ref 3.5–5.1)
Sodium: 141 mmol/L (ref 136–145)
TOTAL PROTEIN: 6.5 g/dL (ref 6.4–8.3)
Total Bilirubin: 0.6 mg/dL (ref 0.2–1.2)

## 2017-08-17 NOTE — Progress Notes (Signed)
Hematology and Oncology Follow Up Visit  Randall Scott [id number] [birth year] 67 y.o. 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 We did find that he has the factor V Leiden mutation. He is heterozygous for this.  We did go ahead and repeat his Doppler of the right leg.  This showed resolution of the thrombus in the femoral vein.  There is been resolution of the saphenofemoral thrombus.  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  He has had no issues with bleeding.  He was wondering why we never check his blood to make sure that his blood is not "thin".  I told him that this is not done with the new oral anticoagulants.  He has had no pain.  He is had no cough or shortness of breath.  He has had no chest wall pain.  He had no cough.  Is had no mouth sores.  Is had no headache.  He is still working.  Overall, I said his performance status is ECOG 1.   Medications:  Current Outpatient Medications:  .  vitamin A 10000 UNIT capsule, Take 10,000 Units by mouth daily., Disp: , Rfl:  .  fluticasone (FLONASE) 50 MCG/ACT nasal spray, SHAKE LIQUID AND USE 2 SPRAYS IN EACH NOSTRIL DAILY, Disp: 48 g, Rfl: 1 .  folic acid (FOLVITE) 1 MG tablet, Take 1 mg by mouth daily., Disp: , Rfl:  .  methotrexate 2.5 MG tablet, Take by mouth once a week., Disp: , Rfl:  .  naproxen (NAPROSYN) 500 MG tablet, Take 500 mg by mouth 2 (two) times daily as needed., Disp: , Rfl:  .  rivaroxaban (XARELTO) 20 MG TABS tablet, Take 1 tablet (20 mg total) by mouth daily with supper., Disp: 90 tablet, Rfl: 1  Allergies: No Known Allergies  Past Medical History, Surgical history, Social history, and Family History were reviewed and updated.  Review of  Systems: Review of Systems  Constitutional: Negative for appetite change, fatigue, fever and unexpected weight change.  HENT:   Negative for lump/mass, mouth sores, sore throat and trouble swallowing.   Respiratory: Negative for cough, hemoptysis and shortness of breath.   Cardiovascular: Negative for leg swelling and palpitations.  Gastrointestinal: Negative for abdominal distention, abdominal pain, blood in stool, constipation, diarrhea, nausea and vomiting.  Genitourinary: Negative for bladder incontinence, dysuria, frequency and hematuria.   Musculoskeletal: Negative for arthralgias, back pain, gait problem and myalgias.  Skin: Negative for itching and rash.  Neurological: Negative for dizziness, extremity weakness, gait problem, headaches, numbness, seizures and speech difficulty.  Hematological: Does not bruise/bleed easily.  Psychiatric/Behavioral: Negative for depression and sleep disturbance. The patient is not nervous/anxious.     Physical Exam:  weight is 173 lb (78.5 kg). His oral temperature is 98.1 F (36.7 C). His blood pressure is 108/78 and his pulse is 69. His respiration is 17 and oxygen saturation is 98%.   Wt Readings from Last 3 Encounters:  08/17/17 173 lb (78.5 kg)  08/06/17 174 lb 6.4 oz (79.1 kg)  04/13/17 175 lb 12.8 oz (79.7 kg)    Physical Exam  Constitutional: He is oriented to person, place, and time.  HENT:  Head: Normocephalic and atraumatic.  Mouth/Throat: Oropharynx  is clear and moist.  Eyes: EOM are normal. Pupils are equal, round, and reactive to light.  Neck: Normal range of motion.  Cardiovascular: Normal rate, regular rhythm and normal heart sounds.  Pulmonary/Chest: Effort normal and breath sounds normal.  Abdominal: Soft. Bowel sounds are normal.  Musculoskeletal: Normal range of motion. He exhibits no edema, tenderness or deformity.  Lymphadenopathy:    He has no cervical adenopathy.  Neurological: He is alert and oriented to person,  place, and time.  Skin: Skin is warm and dry. No rash noted. No erythema.  Psychiatric: He has a normal mood and affect. His behavior is normal. Judgment and thought content normal.  Vitals reviewed.    Lab Results  Component Value Date   WBC 7.1 08/17/2017   HGB 15.0 04/13/2017   HCT 45.4 08/17/2017   MCV 101.8 (H) 08/17/2017   PLT 194 08/17/2017     Chemistry      Component Value Date/Time   NA 147 (H) 04/13/2017 0936   K 4.4 04/13/2017 0936   CL 108 04/13/2017 0936   CO2 33 04/13/2017 0936   BUN 13 04/13/2017 0936   CREATININE 1.5 (H) 04/13/2017 0936   GLU 88 02/01/2016      Component Value Date/Time   CALCIUM 10.0 04/13/2017 0936   ALKPHOS 51 04/13/2017 0936   AST 30 04/13/2017 0936   ALT 34 04/13/2017 0936   BILITOT 0.90 04/13/2017 0936         Impression and Plan: Randall Scott is A 67 year old male. He has a Factor 5 Leiden mutation. He is heterozygous for this.  We will continue him on the Xarelto.  I suspect that he will always have this thrombus in the saphenous vein.  This is a superficial thrombus.  I do not think that this will cause him any problems.  I will see him back in August.  At that time, we will switch his dose of Xarelto to 10 mg daily for 1 year.  I talked to him about this.  He is in agreement.  Volanda Napoleon, MD 2/15/201910:20 AM

## 2017-09-04 ENCOUNTER — Other Ambulatory Visit: Payer: Self-pay

## 2017-09-04 DIAGNOSIS — I8290 Acute embolism and thrombosis of unspecified vein: Secondary | ICD-10-CM

## 2017-09-04 MED ORDER — RIVAROXABAN 20 MG PO TABS: 20.0000 mg | ORAL_TABLET | Freq: Every day | ORAL | 1 refills | Status: DC

## 2017-09-12 NOTE — Progress Notes (Signed)
[facility] [address] [place], [place], [place] [address] [phone] [id number]  Date:  09/13/2017   Name:  Randall Scott   DOB:  29-May-1951   MRN:  [id number]  PCP:  Darreld Mclean, MD    Chief Complaint: Shoulder Pain (Pt here for right shoulder pain. )   History of Present Illness:  Randall Scott is a 67 y.o. very pleasant male patient who presents with the following:  History of DVT/ factor V leiden Here today with concern of right shoulder pain - this has been present for several months but is getting worse, he is now having a hard time using his right arm (he is right handed) Dr. Trudie Reed did a steroid shot for him but it did not seem to help.  He had the shot approx 4-5 months ago.  The pain is in the anterior shoulder and can run down to the wrist with certain movememtns He is not aware of any particular injury, he had thought it was just part of his arthritis issues at first so he did not act on it right away  He does notice weakness of the shoulder and arm.  He has started using his left arm to do things he would generally do with his right arm He has a hard time dressed.   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  Patient Active Problem List   Diagnosis Date Noted  . Factor V Leiden mutation ([place])  04/07/2017  . Deep vein thrombosis (DVT) of proximal vein of right lower extremity ([place]) 03/01/2017  . Elevated PSA 06/28/2016  . 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 Headache(784.0) 08/21/2013  . Lateral epicondylitis of right elbow 04/01/2013    Past Medical History:  Diagnosis Date  . Allergy   . Arthritis   . Asthma   . Cataract     Past Surgical History:  Procedure Laterality Date  . EYE SURGERY    . HERNIA REPAIR      Social History   Tobacco Use  . Smoking status: Never Smoker  . Smokeless tobacco: Never Used  Substance Use Topics  . Alcohol use: No  . Drug use: No    Family History  Problem Relation Age of Onset  . Lung cancer Mother   . Lung cancer Father   . Sudden death Neg Hx   . Hypertension Neg Hx   . Hyperlipidemia Neg Hx   . Heart attack Neg Hx   . Diabetes Neg Hx     No Known Allergies  Medication list has been reviewed and updated.  Current Outpatient Medications on File Prior to Visit  Medication Sig Dispense Refill  .  fluticasone (FLONASE) 50 MCG/ACT nasal spray SHAKE LIQUID AND USE 2 SPRAYS IN EACH NOSTRIL DAILY 48 g 1  . folic acid (FOLVITE) 1 MG tablet Take 1 mg by mouth daily.    . methotrexate 2.5 MG tablet Take by mouth once a week.    . naproxen (NAPROSYN) 500 MG tablet Take 500 mg by mouth 2 (two) times daily as needed.    . rivaroxaban (XARELTO) 20 MG TABS tablet Take 1 tablet (20 mg total) by mouth daily with supper. 90 tablet 1  . vitamin A 10000 UNIT capsule Take 10,000 Units by mouth daily.     No current facility-administered medications on file prior to visit.     Review of Systems:  As per HPI- otherwise negative.   Physical Examination: Vitals:   09/13/17 1507  BP: 122/84  Pulse: 79  Temp: 98 F (36.7 C)  SpO2: 98%   Vitals:   09/13/17 1507  Weight: 175 lb (79.4 kg)  Height: 5\' 8"  (1.727 m)   Body mass index is 26.61 kg/m. Ideal Body Weight: Weight in (lb) to have BMI = 25: 164.1  GEN: WDWN, NAD,  Non-toxic, A & O x 3, looks well, normal weight HEENT: Atraumatic, Normocephalic. Neck supple. No masses, No LAD. Ears and Nose: No external deformity. CV: RRR, No M/G/R. No JVD. No thrill. No extra heart sounds. PULM: CTA B, no wheezes, crackles, rhonchi. No retractions. No resp. distress. No accessory muscle use. EXTR: No c/c/e NEURO Normal gait.  PSYCH: Normally interactive. Conversant. Not depressed or anxious appearing.  Calm demeanor.  Right shoulder: he is only able to flex and abduct to 90 at best.  Passive ROM is not much better due to pain. Restricted external rotation and minimal internal rotation.  No definite weakness of the shoulder noted however.   He has mild tenderness over the anterior RCT insertion Normal DTR and sensation of both arms.   Assessment and Plan: Chronic right shoulder pain - Plan: Ambulatory referral to Orthopedic Surgery  Adhesive capsulitis of right shoulder - Plan: Ambulatory referral to Orthopedic Surgery  Shoulder pain for several months, now with restricted ROM. Suspect a partial RCT tear with some element of adhesive capsulitis He has lost significant function already- needs ortho eval asap Will make referral.  Went over basic passive ROM exercises for him to do in the meantime   Signed Lamar Blinks, MD

## 2017-09-13 ENCOUNTER — Ambulatory Visit (INDEPENDENT_AMBULATORY_CARE_PROVIDER_SITE_OTHER): Payer: PPO | Admitting: Family Medicine

## 2017-09-13 ENCOUNTER — Encounter: Payer: Self-pay | Admitting: Family Medicine

## 2017-09-13 VITALS — BP 122/84 | HR 79 | Temp 98.0°F | Ht 68.0 in | Wt 175.0 lb

## 2017-09-13 DIAGNOSIS — G8929 Other chronic pain: Secondary | ICD-10-CM

## 2017-09-13 DIAGNOSIS — M25511 Pain in right shoulder: Secondary | ICD-10-CM | POA: Diagnosis not present

## 2017-09-13 DIAGNOSIS — M7501 Adhesive capsulitis of right shoulder: Secondary | ICD-10-CM | POA: Diagnosis not present

## 2017-09-13 NOTE — Patient Instructions (Signed)
Good to see you today- we are going to set you up to see a shoulder surgeon asap.   In the meantime please do the pendulum swing range of motion exercises and wall climb exercise we discussed.  Let me know if you don't hear about your appt in a few days

## 2017-09-21 DIAGNOSIS — M7501 Adhesive capsulitis of right shoulder: Secondary | ICD-10-CM | POA: Diagnosis not present

## 2017-09-26 DIAGNOSIS — M7501 Adhesive capsulitis of right shoulder: Secondary | ICD-10-CM | POA: Diagnosis not present

## 2017-09-26 DIAGNOSIS — M25511 Pain in right shoulder: Secondary | ICD-10-CM | POA: Diagnosis not present

## 2017-09-27 DIAGNOSIS — M7501 Adhesive capsulitis of right shoulder: Secondary | ICD-10-CM | POA: Diagnosis not present

## 2017-09-27 DIAGNOSIS — M25511 Pain in right shoulder: Secondary | ICD-10-CM | POA: Diagnosis not present

## 2017-10-02 DIAGNOSIS — M25511 Pain in right shoulder: Secondary | ICD-10-CM | POA: Diagnosis not present

## 2017-10-02 DIAGNOSIS — M7501 Adhesive capsulitis of right shoulder: Secondary | ICD-10-CM | POA: Diagnosis not present

## 2017-10-04 DIAGNOSIS — M7501 Adhesive capsulitis of right shoulder: Secondary | ICD-10-CM | POA: Diagnosis not present

## 2017-10-04 DIAGNOSIS — M25511 Pain in right shoulder: Secondary | ICD-10-CM | POA: Diagnosis not present

## 2017-10-09 DIAGNOSIS — M7501 Adhesive capsulitis of right shoulder: Secondary | ICD-10-CM | POA: Diagnosis not present

## 2017-10-09 DIAGNOSIS — M25511 Pain in right shoulder: Secondary | ICD-10-CM | POA: Diagnosis not present

## 2017-10-11 DIAGNOSIS — M7501 Adhesive capsulitis of right shoulder: Secondary | ICD-10-CM | POA: Diagnosis not present

## 2017-10-11 DIAGNOSIS — M25511 Pain in right shoulder: Secondary | ICD-10-CM | POA: Diagnosis not present

## 2017-10-16 DIAGNOSIS — M7501 Adhesive capsulitis of right shoulder: Secondary | ICD-10-CM | POA: Diagnosis not present

## 2017-10-16 DIAGNOSIS — M25511 Pain in right shoulder: Secondary | ICD-10-CM | POA: Diagnosis not present

## 2017-10-17 DIAGNOSIS — L409 Psoriasis, unspecified: Secondary | ICD-10-CM | POA: Diagnosis not present

## 2017-10-17 DIAGNOSIS — Z79899 Other long term (current) drug therapy: Secondary | ICD-10-CM | POA: Diagnosis not present

## 2017-10-17 DIAGNOSIS — M2550 Pain in unspecified joint: Secondary | ICD-10-CM | POA: Diagnosis not present

## 2017-10-17 DIAGNOSIS — M545 Low back pain: Secondary | ICD-10-CM | POA: Diagnosis not present

## 2017-10-17 DIAGNOSIS — L4050 Arthropathic psoriasis, unspecified: Secondary | ICD-10-CM | POA: Diagnosis not present

## 2017-10-17 DIAGNOSIS — M7501 Adhesive capsulitis of right shoulder: Secondary | ICD-10-CM | POA: Diagnosis not present

## 2017-10-17 DIAGNOSIS — M4690 Unspecified inflammatory spondylopathy, site unspecified: Secondary | ICD-10-CM | POA: Diagnosis not present

## 2017-10-17 DIAGNOSIS — E663 Overweight: Secondary | ICD-10-CM | POA: Diagnosis not present

## 2017-10-17 DIAGNOSIS — Z6826 Body mass index (BMI) 26.0-26.9, adult: Secondary | ICD-10-CM | POA: Diagnosis not present

## 2017-10-17 DIAGNOSIS — Z1589 Genetic susceptibility to other disease: Secondary | ICD-10-CM | POA: Diagnosis not present

## 2017-10-18 DIAGNOSIS — M25511 Pain in right shoulder: Secondary | ICD-10-CM | POA: Diagnosis not present

## 2017-10-18 DIAGNOSIS — M7501 Adhesive capsulitis of right shoulder: Secondary | ICD-10-CM | POA: Diagnosis not present

## 2017-10-23 DIAGNOSIS — M25511 Pain in right shoulder: Secondary | ICD-10-CM | POA: Diagnosis not present

## 2017-10-23 DIAGNOSIS — M7501 Adhesive capsulitis of right shoulder: Secondary | ICD-10-CM | POA: Diagnosis not present

## 2017-10-25 ENCOUNTER — Other Ambulatory Visit: Payer: Self-pay | Admitting: Medical

## 2017-10-25 ENCOUNTER — Ambulatory Visit (INDEPENDENT_AMBULATORY_CARE_PROVIDER_SITE_OTHER): Payer: PPO | Admitting: Medical

## 2017-10-25 ENCOUNTER — Encounter: Payer: Self-pay | Admitting: Medical

## 2017-10-25 VITALS — BP 125/80 | HR 70 | Temp 97.9°F | Resp 16 | Ht 68.0 in | Wt 180.6 lb

## 2017-10-25 DIAGNOSIS — R1013 Epigastric pain: Secondary | ICD-10-CM | POA: Diagnosis not present

## 2017-10-25 DIAGNOSIS — M25511 Pain in right shoulder: Secondary | ICD-10-CM | POA: Diagnosis not present

## 2017-10-25 DIAGNOSIS — M7501 Adhesive capsulitis of right shoulder: Secondary | ICD-10-CM | POA: Diagnosis not present

## 2017-10-25 DIAGNOSIS — R0789 Other chest pain: Secondary | ICD-10-CM

## 2017-10-25 LAB — CBC WITH DIFFERENTIAL/PLATELET
BASOS PCT: 0.6 % (ref 0.0–3.0)
Basophils Absolute: 0 10*3/uL (ref 0.0–0.1)
EOS ABS: 0.1 10*3/uL (ref 0.0–0.7)
Eosinophils Relative: 1.2 % (ref 0.0–5.0)
HEMATOCRIT: 40.6 % (ref 39.0–52.0)
Hemoglobin: 13.9 g/dL (ref 13.0–17.0)
LYMPHS PCT: 16.4 % (ref 12.0–46.0)
Lymphs Abs: 1.3 10*3/uL (ref 0.7–4.0)
MCHC: 34.3 g/dL (ref 30.0–36.0)
MCV: 102.5 fl — ABNORMAL HIGH (ref 78.0–100.0)
Monocytes Absolute: 0.3 10*3/uL (ref 0.1–1.0)
Monocytes Relative: 3.5 % (ref 3.0–12.0)
Neutro Abs: 6 10*3/uL (ref 1.4–7.7)
Neutrophils Relative %: 78.3 % — ABNORMAL HIGH (ref 43.0–77.0)
Platelets: 192 10*3/uL (ref 150.0–400.0)
RBC: 3.96 Mil/uL — AB (ref 4.22–5.81)
RDW: 14.5 % (ref 11.5–15.5)
WBC: 7.7 10*3/uL (ref 4.0–10.5)

## 2017-10-25 LAB — COMPREHENSIVE METABOLIC PANEL
ALT: 26 U/L (ref 0–53)
AST: 24 U/L (ref 0–37)
Albumin: 4.1 g/dL (ref 3.5–5.2)
Alkaline Phosphatase: 49 U/L (ref 39–117)
BILIRUBIN TOTAL: 0.6 mg/dL (ref 0.2–1.2)
BUN: 18 mg/dL (ref 6–23)
CALCIUM: 9.1 mg/dL (ref 8.4–10.5)
CO2: 30 mEq/L (ref 19–32)
CREATININE: 1.23 mg/dL (ref 0.40–1.50)
Chloride: 106 mEq/L (ref 96–112)
GFR: 62.35 mL/min (ref >60.00)
Glucose, Bld: 136 mg/dL — ABNORMAL HIGH (ref 70–99)
Potassium: 4.4 mEq/L (ref 3.5–5.1)
SODIUM: 141 meq/L (ref 135–145)
TOTAL PROTEIN: 6.3 g/dL (ref 6.0–8.3)

## 2017-10-25 LAB — TROPONIN I: TNIDX: 0 ug/L (ref 0.00–0.06)

## 2017-10-25 LAB — AMYLASE: AMYLASE: 42 U/L (ref 27–131)

## 2017-10-25 LAB — LIPASE: LIPASE: 15 U/L (ref 11.0–59.0)

## 2017-10-25 MED ORDER — GI COCKTAIL ~~LOC~~
30.0000 mL | Freq: Once | ORAL | Status: AC
  Administered 2017-10-25: 30 mL via ORAL

## 2017-10-25 MED ORDER — RANITIDINE HCL 150 MG PO CAPS: 150.0000 mg | ORAL_CAPSULE | Freq: Two times a day (BID) | ORAL | 0 refills | Status: DC

## 2017-10-25 NOTE — Progress Notes (Signed)
Subjective:    Patient ID: Randall Scott, male    DOB: [birth year], 67 y.o.   MRN: [id number]  HPI  Pt in states last night he woke up with some epigastric area pain that has been up to his lower chest. He states pain last for about on hour last night.   Then this morning around 9:30 pain in abdomen and some pain in lower chest. He points to xiphoid process/lower sternum area.  Pain did stop around 11:30. Now only faint epigastric are pain now. Discomfort now 1/10.  Last night discomfort was 6-7/10 pain.   No back pain. No left shoulder pain, no arm pain. No shortness of breath. No nausea or vomiting last night.   Early today some nausea.  Pt lipid panel looked good 6 months ago.  Pt never had ekg and no cardiologist appointments.  Pt does report some heart burn recently. Early in evening yesterday had some burning/acid feel near upper throat. He took tums last night and symptoms resolved around 9 pm.  No hx of smoking. No fh MI or strokes. Pt not diabetic.  Pt called MD live today at 11:15 and they recommended ED. Pt declined and came here instead.    Review of Systems  Constitutional: Negative for chills and fever.  Respiratory: Negative for cough, chest tightness, shortness of breath and wheezing.   Cardiovascular: Negative for chest pain and palpitations.       Very atypical pain that seems mostly likely from stomach./not cardiac.  Gastrointestinal: Positive for abdominal pain. Negative for abdominal distention, blood in stool and constipation.       Faint epigastric pain now.  Musculoskeletal: Negative for back pain and neck pain.  Skin: Negative for rash.  Hematological: Negative for adenopathy. Does not bruise/bleed easily.  Psychiatric/Behavioral: Negative for behavioral problems and confusion.    Past Medical History:  Diagnosis Date  . Allergy   . Arthritis   . Asthma   . Cataract      Social History   Socioeconomic History  . Marital status: Married    Spouse name: Caren Griffins  . Number of children: 2  . Years of education: 48  . Highest education level: Not on file  Occupational History  . Occupation: Information systems manager: [employer]  Social Needs  . Financial resource strain: Not on file  . Food insecurity:    Worry: Not on file    Inability: Not on file  . Transportation needs:    Medical: Not on file    Non-medical: Not on file  Tobacco Use  . Smoking status: Never Smoker  . Smokeless tobacco: Never Used  Substance and Sexual Activity  . Alcohol use: No  . Drug use: No  . Sexual activity: Yes    Birth control/protection: None    Comment: married  Lifestyle  . Physical activity:    Days per week: Not on file    Minutes per session: Not on file  . Stress: Not on file  Relationships  . Social connections:    Talks on phone: Not on file    Gets together: Not on file    Attends religious service: Not on file    Active member of club or organization: Not on file    Attends meetings of clubs or organizations: Not on file    Relationship status: Not on file  . Intimate partner violence:    Fear of current or ex partner: Not on file  Emotionally abused: Not on file    Physically abused: Not on file    Forced sexual activity: Not on file  Other Topics Concern  . Not on file  Social History Narrative   Lives with his wife.  Daughters are grown and live nearby.   Patient is right handed   Education level is 12   Caffeine consumption is 2-3 cups daily    Past Surgical History:  Procedure Laterality Date  . EYE SURGERY    . HERNIA REPAIR      Family History  Problem Relation Age of Onset  . Lung cancer Mother   . Lung cancer Father   . Sudden death Neg Hx   . Hypertension Neg Hx   . Hyperlipidemia Neg Hx   . Heart attack Neg Hx   . Diabetes Neg Hx     No Known Allergies  Current Outpatient Medications on File Prior to Visit  Medication Sig Dispense Refill  . Etanercept (ENBREL MINI) 50  MG/ML SOCT Inject 50 mg into the skin.    . fluticasone (FLONASE) 50 MCG/ACT nasal spray SHAKE LIQUID AND USE 2 SPRAYS IN EACH NOSTRIL DAILY 48 g 1  . folic acid (FOLVITE) 1 MG tablet Take 1 mg by mouth daily.    . methotrexate 2.5 MG tablet Take by mouth once a week.    . naproxen (NAPROSYN) 500 MG tablet Take 500 mg by mouth 2 (two) times daily as needed.    . rivaroxaban (XARELTO) 20 MG TABS tablet Take 1 tablet (20 mg total) by mouth daily with supper. 90 tablet 1  . vitamin A 10000 UNIT capsule Take 10,000 Units by mouth daily.     No current facility-administered medications on file prior to visit.     BP 125/80   Pulse 70   Temp 97.9 F (36.6 C) (Oral)   Resp 16   Ht 5\' 8"  (1.727 m)   Wt 180 lb 9.6 oz (81.9 kg)   SpO2 98%   BMI 27.46 kg/m       Objective:   Physical Exam  General Mental Status- Alert. General Appearance- Not in acute distress.   Skin General: Color- Normal Color. Moisture- Normal Moisture.  Neck Carotid Arteries- Normal color. Moisture- Normal Moisture. No carotid bruits. No JVD.  Chest and Lung Exam Auscultation: Breath Sounds:-Normal.  Cardiovascular Auscultation:Rythm- Regular. Murmurs & Other Heart Sounds:Auscultation of the heart reveals- No Murmurs.  Abdomen Inspection:-Inspeection Normal. Palpation/Percussion:Note:No mass. Palpation and Percussion of the abdomen reveal-faint 1+ epigastric tender, Non Distended + BS, no rebound or guarding.    Neurologic Cranial Nerve exam:- CN III-XII intact(No nystagmus), symmetric smile. Strength:- 5/5 equal and symmetric strength both upper and lower extremities.      Assessment & Plan:  For your recent epigastric and lower atypical sternum/chest region pain, I did talk with Dr. Lorelei Pont and she is in agreement that the most conservative approach would be emergency department evaluation.  Since you declined that earlier today, we decided we could get one set of troponin stat.  Also I would  include CBC, metabolic panel, amylase and lipase studies.  As we discussed one set of troponin cannot clear you completely as you had some recent lower chest region pain around 11:30 PM.  No pain presently and your EKG looks very good.  Also your family history and lack of risk factors is in your favor.  However if your abdomen pain returns as you described earlier today and last night with any  lower chest pain then you would definitely need to be evaluated in the emergency department for 2-3 sets of troponin.  I did prescribe you ranitidine to start in the event that your atypical chest pain is from reflux.  Also recommend eating very healthy diet, avoid greasy foods and caffeinated beverages.  Follow-up in 7 to 10 days or as needed.  If you are out of town/[place] and symptoms recur as described before then recommend ED evaluation there as well.  Discussed patient's presentation and complaint with Dr. Lorelei Pont his PCP.  40 minutes total spent on visit.  50% of time spent counseling patient on differential diagnosis, work-up today, contingency plan if pain were to worsen again.  Time spent counseling patient on difficulty of ruling out heart condition with only one set of troponin under the circumstances.  Patient counseled patient being seen in the emergency department after hours in the event return.  In addition counseled on plan [place] in the symptoms return when on vacation.

## 2017-10-25 NOTE — Patient Instructions (Addendum)
For your recent epigastric and lower atypical sternum/chest region pain, I did talk with Dr. Lorelei Pont and she is in agreement that the most conservative approach would be emergency department evaluation.  Since you declined that earlier today, we decided we could get one set of troponin stat.  Also I would include CBC, metabolic panel, amylase and lipase studies.  As we discussed one set of troponin cannot clear you completely as you had some recent lower chest region pain around 11:30 PM.  No pain presently and your EKG looks very good(normal sinus rhythm).  Also your family history and lack of risk factors is in your favor.  However if your abdomen pain returns as you described earlier today and last night with any lower chest pain then you would definitely need to be evaluated in the emergency department for 2-3 sets of troponin.  I did prescribe you ranitidine to start in the event that your atypical chest pain is from reflux.  Also recommend eating very healthy diet, avoid greasy foods and caffeinated beverages.  Follow-up in 7 to 10 days or as needed.  If you are out of town/[place] and symptoms recur as described before then recommend ED evaluation there as wel  Mackie Pai, PA-Cl.

## 2017-11-07 DIAGNOSIS — M25511 Pain in right shoulder: Secondary | ICD-10-CM | POA: Diagnosis not present

## 2017-11-07 DIAGNOSIS — M7501 Adhesive capsulitis of right shoulder: Secondary | ICD-10-CM | POA: Diagnosis not present

## 2017-11-12 DIAGNOSIS — M7501 Adhesive capsulitis of right shoulder: Secondary | ICD-10-CM | POA: Diagnosis not present

## 2017-11-12 DIAGNOSIS — M25511 Pain in right shoulder: Secondary | ICD-10-CM | POA: Diagnosis not present

## 2017-11-22 DIAGNOSIS — M7501 Adhesive capsulitis of right shoulder: Secondary | ICD-10-CM | POA: Diagnosis not present

## 2017-11-22 DIAGNOSIS — M25511 Pain in right shoulder: Secondary | ICD-10-CM | POA: Diagnosis not present

## 2018-01-10 DIAGNOSIS — M4690 Unspecified inflammatory spondylopathy, site unspecified: Secondary | ICD-10-CM | POA: Diagnosis not present

## 2018-01-10 DIAGNOSIS — M545 Low back pain: Secondary | ICD-10-CM | POA: Diagnosis not present

## 2018-01-10 DIAGNOSIS — M2550 Pain in unspecified joint: Secondary | ICD-10-CM | POA: Diagnosis not present

## 2018-01-10 DIAGNOSIS — L409 Psoriasis, unspecified: Secondary | ICD-10-CM | POA: Diagnosis not present

## 2018-01-10 DIAGNOSIS — M7501 Adhesive capsulitis of right shoulder: Secondary | ICD-10-CM | POA: Diagnosis not present

## 2018-01-10 DIAGNOSIS — L4050 Arthropathic psoriasis, unspecified: Secondary | ICD-10-CM | POA: Diagnosis not present

## 2018-01-10 DIAGNOSIS — Z79899 Other long term (current) drug therapy: Secondary | ICD-10-CM | POA: Diagnosis not present

## 2018-01-10 DIAGNOSIS — E663 Overweight: Secondary | ICD-10-CM | POA: Diagnosis not present

## 2018-01-10 DIAGNOSIS — Z1589 Genetic susceptibility to other disease: Secondary | ICD-10-CM | POA: Diagnosis not present

## 2018-01-10 DIAGNOSIS — Z6826 Body mass index (BMI) 26.0-26.9, adult: Secondary | ICD-10-CM | POA: Diagnosis not present

## 2018-01-16 DIAGNOSIS — M7501 Adhesive capsulitis of right shoulder: Secondary | ICD-10-CM | POA: Diagnosis not present

## 2018-01-17 DIAGNOSIS — M7501 Adhesive capsulitis of right shoulder: Secondary | ICD-10-CM | POA: Diagnosis not present

## 2018-01-23 DIAGNOSIS — M7501 Adhesive capsulitis of right shoulder: Secondary | ICD-10-CM | POA: Diagnosis not present

## 2018-01-25 DIAGNOSIS — M7501 Adhesive capsulitis of right shoulder: Secondary | ICD-10-CM | POA: Diagnosis not present

## 2018-01-28 DIAGNOSIS — M7501 Adhesive capsulitis of right shoulder: Secondary | ICD-10-CM | POA: Diagnosis not present

## 2018-01-30 DIAGNOSIS — M7501 Adhesive capsulitis of right shoulder: Secondary | ICD-10-CM | POA: Diagnosis not present

## 2018-02-15 ENCOUNTER — Inpatient Hospital Stay: Payer: PPO

## 2018-02-15 ENCOUNTER — Other Ambulatory Visit: Payer: Self-pay

## 2018-02-15 ENCOUNTER — Encounter: Payer: Self-pay | Admitting: Hematology & Oncology

## 2018-02-15 ENCOUNTER — Inpatient Hospital Stay: Payer: PPO | Attending: Hematology & Oncology | Admitting: Hematology & Oncology

## 2018-02-15 VITALS — BP 125/73 | HR 52 | Resp 18 | Wt 165.0 lb

## 2018-02-15 DIAGNOSIS — Z7901 Long term (current) use of anticoagulants: Secondary | ICD-10-CM | POA: Insufficient documentation

## 2018-02-15 DIAGNOSIS — I8290 Acute embolism and thrombosis of unspecified vein: Secondary | ICD-10-CM

## 2018-02-15 DIAGNOSIS — Z79899 Other long term (current) drug therapy: Secondary | ICD-10-CM | POA: Insufficient documentation

## 2018-02-15 DIAGNOSIS — D6851 Activated protein C resistance: Secondary | ICD-10-CM

## 2018-02-15 DIAGNOSIS — I824Y1 Acute embolism and thrombosis of unspecified deep veins of right proximal lower extremity: Secondary | ICD-10-CM

## 2018-02-15 LAB — CBC WITH DIFFERENTIAL (CANCER CENTER ONLY)
Basophils Absolute: 0 10*3/uL (ref 0.0–0.1)
Basophils Relative: 1 %
Eosinophils Absolute: 0.2 10*3/uL (ref 0.0–0.5)
Eosinophils Relative: 3 %
HCT: 44 % (ref 38.7–49.9)
HEMOGLOBIN: 15.2 g/dL (ref 13.0–17.1)
LYMPHS PCT: 27 %
Lymphs Abs: 1.7 10*3/uL (ref 0.9–3.3)
MCH: 35.1 pg — ABNORMAL HIGH (ref 28.0–33.4)
MCHC: 34.5 g/dL (ref 32.0–35.9)
MCV: 101.6 fL — AB (ref 82.0–98.0)
MONOS PCT: 8 %
Monocytes Absolute: 0.5 10*3/uL (ref 0.1–0.9)
NEUTROS PCT: 61 %
Neutro Abs: 3.9 10*3/uL (ref 1.5–6.5)
Platelet Count: 177 10*3/uL (ref 145–400)
RBC: 4.33 MIL/uL (ref 4.20–5.70)
RDW: 12.9 % (ref 11.1–15.7)
WBC: 6.3 10*3/uL (ref 4.0–10.0)

## 2018-02-15 LAB — CMP (CANCER CENTER ONLY)
ALK PHOS: 62 U/L (ref 38–126)
ALT: 22 U/L (ref 0–44)
AST: 22 U/L (ref 15–41)
Albumin: 4.4 g/dL (ref 3.5–5.0)
Anion gap: 8 (ref 5–15)
BILIRUBIN TOTAL: 0.8 mg/dL (ref 0.3–1.2)
BUN: 16 mg/dL (ref 8–23)
CALCIUM: 9.5 mg/dL (ref 8.9–10.3)
CO2: 28 mmol/L (ref 22–32)
CREATININE: 1.22 mg/dL (ref 0.61–1.24)
Chloride: 109 mmol/L (ref 98–111)
GFR, EST NON AFRICAN AMERICAN: 60 mL/min — AB (ref >60)
Glucose, Bld: 99 mg/dL (ref 70–99)
Potassium: 5.2 mmol/L — ABNORMAL HIGH (ref 3.5–5.1)
Sodium: 145 mmol/L (ref 135–145)
TOTAL PROTEIN: 7.1 g/dL (ref 6.5–8.1)

## 2018-02-15 MED ORDER — RIVAROXABAN 10 MG PO TABS: 10.0000 mg | ORAL_TABLET | Freq: Every day | ORAL | 11 refills | Status: DC

## 2018-02-15 NOTE — Progress Notes (Signed)
Hematology and Oncology Follow Up Visit  Randall Scott [id number] September 20, 1950 67 y.o. 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 daily-maintenance therapy for 1 year-complete in August 2020      Interim History:  Randall Scott is back for follow-up. We did find that he has the factor V Leiden mutation. He is heterozygous for this.  He is been quite busy since we last saw him.  He has been traveling.  He has been down to [place], [place].  He was down in [place].  He really had a nice time.  He has had no problems with his right leg.  He has had no swelling.  He has had no pain.  He is on Xarelto 20 mg daily.  We will now switch him over to 10 mg for maintenance therapy for 1 year.  He has had no bleeding.  He has had no change in bowel or bladder habits.  He has had no cough or shortness of breath.  There is been no chest wall pain.  Overall, his performance status is ECOG 0.    Medications:  Current Outpatient Medications:  .  Etanercept (ENBREL MINI) 50 MG/ML SOCT, Inject 50 mg into the skin., Disp: , Rfl:  .  fluticasone (FLONASE) 50 MCG/ACT nasal spray, SHAKE LIQUID AND USE 2 SPRAYS IN EACH NOSTRIL DAILY, Disp: 48 g, Rfl: 1 .  folic acid (FOLVITE) 1 MG tablet, Take 1 mg by mouth daily., Disp: , Rfl:  .  methotrexate 2.5 MG tablet, Take by mouth once a week., Disp: , Rfl:  .  naproxen (NAPROSYN) 500 MG tablet, Take 500 mg by mouth 2 (two) times daily as needed., Disp: , Rfl:  .  ranitidine (ZANTAC) 150 MG capsule, TAKE 1 CAPSULE(150 MG) BY MOUTH TWICE DAILY, Disp: 180 capsule, Rfl: 0 .  rivaroxaban (XARELTO) 20 MG TABS tablet, Take 1 tablet (20 mg total) by mouth daily with supper., Disp: 90 tablet, Rfl: 1 .  vitamin A 10000 UNIT capsule, Take 10,000 Units by mouth daily.,  Disp: , Rfl:   Allergies: No Known Allergies  Past Medical History, Surgical history, Social history, and Family History were reviewed and updated.  Review of Systems: Review of Systems  Constitutional: Negative for appetite change, fatigue, fever and unexpected weight change.  HENT:   Negative for lump/mass, mouth sores, sore throat and trouble swallowing.   Respiratory: Negative for cough, hemoptysis and shortness of breath.   Cardiovascular: Negative for leg swelling and palpitations.  Gastrointestinal: Negative for abdominal distention, abdominal pain, blood in stool, constipation, diarrhea, nausea and vomiting.  Genitourinary: Negative for bladder incontinence, dysuria, frequency and hematuria.   Musculoskeletal: Negative for arthralgias, back pain, gait problem and myalgias.  Skin: Negative for itching and rash.  Neurological: Negative for dizziness, extremity weakness, gait problem, headaches, numbness, seizures and speech difficulty.  Hematological: Does not bruise/bleed easily.  Psychiatric/Behavioral: Negative for depression and sleep disturbance. The patient is not nervous/anxious.     Physical Exam:  weight is 165 lb (74.8 kg). His blood pressure is 125/73 and his pulse is 52 (abnormal). His respiration is 18 and oxygen saturation is 100%.   Wt Readings from Last 3 Encounters:  02/15/18 165 lb (74.8 kg)  10/25/17 180 lb 9.6 oz (81.9 kg)  09/13/17 175 lb (79.4 kg)    Physical Exam  Constitutional: He is oriented to person, place, and time.  HENT:  Head: Normocephalic and atraumatic.  Mouth/Throat: Oropharynx is clear and moist.  Eyes: Pupils are equal, round, and reactive to light. EOM are normal.  Neck: Normal range of motion.  Cardiovascular: Normal rate, regular rhythm and normal heart sounds.  Pulmonary/Chest: Effort normal and breath sounds normal.  Abdominal: Soft. Bowel sounds are normal.  Musculoskeletal: Normal range of motion. He exhibits no edema,  tenderness or deformity.  Lymphadenopathy:    He has no cervical adenopathy.  Neurological: He is alert and oriented to person, place, and time.  Skin: Skin is warm and dry. No rash noted. No erythema.  Psychiatric: He has a normal mood and affect. His behavior is normal. Judgment and thought content normal.  Vitals reviewed.    Lab Results  Component Value Date   WBC 6.3 02/15/2018   HGB 15.2 02/15/2018   HCT 44.0 02/15/2018   MCV 101.6 (H) 02/15/2018   PLT 177 02/15/2018     Chemistry      Component Value Date/Time   NA 141 10/25/2017 1413   NA 147 (H) 04/13/2017 0936   K 4.4 10/25/2017 1413   K 4.4 04/13/2017 0936   CL 106 10/25/2017 1413   CL 108 04/13/2017 0936   CO2 30 10/25/2017 1413   CO2 33 04/13/2017 0936   BUN 18 10/25/2017 1413   BUN 13 04/13/2017 0936   CREATININE 1.23 10/25/2017 1413   CREATININE 1.17 08/17/2017 0920   CREATININE 1.5 (H) 04/13/2017 0936   GLU 88 02/01/2016      Component Value Date/Time   CALCIUM 9.1 10/25/2017 1413   CALCIUM 10.0 04/13/2017 0936   ALKPHOS 49 10/25/2017 1413   ALKPHOS 51 04/13/2017 0936   AST 24 10/25/2017 1413   AST 24 08/17/2017 0920   ALT 26 10/25/2017 1413   ALT 31 08/17/2017 0920   ALT 34 04/13/2017 0936   BILITOT 0.6 10/25/2017 1413   BILITOT 0.6 08/17/2017 0920         Impression and Plan: Randall Scott is a 67 year old male. He has a Factor 5 Leiden mutation. He is heterozygous for this.  I will go ahead and make the change to 10 mg daily of Xarelto.  I think this would be reasonable for 1 year for maintenance therapy.  I will get him back in 6 months.  I told him to make sure that he drinks a lot of fluid.  Volanda Napoleon, MD 8/16/201910:48 AM

## 2018-04-04 NOTE — Progress Notes (Signed)
Subjective:   Randall Scott is a 67 y.o. male who presents for Medicare Annual/Subsequent preventive examination.  Review of Systems: No ROS.  Medicare Wellness Visit. Additional risk factors are reflected in the social history. Cardiac Risk Factors include: male gender;advanced age (>41men, >15 women) Sleep patterns: No issues. Home Safety/Smoke Alarms: Feels safe in home. Smoke alarms in place. Lives with wife. No stairs.  Male:   CCS- next due  03/2023 PSA-  Lab Results  Component Value Date   PSA 2.15 04/09/2017   PSA 4.44 (H) 04/06/2016   PSA 1.40 05/10/2015       Objective:    Vitals: BP 134/82 (BP Location: Left Arm, Patient Position: Sitting, Cuff Size: Normal)   Pulse 65   Ht 5\' 8"  (1.727 m)   Wt 165 lb 6.4 oz (75 kg)   SpO2 96%   BMI 25.15 kg/m   Body mass index is 25.15 kg/m.  Advanced Directives 04/11/2018 02/15/2018 04/13/2017 04/09/2017  Does Patient Have a Medical Advance Directive? Yes Yes Yes Yes  Type of Paramedic of [place];Living will Living will [place];Living will Living will;Healthcare Power of [place] in Chart? No - copy requested - - No - copy requested    Tobacco Social History   Tobacco Use  Smoking Status Never Smoker  Smokeless Tobacco Never Used     Counseling given: Not Answered   Clinical Intake: Pain : No/denies pain    Past Medical History:  Diagnosis Date  . Allergy   . Arthritis   . Asthma   . Cataract    Past Surgical History:  Procedure Laterality Date  . EYE SURGERY    . HERNIA REPAIR    . PERIPHERAL VASCULAR THROMBECTOMY Right    RLE   Family History  Problem Relation Age of Onset  . Lung cancer Mother   . Lung cancer Father   . Sudden death Neg Hx   . Hypertension Neg Hx   . Hyperlipidemia Neg Hx   . Heart attack Neg Hx   . Diabetes Neg Hx    Social History   Socioeconomic History  . Marital status: Married   Spouse name: Caren Griffins  . Number of children: 2  . Years of education: 37  . Highest education level: Not on file  Occupational History  . Occupation: Information systems manager: [employer]  Social Needs  . Financial resource strain: Not on file  . Food insecurity:    Worry: Not on file    Inability: Not on file  . Transportation needs:    Medical: Not on file    Non-medical: Not on file  Tobacco Use  . Smoking status: Never Smoker  . Smokeless tobacco: Never Used  Substance and Sexual Activity  . Alcohol use: No  . Drug use: No  . Sexual activity: Yes    Birth control/protection: None    Comment: married  Lifestyle  . Physical activity:    Days per week: Not on file    Minutes per session: Not on file  . Stress: Not on file  Relationships  . Social connections:    Talks on phone: Not on file    Gets together: Not on file    Attends religious service: Not on file    Active member of club or organization: Not on file    Attends meetings of clubs or organizations: Not on file  Relationship status: Not on file  Other Topics Concern  . Not on file  Social History Narrative   Lives with his wife.  Daughters are grown and live nearby.   Patient is right handed   Education level is 12   Caffeine consumption is 2-3 cups daily    Outpatient Encounter Medications as of 04/11/2018  Medication Sig  . folic acid (FOLVITE) 1 MG tablet Take 1 mg by mouth daily.  . methotrexate 2.5 MG tablet Take by mouth once a week.  . naproxen (NAPROSYN) 500 MG tablet Take 500 mg by mouth 2 (two) times daily as needed.  . rivaroxaban (XARELTO) 10 MG TABS tablet Take 1 tablet (10 mg total) by mouth daily with supper.  . vitamin A 10000 UNIT capsule Take 10,000 Units by mouth daily.   No facility-administered encounter medications on file as of 04/11/2018.     Activities of Daily Living In your present state of health, do you have any difficulty performing the following activities:  04/11/2018  Hearing? N  Vision? N  Difficulty concentrating or making decisions? N  Walking or climbing stairs? N  Dressing or bathing? N  Doing errands, shopping? N  Preparing Food and eating ? N  Using the Toilet? N  In the past six months, have you accidently leaked urine? N  Do you have problems with loss of bowel control? N  Managing your Medications? N  Managing your Finances? N  Housekeeping or managing your Housekeeping? N  Some recent data might be hidden    Patient Care Team: Copland, Gay Filler, MD as PCP - General (Family Medicine) Maylon Peppers, MD (Family Medicine) Marin Olp Rudell Cobb, MD as Consulting Physician (Oncology)   Assessment:   This is a routine wellness examination for Randall Scott. Physical assessment deferred to PCP.  Exercise Activities and Dietary recommendations Current Exercise Habits: Home exercise routine, Type of exercise: walking, Time (Minutes): 35, Frequency (Times/Week): 4, Weekly Exercise (Minutes/Week): 140, Intensity: Mild, Exercise limited by: None identified   Diet (meal preparation, eat out, water intake, caffeinated beverages, dairy products, fruits and vegetables): in general, a "healthy" diet  , well balanced   Goals    . maintain current healthy lifestyle (pt-stated)       Fall Risk Fall Risk  04/11/2018 04/09/2017 04/09/2017 04/06/2016  Falls in the past year? No No No No     Depression Screen PHQ 2/9 Scores 04/11/2018 04/09/2017 04/06/2016 04/06/2016  PHQ - 2 Score 0 0 0 0  Exception Documentation - Patient refusal - -    Cognitive Function Ad8 score reviewed for issues:  Issues making decisions:no  Less interest in hobbies / activities:no  Repeats questions, stories (family complaining):no  Trouble using ordinary gadgets (microwave, computer, phone):no  Forgets the month or year: no  Mismanaging finances: no  Remembering appts:no  Daily problems with thinking and/or memory:no Ad8 score is=0   MMSE - Mini Mental  State Exam 04/09/2017  Orientation to time 5  Orientation to Place 5  Registration 3  Attention/ Calculation 5  Recall 3  Language- name 2 objects 2  Language- repeat 1  Language- follow 3 step command 3  Language- read & follow direction 1  Write a sentence 1  Copy design 1  Total score 30        Immunization History  Administered Date(s) Administered  . Influenza Split 07/04/2006  . Influenza, High Dose Seasonal PF 04/06/2016, 04/09/2017  . Influenza,inj,Quad PF,6+ Mos 03/25/2013, 04/21/2014, 05/10/2015  . Pneumococcal  Conjugate-13 04/06/2016  . Pneumococcal Polysaccharide-23 04/09/2017  . Tdap 12/26/2012   Screening Tests Health Maintenance  Topic Date Due  . INFLUENZA VACCINE  01/31/2018  . TETANUS/TDAP  12/27/2022  . COLONOSCOPY  03/06/2023  . Hepatitis C Screening  Completed  . PNA vac Low Risk Adult  Completed        Plan:    Please schedule your next medicare wellness visit with me in 1 yr.  Continue to eat heart healthy diet (full of fruits, vegetables, whole grains, lean protein, water--limit salt, fat, and sugar intake) and increase physical activity as tolerated.  Bring a copy of your living will and/or healthcare power of attorney to your next office visit.  You received your flu shot today.   I have personally reviewed and noted the following in the patient's chart:   . Medical and social history . Use of alcohol, tobacco or illicit drugs  . Current medications and supplements . Functional ability and status . Nutritional status . Physical activity . Advanced directives . List of other physicians . Hospitalizations, surgeries, and ER visits in previous 12 months . Vitals . Screenings to include cognitive, depression, and falls . Referrals and appointments  In addition, I have reviewed and discussed with patient certain preventive protocols, quality metrics, and best practice recommendations. A written personalized care plan for preventive  services as well as general preventive health recommendations were provided to patient.     Shela Nevin, [place]  04/11/2018

## 2018-04-09 NOTE — Progress Notes (Addendum)
[place] at [facility] [address] [place], [place] [address] [phone]  Date:  04/11/2018   Name:  Randall Scott   DOB:  [birth year]   MRN:  [id number]  PCP:  Darreld Mclean, MD    Chief Complaint: Annual Exam and Flu Vaccine (given by angel this morning during awv)   History of Present Illness:  Randall Scott is a 67 y.o. 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 He has to be careful with chewing and swallowing some foods or food may get stuck  Upper GI was done prior to 2014 but he is not quite sure of when  He has noted intermittent hoarse voice for years.  He notes that he will lose his voice easily if he talks too much   He was last seen by Dr. 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 daily-maintenance therapy for 1 year-complete in August 2020                              Interim History:  [name] is back for follow-up. We did find that he has the factor V Leiden mutation. He is heterozygous for this. He is been quite busy since we last saw him.  He has been traveling.  He has been down to [place], [place].  He was down in [place].  He  really had a nice time. He has had no problems with his right leg.  He has had no swelling.  He has had no pain. He is on Xarelto 20 mg daily.  We will now switch him over to 10 mg for maintenance therapy for 1 year.  He did see urology when his PSA went up back in 2017, since then came back down.  He has been released from their care, we will check PSA for him today Flu shot due- done  ?shingrix.  He did have zostavax 7-8 years ago.   He will get this done at drug store at his convenienc Never a smoker  Lab Results  Component Value Date   PSA 2.15 04/09/2017   PSA 4.44 (H) 04/06/2016   PSA 1.40 05/10/2015   He notes weight loss over the last ?6-9 months Wt Readings from Last 3 Encounters:  04/11/18 165 lb (74.8 kg)  04/11/18 165 lb 6.4 oz (75 kg)  02/15/18 165 lb (74.8 kg)   Looking back his weight was 177 December 2017 He notes that his weight was about 172-3 until just recently.  He is not trying to lose weight, but he is pleased with losing a couple of lbs  Patient Active Problem List   Diagnosis Date Noted  . Factor V Leiden mutation ([place]) 04/07/2017  . Deep vein thrombosis (DVT) of proximal vein of right lower extremity ([place]) 03/01/2017  . Elevated PSA 06/28/2016  . 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 Headache(784.0) 08/21/2013  . Lateral epicondylitis of right elbow 04/01/2013    Past Medical History:  Diagnosis Date  . Allergy   . Arthritis   . Asthma   . Cataract     Past Surgical History:  Procedure Laterality Date  . EYE SURGERY    . HERNIA REPAIR    . PERIPHERAL VASCULAR THROMBECTOMY Right    RLE    Social History   Tobacco Use  . Smoking status: Never Smoker  . Smokeless tobacco: Never Used  Substance Use Topics  . Alcohol use: No  . Drug use: No    Family History  Problem Relation Age of Onset  . Lung cancer Mother   . Lung cancer Father   . Sudden death Neg Hx   . Hypertension Neg Hx   . Hyperlipidemia Neg Hx   . Heart attack Neg Hx   .  Diabetes Neg Hx     No Known Allergies  Medication list has been reviewed and updated.  Current Outpatient Medications on File Prior to Visit  Medication Sig Dispense Refill  . folic acid (FOLVITE) 1 MG tablet Take 1 mg by mouth daily.    . methotrexate 2.5 MG tablet Take by mouth once a week.    . naproxen (NAPROSYN) 500 MG tablet Take 500 mg by mouth 2 (two) times daily as needed.    . rivaroxaban (XARELTO) 10 MG TABS tablet Take 1 tablet (10 mg total) by mouth daily with supper. 30 tablet 11  . vitamin A 10000 UNIT capsule Take 10,000 Units by mouth daily.     No current facility-administered medications on file prior to visit.     Review of Systems:  As per HPI- otherwise negative.   Physical Examination: Vitals:   04/11/18 1027  BP: 134/82  Pulse: 65  Resp: 16  SpO2: 96%   Vitals:   04/11/18 1027  Weight: 165 lb (74.8 kg)  Height: 5\' 8"  (1.727 m)   Body mass index is 25.09 kg/m. Ideal Body Weight: Weight in (lb) to have BMI = 25: 164.1  GEN: WDWN, NAD, Non-toxic, A & O x 3, looks well, normal weight  HEENT: Atraumatic, Normocephalic. Neck supple. No masses, No LAD.  Bilateral TM wnl, oropharynx normal.  PEERL,EOMI.   Voice is hoarse Ears and Nose: No external deformity. CV: RRR, No M/G/R. No JVD. No thrill. No extra heart sounds. PULM: CTA B, no wheezes, crackles, rhonchi. No retractions. No resp. distress. No accessory muscle use. ABD: S, NT, ND, +BS. No rebound. No HSM. EXTR: No c/c/e NEURO Normal gait.  PSYCH: Normally interactive. Conversant. Not depressed or anxious appearing.  Calm demeanor.    Assessment and Plan: Elevated PSA - Plan: PSA, Medicare  Factor V Leiden mutation ([place])  Screening for hyperlipidemia - Plan: Lipid panel  Dysphagia, unspecified type - Plan: Ambulatory referral to ENT  Following up today MWE done by [facility] Flu shot given  He will get shingles shot at drug store Hoarse voice/ weight loss Referral to ENT for eval of  his voice change He will try and gain weight to see if he is able Will  see me in 2 months to check on his progress   Signed Lamar Blinks, MD  Received his labs  Results for orders placed or performed in visit on 04/11/18  PSA, Medicare  Result Value Ref Range   PSA 2.09 0.10 - 4.00 ng/ml  Lipid panel  Result Value Ref Range   Cholesterol 154 0 - 200 mg/dL   Triglycerides 56.0 0.0 - 149.0 mg/dL   HDL 50.30 >39.00 mg/dL   VLDL 11.2 0.0 - 40.0 mg/dL   LDL Cholesterol 92 0 - 99 mg/dL   Total CHOL/HDL Ratio 3    NonHDL 103.55    Letter to pt

## 2018-04-10 ENCOUNTER — Ambulatory Visit: Payer: PPO | Admitting: Family Medicine

## 2018-04-10 ENCOUNTER — Ambulatory Visit: Payer: PPO | Admitting: *Deleted

## 2018-04-10 DIAGNOSIS — H2513 Age-related nuclear cataract, bilateral: Secondary | ICD-10-CM | POA: Diagnosis not present

## 2018-04-11 ENCOUNTER — Ambulatory Visit (INDEPENDENT_AMBULATORY_CARE_PROVIDER_SITE_OTHER): Payer: PPO | Admitting: Family Medicine

## 2018-04-11 ENCOUNTER — Encounter: Payer: Self-pay | Admitting: *Deleted

## 2018-04-11 ENCOUNTER — Ambulatory Visit (INDEPENDENT_AMBULATORY_CARE_PROVIDER_SITE_OTHER): Payer: PPO | Admitting: *Deleted

## 2018-04-11 ENCOUNTER — Encounter: Payer: Self-pay | Admitting: Family Medicine

## 2018-04-11 VITALS — BP 134/82 | HR 65 | Ht 68.0 in | Wt 165.4 lb

## 2018-04-11 VITALS — BP 134/82 | HR 65 | Resp 16 | Ht 68.0 in | Wt 165.0 lb

## 2018-04-11 DIAGNOSIS — D6851 Activated protein C resistance: Secondary | ICD-10-CM

## 2018-04-11 DIAGNOSIS — R1310 Dysphagia, unspecified: Secondary | ICD-10-CM

## 2018-04-11 DIAGNOSIS — Z23 Encounter for immunization: Secondary | ICD-10-CM

## 2018-04-11 DIAGNOSIS — R972 Elevated prostate specific antigen [PSA]: Secondary | ICD-10-CM

## 2018-04-11 DIAGNOSIS — Z0000 Encounter for general adult medical examination without abnormal findings: Secondary | ICD-10-CM

## 2018-04-11 DIAGNOSIS — Z13220 Encounter for screening for lipoid disorders: Secondary | ICD-10-CM | POA: Diagnosis not present

## 2018-04-11 LAB — LIPID PANEL
CHOL/HDL RATIO: 3
Cholesterol: 154 mg/dL (ref 0–200)
HDL: 50.3 mg/dL (ref >39.00)
LDL CALC: 92 mg/dL (ref 0–99)
NONHDL: 103.55
TRIGLYCERIDES: 56 mg/dL (ref 0.0–149.0)
VLDL: 11.2 mg/dL (ref 0.0–40.0)

## 2018-04-11 LAB — PSA, MEDICARE: PSA: 2.09 ng/ml (ref 0.10–4.00)

## 2018-04-11 NOTE — Patient Instructions (Addendum)
You may want to have the shingrix vaccine series at your drug store- this gives superior protection to the old shingles vaccine that you had  We will check your PSA and cholesterol today I am going to send you to ENT for your hoarse voice Please see if you are able to gain a bit of weight Let's visit in 2 months and check on your weight

## 2018-04-11 NOTE — Progress Notes (Signed)
I have reviewed the [place] by Ms. Vevelyn Royals and agree with her documentation  Denny Peon MD

## 2018-04-11 NOTE — Patient Instructions (Signed)
Please schedule your next medicare wellness visit with me in 1 yr.  Continue to eat heart healthy diet (full of fruits, vegetables, whole grains, lean protein, water--limit salt, fat, and sugar intake) and increase physical activity as tolerated.  Bring a copy of your living will and/or healthcare power of attorney to your next office visit.   Randall Scott , Thank you for taking time to come for your Medicare Wellness Visit. I appreciate your ongoing commitment to your health goals. Please review the following plan we discussed and let me know if I can assist you in the future.   These are the goals we discussed: Goals    . maintain current healthy lifestyle (pt-stated)       This is a list of the screening recommended for you and due dates:  Health Maintenance  Topic Date Due  . Tetanus Vaccine  12/27/2022  . Colon Cancer Screening  03/06/2023  . Flu Shot  Completed  .  Hepatitis C: One time screening is recommended by Center for Disease Control  (CDC) for  adults born from [birth year] through 1965.   Completed  . Pneumonia vaccines  Completed

## 2018-04-12 DIAGNOSIS — Z6825 Body mass index (BMI) 25.0-25.9, adult: Secondary | ICD-10-CM | POA: Diagnosis not present

## 2018-04-12 DIAGNOSIS — M7501 Adhesive capsulitis of right shoulder: Secondary | ICD-10-CM | POA: Diagnosis not present

## 2018-04-12 DIAGNOSIS — M4690 Unspecified inflammatory spondylopathy, site unspecified: Secondary | ICD-10-CM | POA: Diagnosis not present

## 2018-04-12 DIAGNOSIS — Z1589 Genetic susceptibility to other disease: Secondary | ICD-10-CM | POA: Diagnosis not present

## 2018-04-12 DIAGNOSIS — Z79899 Other long term (current) drug therapy: Secondary | ICD-10-CM | POA: Diagnosis not present

## 2018-04-12 DIAGNOSIS — L4050 Arthropathic psoriasis, unspecified: Secondary | ICD-10-CM | POA: Diagnosis not present

## 2018-04-12 DIAGNOSIS — M545 Low back pain: Secondary | ICD-10-CM | POA: Diagnosis not present

## 2018-04-12 DIAGNOSIS — E663 Overweight: Secondary | ICD-10-CM | POA: Diagnosis not present

## 2018-04-12 DIAGNOSIS — M2550 Pain in unspecified joint: Secondary | ICD-10-CM | POA: Diagnosis not present

## 2018-04-12 DIAGNOSIS — L409 Psoriasis, unspecified: Secondary | ICD-10-CM | POA: Diagnosis not present

## 2018-04-15 IMAGING — US US EXTREM LOW VENOUS*R*
1 series · 14 of 24 positions shown · non-contrast
Comparison: None.

CLINICAL DATA: Saphenous femoral junction thrombosis

EXAM:
LEFT LOWER EXTREMITY VENOUS DUPLEX ULTRASOUND
TECHNIQUE: Doppler venous assessment of the left lower extremity deep venous
system was performed, including characterization of spectral flow,
compressibility, and phasicity.

[Series 1: us extrem low venous*right* · 0.08mm/px · 14 of 42 slices shown]
[im 1/42]
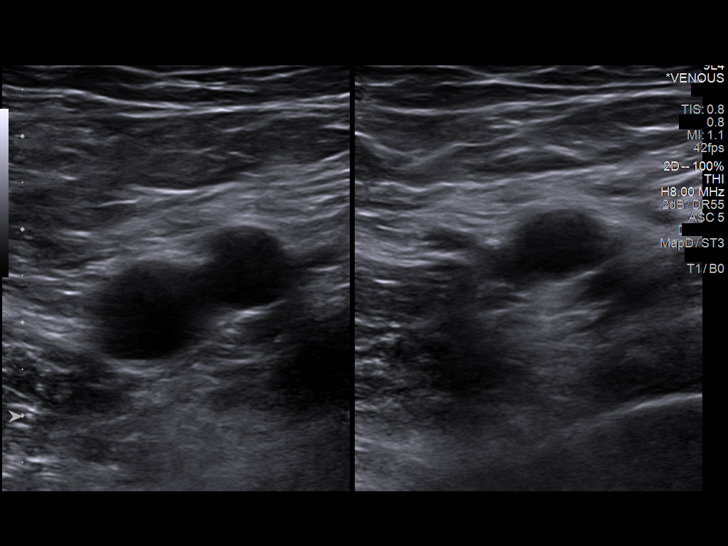
[im 4/42]
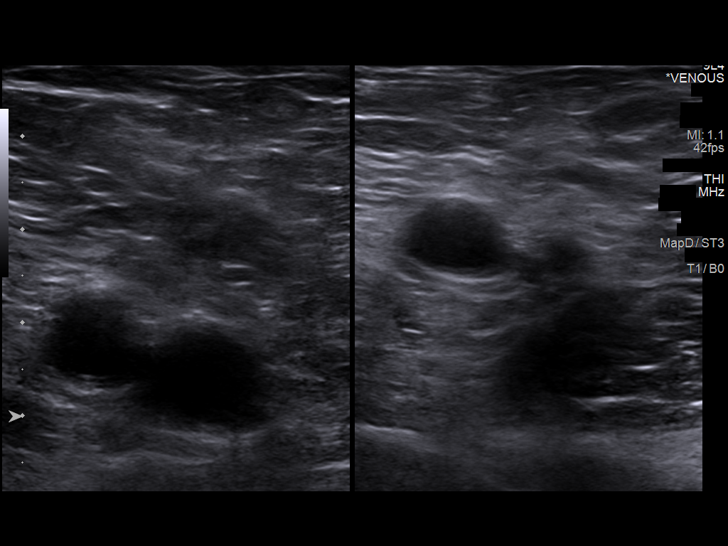
[im 8/42]
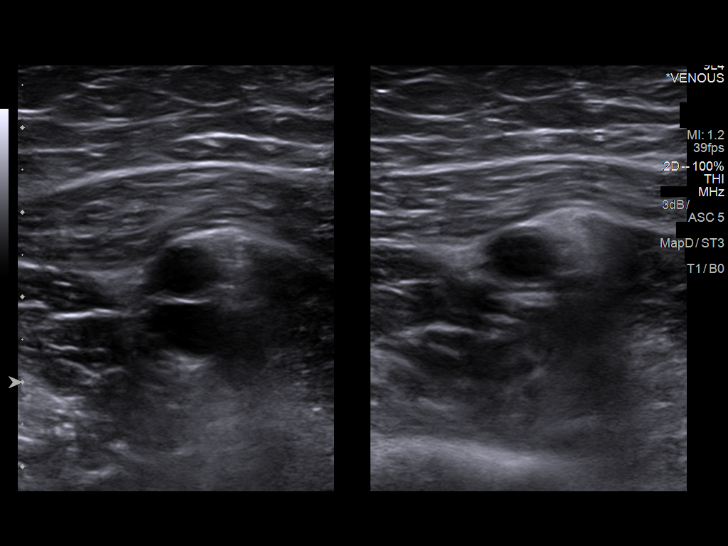
[im 11/42]
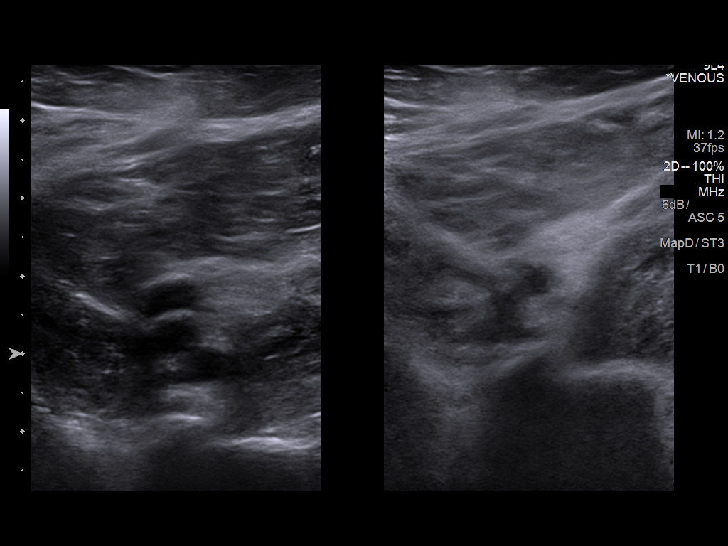
[im 13/42]
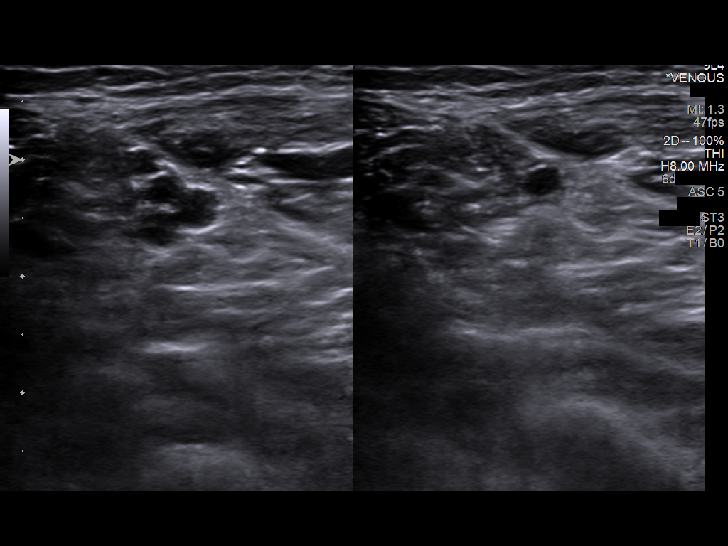
[im 17/42]
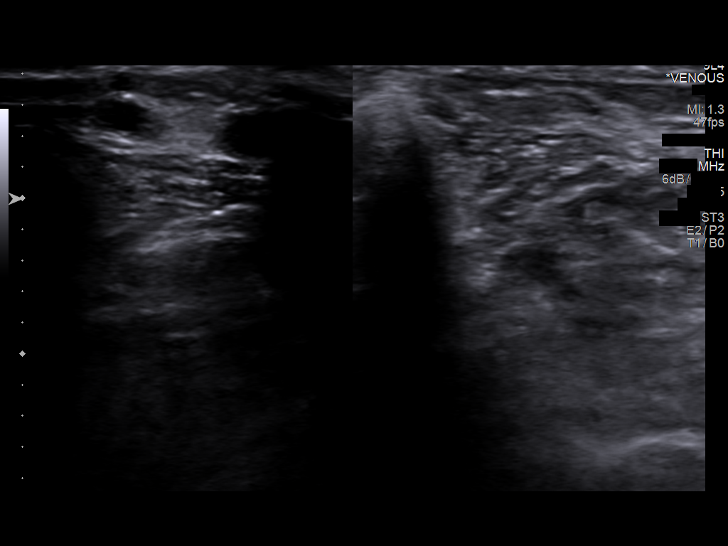
[im 20/42]
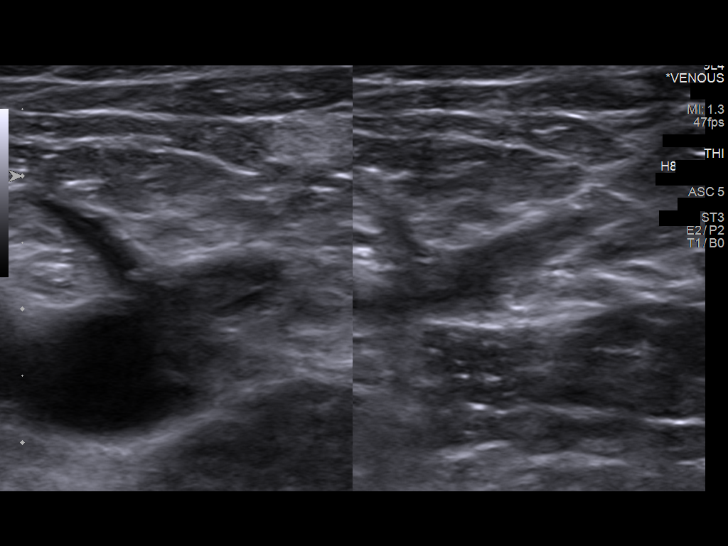
[im 22/42]
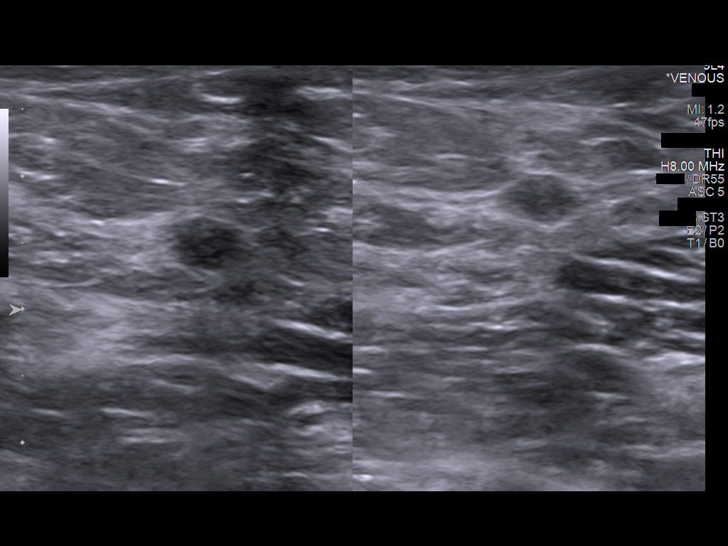
[im 25/42]
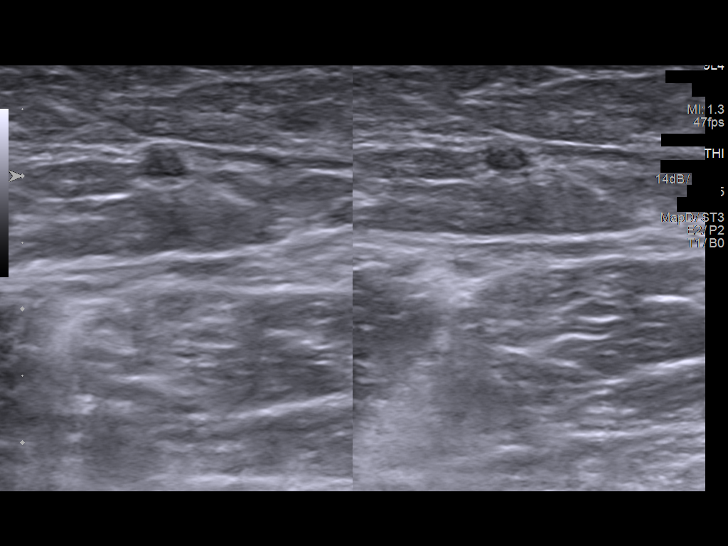
[im 29/42]
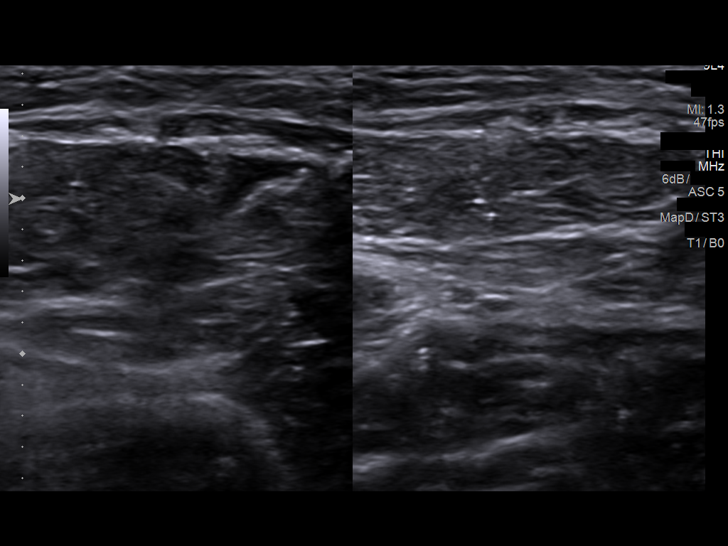
[im 33/42]
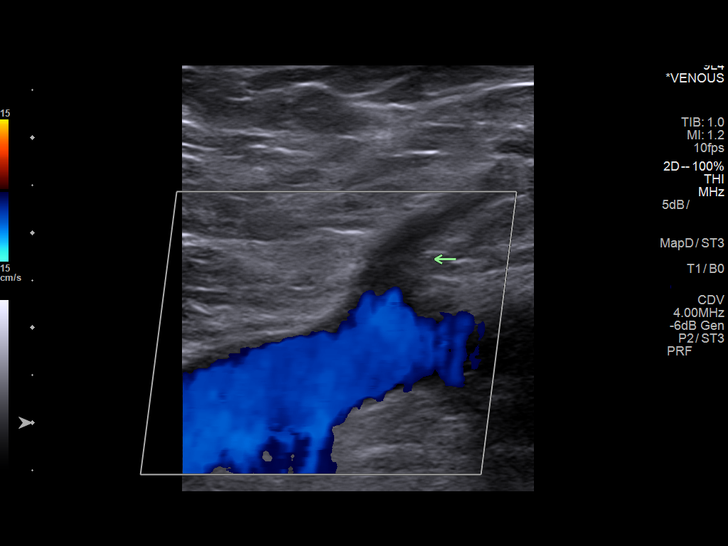
[im 34/42]
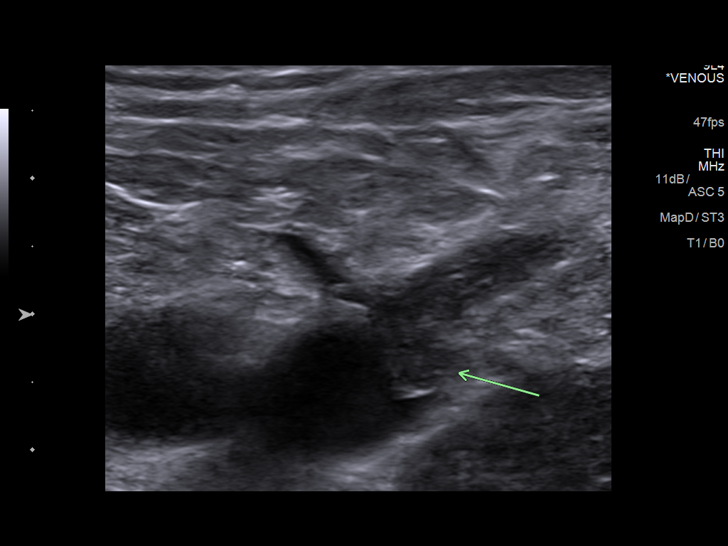
[im 38/42]
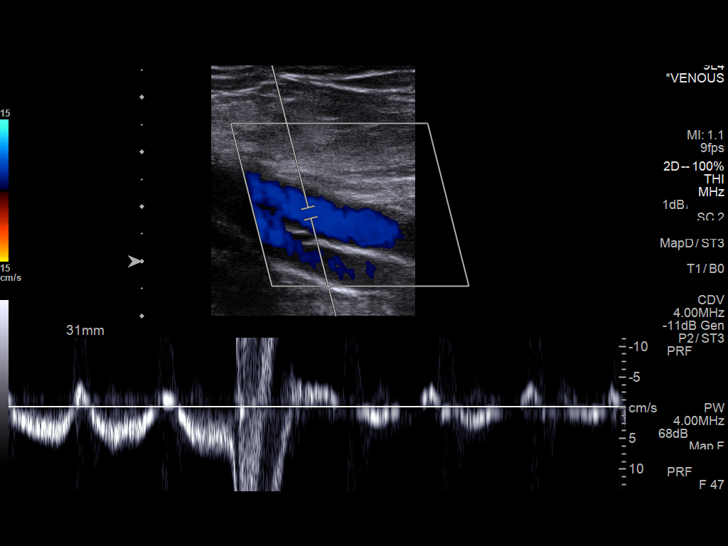
[im 42/42]
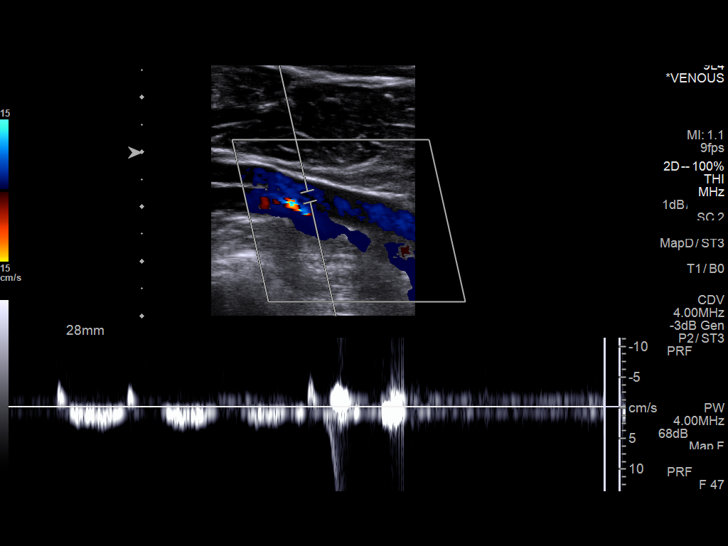

[14 of 24 positions shown; findings below may reference images not displayed]

FINDINGS: There is a thrombosis of the greater saphenous vein. Thrombus
slightly protrudes into the lumen of the common femoral vein. The
appearance is similar to that of the prior study. The femoral and
popliteal veins are compressible out evidence of thrombosis. Doppler
analysis demonstrates respiratory phasicity and augmentation.
IMPRESSION: Examination is stable. Superficial vein thrombosis of the greater
saphenous vein is noted and thrombus does slightly protrude into the
common femoral vein. Thrombus has not propagated and remains
partially occlusive in the common femoral vein. There is no evidence
of DVT in the femoral or popliteal veins.

## 2018-04-25 DIAGNOSIS — R1310 Dysphagia, unspecified: Secondary | ICD-10-CM | POA: Diagnosis not present

## 2018-06-09 NOTE — Progress Notes (Deleted)
[place] at [facility] [address], [place], [place] [address] [phone]  Date:  06/12/2018   Name:  Randall Scott   DOB:  Feb 18, 1951   MRN:  [id number]  PCP:  Darreld Mclean, MD    Chief Complaint: No chief complaint on file.   History of Present Illness:  Randall Scott is a 67 y.o. very pleasant male patient who presents with the following:  followup visit today History of factor 5 leiden with DVT, on xarelto. 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 He has to be careful with chewing and swallowing some foods or food may get stuck  Upper GI was done prior to 2014 but he is not quite sure of when  He has noted intermittent hoarse voice for years.  He notes that he will lose his voice easily if he talks too much ////////////////////////////////// Hoarse voice/ weight loss Referral to ENT for eval of his voice change He will try and gain weight to see if he is able Will see me in 2 months to check on his progress   NTZ:GYFV  Patient Active Problem List   Diagnosis Date Noted  . Factor V Leiden mutation ([place]) 04/07/2017  . Deep vein thrombosis (DVT) of proximal vein of right lower extremity ([place]) 03/01/2017  . Elevated PSA 06/28/2016  . 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 Headache(784.0) 08/21/2013  . Lateral epicondylitis of right elbow 04/01/2013    Past Medical History:  Diagnosis Date  . Allergy   . Arthritis   . Asthma   . Cataract     Past Surgical History:  Procedure Laterality Date  . EYE SURGERY    . HERNIA REPAIR    .  PERIPHERAL VASCULAR THROMBECTOMY Right    RLE    Social History   Tobacco Use  . Smoking status: Never Smoker  . Smokeless tobacco: Never Used  Substance Use Topics  . Alcohol use: No  . Drug use: No    Family History  Problem Relation Age of Onset  . Lung cancer Mother   . Lung cancer Father   . Sudden death Neg Hx   . Hypertension Neg Hx   . Hyperlipidemia Neg Hx   . Heart attack Neg Hx   . Diabetes Neg Hx     No Known Allergies  Medication list has been reviewed and updated.  Current Outpatient Medications on File Prior to Visit  Medication Sig Dispense Refill  . folic acid (FOLVITE) 1 MG tablet Take 1 mg by mouth daily.    . methotrexate 2.5 MG tablet Take by mouth once a week.    . naproxen (NAPROSYN) 500 MG tablet Take 500 mg by mouth 2 (two) times daily as needed.    . rivaroxaban (XARELTO) 10 MG TABS tablet Take 1 tablet (10 mg total) by mouth daily with supper. 30 tablet 11  . vitamin A 10000 UNIT capsule Take 10,000 Units by mouth daily.     No current facility-administered medications on file prior  to visit.     Review of Systems:  As per HPI- otherwise negative.   Physical Examination: There were no vitals filed for this visit. There were no vitals filed for this visit. There is no height or weight on file to calculate BMI. Ideal Body Weight:    GEN: WDWN, NAD, Non-toxic, A & O x 3 HEENT: Atraumatic, Normocephalic. Neck supple. No masses, No LAD. Ears and Nose: No external deformity. CV: RRR, No M/G/R. No JVD. No thrill. No extra heart sounds. PULM: CTA B, no wheezes, crackles, rhonchi. No retractions. No resp. distress. No accessory muscle use. ABD: S, NT, ND, +BS. No rebound. No HSM. EXTR: No c/c/e NEURO Normal gait.  PSYCH: Normally interactive. Conversant. Not depressed or anxious appearing.  Calm demeanor.    Assessment and Plan: ***  Signed Lamar Blinks, MD

## 2018-06-12 ENCOUNTER — Ambulatory Visit: Payer: PPO | Admitting: Family Medicine

## 2018-06-13 ENCOUNTER — Telehealth: Payer: Self-pay | Admitting: *Deleted

## 2018-06-13 NOTE — Telephone Encounter (Signed)
Received voice mail message from patient stating,"I am going to run out of Xarelto and I'm in the doughnut hole. Does this office have any samples to give me til the end of the year? Or any patient assistance? Please call me back either way to let me know. Return number is [phone]. Thank you."

## 2018-06-14 ENCOUNTER — Encounter: Payer: Self-pay | Admitting: Pharmacist

## 2018-06-14 NOTE — Progress Notes (Signed)
Samples given:  Xarelto 10 mg     2 bottles x 7 tablets each Lot:  [id number]        Exp:  1/21

## 2018-07-10 DIAGNOSIS — R69 Illness, unspecified: Secondary | ICD-10-CM | POA: Diagnosis not present

## 2018-07-12 DIAGNOSIS — Z6826 Body mass index (BMI) 26.0-26.9, adult: Secondary | ICD-10-CM | POA: Diagnosis not present

## 2018-07-12 DIAGNOSIS — M2550 Pain in unspecified joint: Secondary | ICD-10-CM | POA: Diagnosis not present

## 2018-07-12 DIAGNOSIS — E663 Overweight: Secondary | ICD-10-CM | POA: Diagnosis not present

## 2018-07-12 DIAGNOSIS — L4050 Arthropathic psoriasis, unspecified: Secondary | ICD-10-CM | POA: Diagnosis not present

## 2018-07-12 DIAGNOSIS — M545 Low back pain: Secondary | ICD-10-CM | POA: Diagnosis not present

## 2018-07-12 DIAGNOSIS — Z79899 Other long term (current) drug therapy: Secondary | ICD-10-CM | POA: Diagnosis not present

## 2018-07-12 DIAGNOSIS — Z1589 Genetic susceptibility to other disease: Secondary | ICD-10-CM | POA: Diagnosis not present

## 2018-07-12 DIAGNOSIS — L409 Psoriasis, unspecified: Secondary | ICD-10-CM | POA: Diagnosis not present

## 2018-07-12 DIAGNOSIS — M542 Cervicalgia: Secondary | ICD-10-CM | POA: Diagnosis not present

## 2018-08-19 ENCOUNTER — Telehealth: Payer: Self-pay | Admitting: Hematology & Oncology

## 2018-08-19 ENCOUNTER — Inpatient Hospital Stay: Payer: Medicare HMO | Attending: Hematology & Oncology | Admitting: Hematology & Oncology

## 2018-08-19 ENCOUNTER — Other Ambulatory Visit: Payer: Self-pay

## 2018-08-19 ENCOUNTER — Inpatient Hospital Stay: Payer: Medicare HMO

## 2018-08-19 ENCOUNTER — Encounter: Payer: Self-pay | Admitting: Hematology & Oncology

## 2018-08-19 VITALS — BP 127/81 | HR 63 | Temp 98.0°F | Resp 18 | Wt 168.0 lb

## 2018-08-19 DIAGNOSIS — Z79899 Other long term (current) drug therapy: Secondary | ICD-10-CM | POA: Diagnosis not present

## 2018-08-19 DIAGNOSIS — I824Y1 Acute embolism and thrombosis of unspecified deep veins of right proximal lower extremity: Secondary | ICD-10-CM | POA: Insufficient documentation

## 2018-08-19 DIAGNOSIS — D6851 Activated protein C resistance: Secondary | ICD-10-CM | POA: Diagnosis not present

## 2018-08-19 DIAGNOSIS — Z7901 Long term (current) use of anticoagulants: Secondary | ICD-10-CM | POA: Insufficient documentation

## 2018-08-19 LAB — CBC WITH DIFFERENTIAL (CANCER CENTER ONLY)
Abs Immature Granulocytes: 0.03 10*3/uL (ref 0.00–0.07)
Basophils Absolute: 0.1 10*3/uL (ref 0.0–0.1)
Basophils Relative: 1 %
EOS ABS: 0.3 10*3/uL (ref 0.0–0.5)
EOS PCT: 3 %
HCT: 44.9 % (ref 39.0–52.0)
Hemoglobin: 15 g/dL (ref 13.0–17.0)
Immature Granulocytes: 0 %
Lymphocytes Relative: 17 %
Lymphs Abs: 1.3 10*3/uL (ref 0.7–4.0)
MCH: 34.3 pg — AB (ref 26.0–34.0)
MCHC: 33.4 g/dL (ref 30.0–36.0)
MCV: 102.7 fL — ABNORMAL HIGH (ref 80.0–100.0)
MONO ABS: 0.7 10*3/uL (ref 0.1–1.0)
Monocytes Relative: 9 %
NRBC: 0 % (ref 0.0–0.2)
Neutro Abs: 5.3 10*3/uL (ref 1.7–7.7)
Neutrophils Relative %: 70 %
PLATELETS: 196 10*3/uL (ref 150–400)
RBC: 4.37 MIL/uL (ref 4.22–5.81)
RDW: 13.2 % (ref 11.5–15.5)
WBC: 7.6 10*3/uL (ref 4.0–10.5)

## 2018-08-19 LAB — CMP (CANCER CENTER ONLY)
ALK PHOS: 59 U/L (ref 38–126)
ALT: 13 U/L (ref 0–44)
AST: 16 U/L (ref 15–41)
Albumin: 4.4 g/dL (ref 3.5–5.0)
Anion gap: 7 (ref 5–15)
BILIRUBIN TOTAL: 0.5 mg/dL (ref 0.3–1.2)
BUN: 16 mg/dL (ref 8–23)
CALCIUM: 9.5 mg/dL (ref 8.9–10.3)
CO2: 28 mmol/L (ref 22–32)
CREATININE: 1.24 mg/dL (ref 0.61–1.24)
Chloride: 106 mmol/L (ref 98–111)
GFR, EST NON AFRICAN AMERICAN: 60 mL/min — AB (ref >60)
Glucose, Bld: 101 mg/dL — ABNORMAL HIGH (ref 70–99)
Potassium: 5 mmol/L (ref 3.5–5.1)
Sodium: 141 mmol/L (ref 135–145)
TOTAL PROTEIN: 6.3 g/dL — AB (ref 6.5–8.1)

## 2018-08-19 NOTE — Telephone Encounter (Signed)
Appointments scheduled avs/calendar printed per 2/17 los °

## 2018-08-19 NOTE — Progress Notes (Signed)
Hematology and Oncology Follow Up Visit  Randall Scott [id number] 25-Dec-1950 68 y.o. 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 daily-maintenance therapy for 1 year-complete in August 2020      Interim History:  Randall Scott is back for follow-up. We did find that he has the factor V Leiden mutation. He is heterozygous for this.  We saw him 6 months ago.  He has been doing quite well.  He has had no problems since we last saw him.  In actuality, when we last saw him, he went on a whirlwind trip through the mid [place] and out to [place] to wash the [employer].  He then went down to [place] to see [employer] and then down to [place] to [employer].  He had no problems at all with his legs.  He drank a lot of water.  He got a walked around quite a bit.  He has had no bleeding.  He has had no nausea or vomiting.  There is been no change in bowel or bladder habits.  He has had no cough or chest wall pain.  Overall, his performance status is ECOG 0.    Medications:  Current Outpatient Medications:  .  amoxicillin (AMOXIL) 500 MG capsule, Take 500 mg by mouth 4 (four) times daily. Before dental procedures., Disp: , Rfl:  .  folic acid (FOLVITE) 1 MG tablet, Take 1 mg by mouth daily., Disp: , Rfl:  .  methotrexate 2.5 MG tablet, Take by mouth once a week., Disp: , Rfl:  .  naproxen (NAPROSYN) 500 MG tablet, Take 500 mg by mouth 2 (two) times daily as needed., Disp: , Rfl:  .  omeprazole (PRILOSEC) 40 MG capsule, Take 40 mg by mouth daily., Disp: , Rfl:  .  rivaroxaban (XARELTO) 10 MG TABS tablet, Take 1 tablet (10 mg total) by mouth daily with supper., Disp: 30 tablet, Rfl: 11 .  vitamin A 10000 UNIT capsule, Take 10,000 Units by mouth daily., Disp: , Rfl:   Allergies: No Known  Allergies  Past Medical History, Surgical history, Social history, and Family History were reviewed and updated.  Review of Systems: Review of Systems  Constitutional: Negative for appetite change, fatigue, fever and unexpected weight change.  HENT:   Negative for lump/mass, mouth sores, sore throat and trouble swallowing.   Respiratory: Negative for cough, hemoptysis and shortness of breath.   Cardiovascular: Negative for leg swelling and palpitations.  Gastrointestinal: Negative for abdominal distention, abdominal pain, blood in stool, constipation, diarrhea, nausea and vomiting.  Genitourinary: Negative for bladder incontinence, dysuria, frequency and hematuria.   Musculoskeletal: Negative for arthralgias, back pain, gait problem and myalgias.  Skin: Negative for itching and rash.  Neurological: Negative for dizziness, extremity weakness, gait problem, headaches, numbness, seizures and speech difficulty.  Hematological: Does not bruise/bleed easily.  Psychiatric/Behavioral: Negative for depression and sleep disturbance. The patient is not nervous/anxious.     Physical Exam:  weight is 168 lb (76.2 kg). His oral temperature is 98 F (36.7 C). His blood pressure is 127/81 and his pulse is 63. His respiration is 18 and oxygen saturation is 98%.   Wt Readings from Last 3 Encounters:  08/19/18 168 lb (76.2 kg)  04/11/18 165 lb (74.8 kg)  04/11/18 165 lb 6.4 oz (  75 kg)    Physical Exam Vitals signs reviewed.  HENT:     Head: Normocephalic and atraumatic.  Eyes:     Pupils: Pupils are equal, round, and reactive to light.  Neck:     Musculoskeletal: Normal range of motion.  Cardiovascular:     Rate and Rhythm: Normal rate and regular rhythm.     Heart sounds: Normal heart sounds.  Pulmonary:     Effort: Pulmonary effort is normal.     Breath sounds: Normal breath sounds.  Abdominal:     General: Bowel sounds are normal.     Palpations: Abdomen is soft.  Musculoskeletal:  Normal range of motion.        General: No tenderness or deformity.  Lymphadenopathy:     Cervical: No cervical adenopathy.  Skin:    General: Skin is warm and dry.     Findings: No erythema or rash.  Neurological:     Mental Status: He is alert and oriented to person, place, and time.  Psychiatric:        Behavior: Behavior normal.        Thought Content: Thought content normal.        Judgment: Judgment normal.      Lab Results  Component Value Date   WBC 7.6 08/19/2018   HGB 15.0 08/19/2018   HCT 44.9 08/19/2018   MCV 102.7 (H) 08/19/2018   PLT 196 08/19/2018     Chemistry      Component Value Date/Time   NA 141 08/19/2018 0857   NA 147 (H) 04/13/2017 0936   K 5.0 08/19/2018 0857   K 4.4 04/13/2017 0936   CL 106 08/19/2018 0857   CL 108 04/13/2017 0936   CO2 28 08/19/2018 0857   CO2 33 04/13/2017 0936   BUN 16 08/19/2018 0857   BUN 13 04/13/2017 0936   CREATININE 1.24 08/19/2018 0857   CREATININE 1.5 (H) 04/13/2017 0936   GLU 88 02/01/2016      Component Value Date/Time   CALCIUM 9.5 08/19/2018 0857   CALCIUM 10.0 04/13/2017 0936   ALKPHOS 59 08/19/2018 0857   ALKPHOS 51 04/13/2017 0936   AST 16 08/19/2018 0857   ALT 13 08/19/2018 0857   ALT 34 04/13/2017 0936   BILITOT 0.5 08/19/2018 0857         Impression and Plan: Randall Scott is a 68 year old male. He has a Factor 5 Leiden mutation. He is heterozygous for this.  I will do a Doppler of his right leg we see him back.  We will see him back in August.  By then, he will be done with his year of maintenance Xarelto.  I do still think we have to keep him on Xarelto unless he has another thromboembolic event.  I tried to insist that he drink a lot of water.  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  I told him that if he takes ibuprofen.  He MUST take the ibuprofen with food and try to take it in the morning when he is up and moving about.  He understands this.  Volanda Napoleon, MD 2/17/20209:40 AM

## 2018-11-28 DIAGNOSIS — L4050 Arthropathic psoriasis, unspecified: Secondary | ICD-10-CM | POA: Diagnosis not present

## 2018-11-28 DIAGNOSIS — Z1589 Genetic susceptibility to other disease: Secondary | ICD-10-CM | POA: Diagnosis not present

## 2018-11-28 DIAGNOSIS — Z79899 Other long term (current) drug therapy: Secondary | ICD-10-CM | POA: Diagnosis not present

## 2018-11-28 DIAGNOSIS — M542 Cervicalgia: Secondary | ICD-10-CM | POA: Diagnosis not present

## 2018-11-28 DIAGNOSIS — L409 Psoriasis, unspecified: Secondary | ICD-10-CM | POA: Diagnosis not present

## 2018-11-28 DIAGNOSIS — M2550 Pain in unspecified joint: Secondary | ICD-10-CM | POA: Diagnosis not present

## 2018-11-28 DIAGNOSIS — M545 Low back pain: Secondary | ICD-10-CM | POA: Diagnosis not present

## 2018-12-04 DIAGNOSIS — L4050 Arthropathic psoriasis, unspecified: Secondary | ICD-10-CM | POA: Diagnosis not present

## 2019-02-17 ENCOUNTER — Other Ambulatory Visit: Payer: Medicare HMO

## 2019-02-17 ENCOUNTER — Telehealth: Payer: Self-pay | Admitting: Hematology & Oncology

## 2019-02-17 ENCOUNTER — Ambulatory Visit (HOSPITAL_BASED_OUTPATIENT_CLINIC_OR_DEPARTMENT_OTHER)
Admission: RE | Admit: 2019-02-17 | Discharge: 2019-02-17 | Disposition: A | Discharge status: Home / Self Care | Payer: Medicare HMO | Source: Ambulatory Visit | Attending: Hematology & Oncology | Admitting: Hematology & Oncology

## 2019-02-17 ENCOUNTER — Inpatient Hospital Stay: Payer: Medicare HMO | Attending: Hematology & Oncology

## 2019-02-17 ENCOUNTER — Ambulatory Visit: Payer: Medicare HMO | Admitting: Hematology & Oncology

## 2019-02-17 ENCOUNTER — Inpatient Hospital Stay (HOSPITAL_BASED_OUTPATIENT_CLINIC_OR_DEPARTMENT_OTHER): Payer: Medicare HMO | Admitting: Hematology & Oncology

## 2019-02-17 ENCOUNTER — Other Ambulatory Visit: Payer: Self-pay

## 2019-02-17 ENCOUNTER — Encounter: Payer: Self-pay | Admitting: Hematology & Oncology

## 2019-02-17 VITALS — BP 119/70 | HR 68 | Temp 97.5°F | Resp 18 | Wt 170.0 lb

## 2019-02-17 DIAGNOSIS — Z79899 Other long term (current) drug therapy: Secondary | ICD-10-CM | POA: Diagnosis not present

## 2019-02-17 DIAGNOSIS — D6851 Activated protein C resistance: Secondary | ICD-10-CM | POA: Insufficient documentation

## 2019-02-17 DIAGNOSIS — I824Y1 Acute embolism and thrombosis of unspecified deep veins of right proximal lower extremity: Secondary | ICD-10-CM

## 2019-02-17 DIAGNOSIS — I825Y1 Chronic embolism and thrombosis of unspecified deep veins of right proximal lower extremity: Secondary | ICD-10-CM | POA: Diagnosis not present

## 2019-02-17 DIAGNOSIS — Z7982 Long term (current) use of aspirin: Secondary | ICD-10-CM | POA: Diagnosis not present

## 2019-02-17 DIAGNOSIS — I8001 Phlebitis and thrombophlebitis of superficial vessels of right lower extremity: Secondary | ICD-10-CM | POA: Diagnosis not present

## 2019-02-17 DIAGNOSIS — Z7901 Long term (current) use of anticoagulants: Secondary | ICD-10-CM | POA: Insufficient documentation

## 2019-02-17 LAB — CMP (CANCER CENTER ONLY)
ALT: 13 U/L (ref 0–44)
AST: 16 U/L (ref 15–41)
Albumin: 4 g/dL (ref 3.5–5.0)
Alkaline Phosphatase: 53 U/L (ref 38–126)
Anion gap: 5 (ref 5–15)
BUN: 13 mg/dL (ref 8–23)
CO2: 28 mmol/L (ref 22–32)
Calcium: 9 mg/dL (ref 8.9–10.3)
Chloride: 107 mmol/L (ref 98–111)
Creatinine: 1.09 mg/dL (ref 0.61–1.24)
GFR, Est AFR Am: >60 mL/min (ref >60)
GFR, Estimated: >60 mL/min (ref >60)
Glucose, Bld: 87 mg/dL (ref 70–99)
Potassium: 4.4 mmol/L (ref 3.5–5.1)
Sodium: 140 mmol/L (ref 135–145)
Total Bilirubin: 0.5 mg/dL (ref 0.3–1.2)
Total Protein: 5.9 g/dL — ABNORMAL LOW (ref 6.5–8.1)

## 2019-02-17 LAB — CBC WITH DIFFERENTIAL (CANCER CENTER ONLY)
Abs Immature Granulocytes: 0.01 10*3/uL (ref 0.00–0.07)
Basophils Absolute: 0 10*3/uL (ref 0.0–0.1)
Basophils Relative: 0 %
Eosinophils Absolute: 0.2 10*3/uL (ref 0.0–0.5)
Eosinophils Relative: 3 %
HCT: 41.5 % (ref 39.0–52.0)
Hemoglobin: 14 g/dL (ref 13.0–17.0)
Immature Granulocytes: 0 %
Lymphocytes Relative: 22 %
Lymphs Abs: 1.3 10*3/uL (ref 0.7–4.0)
MCH: 33.9 pg (ref 26.0–34.0)
MCHC: 33.7 g/dL (ref 30.0–36.0)
MCV: 100.5 fL — ABNORMAL HIGH (ref 80.0–100.0)
Monocytes Absolute: 0.5 10*3/uL (ref 0.1–1.0)
Monocytes Relative: 8 %
Neutro Abs: 3.9 10*3/uL (ref 1.7–7.7)
Neutrophils Relative %: 67 %
Platelet Count: 179 10*3/uL (ref 150–400)
RBC: 4.13 MIL/uL — ABNORMAL LOW (ref 4.22–5.81)
RDW: 13.2 % (ref 11.5–15.5)
WBC Count: 5.9 10*3/uL (ref 4.0–10.5)
nRBC: 0 % (ref 0.0–0.2)

## 2019-02-17 NOTE — Telephone Encounter (Signed)
lmom to inform patient of Feb 2021 appts per 8/17 LOS

## 2019-02-17 NOTE — Progress Notes (Signed)
Hematology and Oncology Follow Up Visit  Randall Scott [id number] 01-Sep-1950 68 y.o. 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 daily-maintenance therapy for 1 year-complete in August 2020  EC ASA 162 mg po q day -- start on 02/17/2019      Interim History:  Randall Scott is back for follow-up.  He is doing quite well.  We did go and get a Doppler of his right leg today.  No deep vein thrombus was noted.  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  He has been on blood thinner now for 2 years.  He will now complete his maintenance Xarelto and we will switch him over to baby aspirin.  We will give him 162 mg of baby aspirin daily.  I told him to take coated aspirin with food in the morning.  He has had no problems with bleeding.  He has had no cough or chest wall pain.  He has had no rashes.  He has had no leg swelling.  Overall, his performance status is ECOG 0.    Medications:  Current Outpatient Medications:  .  folic acid (FOLVITE) 1 MG tablet, Take 1 mg by mouth daily., Disp: , Rfl:  .  methotrexate 2.5 MG tablet, Take by mouth once a week., Disp: , Rfl:  .  omeprazole (PRILOSEC) 40 MG capsule, Take 40 mg by mouth daily., Disp: , Rfl:  .  rivaroxaban (XARELTO) 10 MG TABS tablet, Take 1 tablet (10 mg total) by mouth daily with supper., Disp: 30 tablet, Rfl: 11 .  vitamin A 10000 UNIT capsule, Take 10,000 Units by mouth daily., Disp: , Rfl:   Allergies: No Known Allergies  Past Medical History, Surgical history, Social history, and Family History were reviewed and updated.  Review of Systems: Review of Systems  Constitutional: Negative for appetite change, fatigue, fever and unexpected weight change.   HENT:   Negative for lump/mass, mouth sores, sore throat and trouble swallowing.   Respiratory: Negative for cough, hemoptysis and shortness of breath.   Cardiovascular: Negative for leg swelling and palpitations.  Gastrointestinal: Negative for abdominal distention, abdominal pain, blood in stool, constipation, diarrhea, nausea and vomiting.  Genitourinary: Negative for bladder incontinence, dysuria, frequency and hematuria.   Musculoskeletal: Negative for arthralgias, back pain, gait problem and myalgias.  Skin: Negative for itching and rash.  Neurological: Negative for dizziness, extremity weakness, gait problem, headaches, numbness, seizures and speech difficulty.  Hematological: Does not bruise/bleed easily.  Psychiatric/Behavioral: Negative for depression and sleep disturbance. The patient is not nervous/anxious.     Physical Exam:  weight is 170 lb (77.1 kg). His temporal temperature is 97.5 F (36.4 C) (abnormal). His blood pressure is 119/70 and his pulse is 68. His respiration is 18 and oxygen saturation is 99%.   Wt Readings from Last 3 Encounters:  02/17/19 170 lb (77.1 kg)  08/19/18 168 lb (76.2 kg)  04/11/18 165 lb (74.8 kg)    Physical Exam Vitals signs reviewed.  HENT:     Head: Normocephalic and atraumatic.  Eyes:     Pupils: Pupils are equal, round, and reactive to light.  Neck:     Musculoskeletal: Normal range of motion.  Cardiovascular:     Rate  and Rhythm: Normal rate and regular rhythm.     Heart sounds: Normal heart sounds.  Pulmonary:     Effort: Pulmonary effort is normal.     Breath sounds: Normal breath sounds.  Abdominal:     General: Bowel sounds are normal.     Palpations: Abdomen is soft.  Musculoskeletal: Normal range of motion.        General: No tenderness or deformity.  Lymphadenopathy:     Cervical: No cervical adenopathy.  Skin:    General: Skin is warm and dry.     Findings: No erythema or rash.  Neurological:     Mental Status:  He is alert and oriented to person, place, and time.  Psychiatric:        Behavior: Behavior normal.        Thought Content: Thought content normal.        Judgment: Judgment normal.      Lab Results  Component Value Date   WBC 5.9 02/17/2019   HGB 14.0 02/17/2019   HCT 41.5 02/17/2019   MCV 100.5 (H) 02/17/2019   PLT 179 02/17/2019     Chemistry      Component Value Date/Time   NA 140 02/17/2019 0946   NA 147 (H) 04/13/2017 0936   K 4.4 02/17/2019 0946   K 4.4 04/13/2017 0936   CL 107 02/17/2019 0946   CL 108 04/13/2017 0936   CO2 28 02/17/2019 0946   CO2 33 04/13/2017 0936   BUN 13 02/17/2019 0946   BUN 13 04/13/2017 0936   CREATININE 1.09 02/17/2019 0946   CREATININE 1.5 (H) 04/13/2017 0936   GLU 88 02/01/2016      Component Value Date/Time   CALCIUM 9.0 02/17/2019 0946   CALCIUM 10.0 04/13/2017 0936   ALKPHOS 53 02/17/2019 0946   ALKPHOS 51 04/13/2017 0936   AST 16 02/17/2019 0946   ALT 13 02/17/2019 0946   ALT 34 04/13/2017 0936   BILITOT 0.5 02/17/2019 0946         Impression and Plan: Randall Scott is a 68 year old male. He has a Factor V Leiden mutation. He is heterozygous for this.  I we will now stop the Xarelto.  We will have him on baby aspirin.  I think this is very reasonable.  If he has another thromboembolic event, then we will have to keep him on lifelong anticoagulation.  I will plan to see him back in 6 months.  If all looks good in 6 months, then we will let him go from the practice.  Volanda Napoleon, MD 8/17/202011:02 AM

## 2019-03-03 DIAGNOSIS — Z1589 Genetic susceptibility to other disease: Secondary | ICD-10-CM | POA: Diagnosis not present

## 2019-03-03 DIAGNOSIS — M2550 Pain in unspecified joint: Secondary | ICD-10-CM | POA: Diagnosis not present

## 2019-03-03 DIAGNOSIS — M542 Cervicalgia: Secondary | ICD-10-CM | POA: Diagnosis not present

## 2019-03-03 DIAGNOSIS — L409 Psoriasis, unspecified: Secondary | ICD-10-CM | POA: Diagnosis not present

## 2019-03-03 DIAGNOSIS — M545 Low back pain: Secondary | ICD-10-CM | POA: Diagnosis not present

## 2019-03-03 DIAGNOSIS — L4050 Arthropathic psoriasis, unspecified: Secondary | ICD-10-CM | POA: Diagnosis not present

## 2019-03-03 DIAGNOSIS — Z6825 Body mass index (BMI) 25.0-25.9, adult: Secondary | ICD-10-CM | POA: Diagnosis not present

## 2019-03-03 DIAGNOSIS — E663 Overweight: Secondary | ICD-10-CM | POA: Diagnosis not present

## 2019-03-03 DIAGNOSIS — Z79899 Other long term (current) drug therapy: Secondary | ICD-10-CM | POA: Diagnosis not present

## 2019-03-16 DIAGNOSIS — R69 Illness, unspecified: Secondary | ICD-10-CM | POA: Diagnosis not present

## 2019-04-01 DIAGNOSIS — H2513 Age-related nuclear cataract, bilateral: Secondary | ICD-10-CM | POA: Diagnosis not present

## 2019-04-10 NOTE — Progress Notes (Addendum)
Virtual Visit via Video Note  I connected with patient on 04/14/19 at  9:00 AM EDT by audio enabled telemedicine application and verified that I am speaking with the correct person using two identifiers.   THIS ENCOUNTER IS A VIRTUAL VISIT DUE TO COVID-19 - PATIENT WAS NOT SEEN IN THE OFFICE. PATIENT HAS CONSENTED TO VIRTUAL VISIT / TELEMEDICINE VISIT   Location of patient: home  Location of provider: office  I discussed the limitations of evaluation and management by telemedicine and the availability of in person appointments. The patient expressed understanding and agreed to proceed.   Subjective:   Randall Scott is a 68 y.o. male who presents for Medicare Annual/Subsequent preventive examination.  Review of Systems:  Cardiac Risk Factors include: advanced age (>45men, >61 women);male gender   Home Safety/Smoke Alarms: Feels safe in home. Smoke alarms in place.  Lives w/ wife. No stairs.  Male:   CCS- next due 03/2023     PSA-  Lab Results  Component Value Date   PSA 2.09 04/11/2018   PSA 2.15 04/09/2017   PSA 4.44 (H) 04/06/2016       Objective:    Vitals: Unable to assess. This visit is enabled though telemedicine due to Covid 19.   Advanced Directives 04/14/2019 08/19/2018 04/11/2018 02/15/2018 04/13/2017 04/09/2017  Does Patient Have a Medical Advance Directive? Yes Yes Yes Yes Yes Yes  Type of Paramedic of [place];Living will Living will;Healthcare Power of [place];Living will Living will [place];Living will Living will;Healthcare Power of Attorney  Does patient want to make changes to medical advance directive? No - Patient declined - - - - -  Copy of [place] in Chart? No - copy requested - No - copy requested - - No - copy requested    Tobacco Social History   Tobacco Use  Smoking Status Never Smoker  Smokeless Tobacco Never Used     Counseling given: Not Answered    Clinical Intake:     Pain : No/denies pain     Past Medical History:  Diagnosis Date  . Allergy   . Arthritis   . Asthma   . Cataract    Past Surgical History:  Procedure Laterality Date  . EYE SURGERY    . HERNIA REPAIR    . PERIPHERAL VASCULAR THROMBECTOMY Right    RLE   Family History  Problem Relation Age of Onset  . Lung cancer Mother   . Lung cancer Father   . Sudden death Neg Hx   . Hypertension Neg Hx   . Hyperlipidemia Neg Hx   . Heart attack Neg Hx   . Diabetes Neg Hx    Social History   Socioeconomic History  . Marital status: Married    Spouse name: Caren Griffins  . Number of children: 2  . Years of education: 4  . Highest education level: Not on file  Occupational History  . Occupation: Information systems manager: [employer]  Social Needs  . Financial resource strain: Not on file  . Food insecurity    Worry: Not on file    Inability: Not on file  . Transportation needs    Medical: Not on file    Non-medical: Not on file  Tobacco Use  . Smoking status: Never Smoker  . Smokeless tobacco: Never Used  Substance and Sexual Activity  . Alcohol use: No  . Drug use: No  . Sexual activity:  Yes    Birth control/protection: None    Comment: married  Lifestyle  . Physical activity    Days per week: Not on file    Minutes per session: Not on file  . Stress: Not on file  Relationships  . Social Herbalist on phone: Not on file    Gets together: Not on file    Attends religious service: Not on file    Active member of club or organization: Not on file    Attends meetings of clubs or organizations: Not on file    Relationship status: Not on file  Other Topics Concern  . Not on file  Social History Narrative   Lives with his wife.  Daughters are grown and live nearby.   Patient is right handed   Education level is 12   Caffeine consumption is 2-3 cups daily    Outpatient Encounter Medications as of 04/14/2019   Medication Sig  . aspirin 81 MG chewable tablet Chew 162 mg by mouth daily.  . folic acid (FOLVITE) 1 MG tablet Take 1 mg by mouth daily.  . methotrexate 2.5 MG tablet Take by mouth once a week.  Marland Kitchen omeprazole (PRILOSEC) 40 MG capsule Take 40 mg by mouth daily.  . vitamin A 10000 UNIT capsule Take 10,000 Units by mouth daily.  . [DISCONTINUED] rivaroxaban (XARELTO) 10 MG TABS tablet Take 1 tablet (10 mg total) by mouth daily with supper.   No facility-administered encounter medications on file as of 04/14/2019.     Activities of Daily Living In your present state of health, do you have any difficulty performing the following activities: 04/14/2019  Hearing? N  Vision? N  Difficulty concentrating or making decisions? N  Walking or climbing stairs? N  Dressing or bathing? N  Doing errands, shopping? N  Preparing Food and eating ? N  Using the Toilet? N  In the past six months, have you accidently leaked urine? N  Do you have problems with loss of bowel control? N  Managing your Medications? N  Managing your Finances? N  Housekeeping or managing your Housekeeping? N  Some recent data might be hidden    Patient Care Team: Copland, Gay Filler, MD as PCP - General (Family Medicine) Maylon Peppers, MD (Family Medicine) Marin Olp Rudell Cobb, MD as Consulting Physician (Oncology)   Assessment:   This is a routine wellness examination for Randall Scott. Physical assessment deferred to PCP.  Exercise Activities and Dietary recommendations Current Exercise Habits: Home exercise routine, Type of exercise: walking, Time (Minutes): 45, Frequency (Times/Week): 5, Weekly Exercise (Minutes/Week): 225, Intensity: Mild, Exercise limited by: None identified   Diet (meal preparation, eat out, water intake, caffeinated beverages, dairy products, fruits and vegetables): 24 hr recall Breakfast: oatmeal w/ nuts and blueberries Lunch: chicken scampi and salad Dinner:  whopper  Pt reports he knows he needs to  drink more water.  Goals    . maintain current healthy lifestyle (pt-stated)       Fall Risk Fall Risk  04/14/2019 04/11/2018 04/09/2017 04/09/2017 04/06/2016  Falls in the past year? 0 No No No No  Number falls in past yr: 0 - - - -  Injury with Fall? 0 - - - -    Depression Screen PHQ 2/9 Scores 04/14/2019 04/11/2018 04/09/2017 04/06/2016  PHQ - 2 Score 0 0 0 0  Exception Documentation - - Patient refusal -    Cognitive Function Ad8 score reviewed for issues:  Issues making decisions:no  Less interest in hobbies / activities:no  Repeats questions, stories (family complaining):no  Trouble using ordinary gadgets (microwave, computer, phone):no  Forgets the month or year: no  Mismanaging finances: no  Remembering appts: no  Daily problems with thinking and/or memory:no Ad8 score is=0     MMSE - Mini Mental State Exam 04/09/2017  Orientation to time 5  Orientation to Place 5  Registration 3  Attention/ Calculation 5  Recall 3  Language- name 2 objects 2  Language- repeat 1  Language- follow 3 step command 3  Language- read & follow direction 1  Write a sentence 1  Copy design 1  Total score 30        Immunization History  Administered Date(s) Administered  . Influenza Split 07/04/2006  . Influenza, High Dose Seasonal PF 04/06/2016, 04/09/2017, 04/11/2018  . Influenza,inj,Quad PF,6+ Mos 03/25/2013, 04/21/2014, 05/10/2015  . Pneumococcal Conjugate-13 04/06/2016  . Pneumococcal Polysaccharide-23 04/09/2017  . Tdap 12/26/2012   Screening Tests Health Maintenance  Topic Date Due  . INFLUENZA VACCINE  02/01/2019  . TETANUS/TDAP  12/27/2022  . COLONOSCOPY  03/06/2023  . Hepatitis C Screening  Completed  . PNA vac Low Risk Adult  Completed     Plan:    Please schedule your next medicare wellness visit with me in 1 yr.  Continue to eat heart healthy diet (full of fruits, vegetables, whole grains, lean protein, water--limit salt, fat, and sugar intake)  and increase physical activity as tolerated.  Continue doing brain stimulating activities (puzzles, reading, adult coloring books, staying active) to keep memory sharp.   Bring a copy of your living will and/or healthcare power of attorney to your next office visit.   I have personally reviewed and noted the following in the patient's chart:   . Medical and social history . Use of alcohol, tobacco or illicit drugs  . Current medications and supplements . Functional ability and status . Nutritional status . Physical activity . Advanced directives . List of other physicians . Hospitalizations, surgeries, and ER visits in previous 12 months . Vitals . Screenings to include cognitive, depression, and falls . Referrals and appointments  In addition, I have reviewed and discussed with patient certain preventive protocols, quality metrics, and best practice recommendations. A written personalized care plan for preventive services as well as general preventive health recommendations were provided to patient.     Shela Nevin, [place]  04/14/2019  PCP NOTE: Pt states he occasionally gets dizzy when he stands up quickly and had an episode 2 months ago that he almost passed out. Reports has not happened since so he did not contact PCP. Has appt w/ PCP at 140 this afternoon and will discuss then.    I have reviewed the above MWE by Ms. Vevelyn Royals, and agree with her documentation Denny Peon MD

## 2019-04-14 ENCOUNTER — Ambulatory Visit (INDEPENDENT_AMBULATORY_CARE_PROVIDER_SITE_OTHER): Payer: Medicare HMO | Admitting: *Deleted

## 2019-04-14 ENCOUNTER — Ambulatory Visit (INDEPENDENT_AMBULATORY_CARE_PROVIDER_SITE_OTHER): Payer: Medicare HMO | Admitting: Family Medicine

## 2019-04-14 ENCOUNTER — Other Ambulatory Visit: Payer: Self-pay

## 2019-04-14 ENCOUNTER — Encounter: Payer: Self-pay | Admitting: Family Medicine

## 2019-04-14 ENCOUNTER — Encounter: Payer: Self-pay | Admitting: *Deleted

## 2019-04-14 VITALS — BP 112/72 | HR 64 | Temp 97.1°F | Resp 16 | Ht 68.0 in | Wt 169.0 lb

## 2019-04-14 DIAGNOSIS — Z0000 Encounter for general adult medical examination without abnormal findings: Secondary | ICD-10-CM

## 2019-04-14 DIAGNOSIS — R202 Paresthesia of skin: Secondary | ICD-10-CM | POA: Diagnosis not present

## 2019-04-14 DIAGNOSIS — Z5181 Encounter for therapeutic drug level monitoring: Secondary | ICD-10-CM | POA: Diagnosis not present

## 2019-04-14 DIAGNOSIS — R972 Elevated prostate specific antigen [PSA]: Secondary | ICD-10-CM | POA: Diagnosis not present

## 2019-04-14 DIAGNOSIS — L4050 Arthropathic psoriasis, unspecified: Secondary | ICD-10-CM | POA: Diagnosis not present

## 2019-04-14 DIAGNOSIS — Z13220 Encounter for screening for lipoid disorders: Secondary | ICD-10-CM | POA: Diagnosis not present

## 2019-04-14 DIAGNOSIS — R7303 Prediabetes: Secondary | ICD-10-CM | POA: Diagnosis not present

## 2019-04-14 DIAGNOSIS — Z131 Encounter for screening for diabetes mellitus: Secondary | ICD-10-CM | POA: Diagnosis not present

## 2019-04-14 LAB — COMPREHENSIVE METABOLIC PANEL
ALT: 13 U/L (ref 0–53)
AST: 17 U/L (ref 0–37)
Albumin: 4.2 g/dL (ref 3.5–5.2)
Alkaline Phosphatase: 59 U/L (ref 39–117)
BUN: 15 mg/dL (ref 6–23)
CO2: 27 mEq/L (ref 19–32)
Calcium: 9.1 mg/dL (ref 8.4–10.5)
Chloride: 107 mEq/L (ref 96–112)
Creatinine, Ser: 1.13 mg/dL (ref 0.40–1.50)
GFR: 64.41 mL/min (ref >60.00)
Glucose, Bld: 90 mg/dL (ref 70–99)
Potassium: 4.4 mEq/L (ref 3.5–5.1)
Sodium: 142 mEq/L (ref 135–145)
Total Bilirubin: 0.5 mg/dL (ref 0.2–1.2)
Total Protein: 6.2 g/dL (ref 6.0–8.3)

## 2019-04-14 LAB — CBC
HCT: 41.6 % (ref 39.0–52.0)
Hemoglobin: 14.1 g/dL (ref 13.0–17.0)
MCHC: 34 g/dL (ref 30.0–36.0)
MCV: 101.7 fl — ABNORMAL HIGH (ref 78.0–100.0)
Platelets: 212 10*3/uL (ref 150.0–400.0)
RBC: 4.09 Mil/uL — ABNORMAL LOW (ref 4.22–5.81)
RDW: 14.7 % (ref 11.5–15.5)
WBC: 6.5 10*3/uL (ref 4.0–10.5)

## 2019-04-14 LAB — LIPID PANEL
Cholesterol: 138 mg/dL (ref 0–200)
HDL: 47.5 mg/dL (ref >39.00)
LDL Cholesterol: 80 mg/dL (ref 0–99)
NonHDL: 90.53
Total CHOL/HDL Ratio: 3
Triglycerides: 53 mg/dL (ref 0.0–149.0)
VLDL: 10.6 mg/dL (ref 0.0–40.0)

## 2019-04-14 LAB — PSA: PSA: 2.21 ng/mL (ref 0.10–4.00)

## 2019-04-14 LAB — HEMOGLOBIN A1C: Hgb A1c MFr Bld: 5.7 % (ref 4.6–6.5)

## 2019-04-14 NOTE — Patient Instructions (Signed)
Please schedule your next medicare wellness visit with me in 1 yr.  Continue to eat heart healthy diet (full of fruits, vegetables, whole grains, lean protein, water--limit salt, fat, and sugar intake) and increase physical activity as tolerated.  Continue doing brain stimulating activities (puzzles, reading, adult coloring books, staying active) to keep memory sharp.   Bring a copy of your living will and/or healthcare power of attorney to your next office visit.   Randall Scott , Thank you for taking time to come for your Medicare Wellness Visit. I appreciate your ongoing commitment to your health goals. Please review the following plan we discussed and let me know if I can assist you in the future.   These are the goals we discussed: Goals    . maintain current healthy lifestyle (pt-stated)       This is a list of the screening recommended for you and due dates:  Health Maintenance  Topic Date Due  . Flu Shot  02/01/2019  . Tetanus Vaccine  12/27/2022  . Colon Cancer Screening  03/06/2023  .  Hepatitis C: One time screening is recommended by Center for Disease Control  (CDC) for  adults born from [birth year] through 1965.   Completed  . Pneumonia vaccines  Completed    Health Maintenance After Age 21 After age 30, you are at a higher risk for certain long-term diseases and infections as well as injuries from falls. Falls are a major cause of broken bones and head injuries in people who are older than age 56. Getting regular preventive care can help to keep you healthy and well. Preventive care includes getting regular testing and making lifestyle changes as recommended by your health care provider. Talk with your health care provider about:  Which screenings and tests you should have. A screening is a test that checks for a disease when you have no symptoms.  A diet and exercise plan that is right for you. What should I know about screenings and tests to prevent falls? Screening and  testing are the best ways to find a health problem early. Early diagnosis and treatment give you the best chance of managing medical conditions that are common after age 42. Certain conditions and lifestyle choices may make you more likely to have a fall. Your health care provider may recommend:  Regular vision checks. Poor vision and conditions such as cataracts can make you more likely to have a fall. If you wear glasses, make sure to get your prescription updated if your vision changes.  Medicine review. Work with your health care provider to regularly review all of the medicines you are taking, including over-the-counter medicines. Ask your health care provider about any side effects that may make you more likely to have a fall. Tell your health care provider if any medicines that you take make you feel dizzy or sleepy.  Osteoporosis screening. Osteoporosis is a condition that causes the bones to get weaker. This can make the bones weak and cause them to break more easily.  Blood pressure screening. Blood pressure changes and medicines to control blood pressure can make you feel dizzy.  Strength and balance checks. Your health care provider may recommend certain tests to check your strength and balance while standing, walking, or changing positions.  Foot health exam. Foot pain and numbness, as well as not wearing proper footwear, can make you more likely to have a fall.  Depression screening. You may be more likely to have a fall if you  have a fear of falling, feel emotionally low, or feel unable to do activities that you used to do.  Alcohol use screening. Using too much alcohol can affect your balance and may make you more likely to have a fall. What actions can I take to lower my risk of falls? General instructions  Talk with your health care provider about your risks for falling. Tell your health care provider if: ? You fall. Be sure to tell your health care provider about all falls,  even ones that seem minor. ? You feel dizzy, sleepy, or off-balance.  Take over-the-counter and prescription medicines only as told by your health care provider. These include any supplements.  Eat a healthy diet and maintain a healthy weight. A healthy diet includes low-fat dairy products, low-fat (lean) meats, and fiber from whole grains, beans, and lots of fruits and vegetables. Home safety  Remove any tripping hazards, such as rugs, cords, and clutter.  Install safety equipment such as grab bars in bathrooms and safety rails on stairs.  Keep rooms and walkways well-lit. Activity   Follow a regular exercise program to stay fit. This will help you maintain your balance. Ask your health care provider what types of exercise are appropriate for you.  If you need a cane or walker, use it as recommended by your health care provider.  Wear supportive shoes that have nonskid soles. Lifestyle  Do not drink alcohol if your health care provider tells you not to drink.  If you drink alcohol, limit how much you have: ? 0-1 drink a day for women. ? 0-2 drinks a day for men.  Be aware of how much alcohol is in your drink. In the [place] one drink equals one typical bottle of beer (12 oz), one-half glass of wine (5 oz), or one shot of hard liquor (1 oz).  Do not use any products that contain nicotine or tobacco, such as cigarettes and e-cigarettes. If you need help quitting, ask your health care provider. Summary  Having a healthy lifestyle and getting preventive care can help to protect your health and wellness after age 71.  Screening and testing are the best way to find a health problem early and help you avoid having a fall. Early diagnosis and treatment give you the best chance for managing medical conditions that are more common for people who are older than age 57.  Falls are a major cause of broken bones and head injuries in people who are older than age 65. Take precautions to  prevent a fall at home.  Work with your health care provider to learn what changes you can make to improve your health and wellness and to prevent falls. This information is not intended to replace advice given to you by your health care provider. Make sure you discuss any questions you have with your health care provider. Document Released: 05/02/2017 Document Revised: 10/10/2018 Document Reviewed: 05/02/2017 Elsevier Patient Education  2020 [employer].

## 2019-04-14 NOTE — Progress Notes (Signed)
Randall Scott at [facility] [address] [place], [place] [address] [phone]  Date:  04/14/2019   Name:  Randall Scott   DOB:  11-16-1950   MRN:  [id number]  PCP:  Randall Mclean, MD    Chief Complaint: Annual Exam   History of Present Illness:  Randall Scott is a 68 y.o. 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 He has psoriatic arthritis and psoriasis. Treated with methotrexate and aleve  Most recent hematology note from August of this year.  He was able to stop Xarelto and start on baby aspirin.  If he were to have another clot, he would need to go back on lifelong anticoagulation.  Hematology plans to see him 1 more time in 6 months and then released from care  Last seen by myself about 1 year ago for physical exam Married to Randall Scott  Flu vaccine- done  Shingrix done at pharmacy; both doses done.  He had SE but he got though it  Can do a PSA, lipid panel, A1c today Colon cancer screening is up-to-date Pneumonia vaccines current, tetanus current  2-3 months ago he noted a pain in his right shoulder/ axilla.  He was standing outside and felt like he might pass out. He sat down and rested, felt well again This has not occurred again -he agreed to tell me if this does happen again, and we can look further  He is walking for exercise about 45 minutes most days.  No CP or SOB with exercise He notes some gradual worsening of his hearing and distance visiton  He hears ok without background noise  We discussed hearing aids/ audiology testing   BP Readings from Last 3 Encounters:  04/14/19 112/72  02/17/19 119/70  08/19/18 127/81      Patient Active Problem List   Diagnosis  Date Noted  . Psoriatic arthritis ([place]) 04/14/2019  . Factor V Leiden mutation ([place]) 04/07/2017  . Deep vein thrombosis (DVT) of proximal vein of right lower extremity ([place]) 03/01/2017  . Elevated PSA 06/28/2016  . 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 Headache(784.0) 08/21/2013  . Lateral epicondylitis of right elbow 04/01/2013    Past Medical History:  Diagnosis Date  . Allergy   . Arthritis   . Asthma   . Cataract     Past Surgical History:  Procedure Laterality Date  . EYE SURGERY    . HERNIA REPAIR    . PERIPHERAL VASCULAR THROMBECTOMY Right    RLE    Social History   Tobacco Use  . Smoking status: Never Smoker  . Smokeless tobacco: Never Used  Substance Use Topics  . Alcohol use: No  . Drug use: No    Family History  Problem Relation Age of Onset  . Lung cancer Mother   . Lung cancer Father   . Sudden death Neg Hx   . Hypertension Neg Hx   . Hyperlipidemia Neg Hx   . Heart attack Neg Hx   . Diabetes Neg Hx     No Known Allergies  Medication list has been reviewed and updated.  Current Outpatient Medications on File Prior to Visit  Medication Sig Dispense Refill  . aspirin  81 MG chewable tablet Chew 162 mg by mouth daily.    . folic acid (FOLVITE) 1 MG tablet Take 1 mg by mouth daily.    . methotrexate 2.5 MG tablet Take by mouth once a week.    Marland Kitchen omeprazole (PRILOSEC) 40 MG capsule Take 40 mg by mouth daily.    . vitamin A 10000 UNIT capsule Take 10,000 Units by mouth daily.     No current facility-administered medications on file prior to visit.     Review of Systems:  As per HPI- otherwise negative. No fever or chills No CP or SOB  Physical Examination: Vitals:   04/14/19 1344  BP: 112/72  Pulse: 64  Resp: 16  Temp: (!) 97.1 F (36.2 C)  SpO2: 98%   Vitals:   04/14/19 1344  Weight: 169 lb (76.7 kg)  Height: 5\' 8"  (1.727 m)   Body mass index is 25.7 kg/m. Ideal Body Weight: Weight in (lb) to have BMI = 25: 164.1  GEN: WDWN,  NAD, Non-toxic, A & O x 3, looks well, minimal overweight HEENT: Atraumatic, Normocephalic. Neck supple. No masses, No LAD.  TM wnl  Ears and Nose: No external deformity. CV: RRR, No M/G/R. No JVD. No thrill. No extra heart sounds. PULM: CTA B, no wheezes, crackles, rhonchi. No retractions. No resp. distress. No accessory muscle use. ABD: S, NT, ND, +BS. No rebound. No HSM. EXTR: No c/c/e NEURO Normal gait.  PSYCH: Normally interactive. Conversant. Not depressed or anxious appearing.  Calm demeanor.  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  He would like to check a CBC and see me today, so these labs will be available for his visit next month.  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  Gave a handout with more information and some conservative remedies that might be helpful, including topicals and stretching  Signed Randall Blinks, MD  Received his labs, letter to patient A1c is stable, prediabetes range  Results for orders placed or performed in visit on 04/14/19  Hemoglobin A1c  Result Value Ref Range   Hgb A1c MFr Bld 5.7 4.6 - 6.5 %  Lipid panel  Result Value Ref Range   Cholesterol 138 0 - 200 mg/dL   Triglycerides 53.0 0.0 - 149.0 mg/dL   HDL 47.50 >39.00 mg/dL   VLDL 10.6 0.0 - 40.0 mg/dL   LDL Cholesterol 80 0 - 99 mg/dL   Total CHOL/HDL Ratio 3    NonHDL 90.53   PSA  Result Value Ref Range   PSA 2.21 0.10  - 4.00 ng/mL  CBC  Result Value Ref Range   WBC 6.5 4.0 - 10.5 K/uL   RBC 4.09 (L) 4.22 - 5.81 Mil/uL   Platelets 212.0 150.0 - 400.0 K/uL   Hemoglobin 14.1 13.0 - 17.0 g/dL   HCT 41.6 39.0 - 52.0 %   MCV 101.7 (H) 78.0 - 100.0 fl   MCHC 34.0 30.0 - 36.0 g/dL  RDW 14.7 11.5 - 15.5 %  Comprehensive metabolic panel  Result Value Ref Range   Sodium 142 135 - 145 mEq/L   Potassium 4.4 3.5 - 5.1 mEq/L   Chloride 107 96 - 112 mEq/L   CO2 27 19 - 32 mEq/L   Glucose, Bld 90 70 - 99 mg/dL   BUN 15 6 - 23 mg/dL   Creatinine, Ser 1.13 0.40 - 1.50 mg/dL   Total Bilirubin 0.5 0.2 - 1.2 mg/dL   Alkaline Phosphatase 59 39 - 117 U/L   AST 17 0 - 37 U/L   ALT 13 0 - 53 U/L   Total Protein 6.2 6.0 - 8.3 g/dL   Albumin 4.2 3.5 - 5.2 g/dL   Calcium 9.1 8.4 - 10.5 mg/dL   GFR 64.41 >60.00 mL/min

## 2019-04-14 NOTE — Patient Instructions (Addendum)
It was great to see you again today, I will be in touch with your labs ASAP I will mail you a copy of your labs to take to your next rheumatology appt I think you have Notalgia paresthetica causing the itching of your back.  This is a benign but annoying problem!  I gave you a handout with some suggestions that might help    Health Maintenance After Age 68 After age 67, you are at a higher risk for certain long-term diseases and infections as well as injuries from falls. Falls are a major cause of broken bones and head injuries in people who are older than age 53. Getting regular preventive care can help to keep you healthy and well. Preventive care includes getting regular testing and making lifestyle changes as recommended by your health care provider. Talk with your health care provider about:  Which screenings and tests you should have. A screening is a test that checks for a disease when you have no symptoms.  A diet and exercise plan that is right for you. What should I know about screenings and tests to prevent falls? Screening and testing are the best ways to find a health problem early. Early diagnosis and treatment give you the best chance of managing medical conditions that are common after age 83. Certain conditions and lifestyle choices may make you more likely to have a fall. Your health care provider may recommend:  Regular vision checks. Poor vision and conditions such as cataracts can make you more likely to have a fall. If you wear glasses, make sure to get your prescription updated if your vision changes.  Medicine review. Work with your health care provider to regularly review all of the medicines you are taking, including over-the-counter medicines. Ask your health care provider about any side effects that may make you more likely to have a fall. Tell your health care provider if any medicines that you take make you feel dizzy or sleepy.  Osteoporosis screening. Osteoporosis is  a condition that causes the bones to get weaker. This can make the bones weak and cause them to break more easily.  Blood pressure screening. Blood pressure changes and medicines to control blood pressure can make you feel dizzy.  Strength and balance checks. Your health care provider may recommend certain tests to check your strength and balance while standing, walking, or changing positions.  Foot health exam. Foot pain and numbness, as well as not wearing proper footwear, can make you more likely to have a fall.  Depression screening. You may be more likely to have a fall if you have a fear of falling, feel emotionally low, or feel unable to do activities that you used to do.  Alcohol use screening. Using too much alcohol can affect your balance and may make you more likely to have a fall. What actions can I take to lower my risk of falls? General instructions  Talk with your health care provider about your risks for falling. Tell your health care provider if: ? You fall. Be sure to tell your health care provider about all falls, even ones that seem minor. ? You feel dizzy, sleepy, or off-balance.  Take over-the-counter and prescription medicines only as told by your health care provider. These include any supplements.  Eat a healthy diet and maintain a healthy weight. A healthy diet includes low-fat dairy products, low-fat (lean) meats, and fiber from whole grains, beans, and lots of fruits and vegetables. Home safety  Remove  any tripping hazards, such as rugs, cords, and clutter.  Install safety equipment such as grab bars in bathrooms and safety rails on stairs.  Keep rooms and walkways well-lit. Activity   Follow a regular exercise program to stay fit. This will help you maintain your balance. Ask your health care provider what types of exercise are appropriate for you.  If you need a cane or walker, use it as recommended by your health care provider.  Wear supportive shoes  that have nonskid soles. Lifestyle  Do not drink alcohol if your health care provider tells you not to drink.  If you drink alcohol, limit how much you have: ? 0-1 drink a day for women. ? 0-2 drinks a day for men.  Be aware of how much alcohol is in your drink. In the [place] one drink equals one typical bottle of beer (12 oz), one-half glass of wine (5 oz), or one shot of hard liquor (1 oz).  Do not use any products that contain nicotine or tobacco, such as cigarettes and e-cigarettes. If you need help quitting, ask your health care provider. Summary  Having a healthy lifestyle and getting preventive care can help to protect your health and wellness after age 14.  Screening and testing are the best way to find a health problem early and help you avoid having a fall. Early diagnosis and treatment give you the best chance for managing medical conditions that are more common for people who are older than age 15.  Falls are a major cause of broken bones and head injuries in people who are older than age 68. Take precautions to prevent a fall at home.  Work with your health care provider to learn what changes you can make to improve your health and wellness and to prevent falls. This information is not intended to replace advice given to you by your health care provider. Make sure you discuss any questions you have with your health care provider. Document Released: 05/02/2017 Document Revised: 10/10/2018 Document Reviewed: 05/02/2017 Elsevier Patient Education  2020 [employer].

## 2019-05-09 DIAGNOSIS — L570 Actinic keratosis: Secondary | ICD-10-CM | POA: Diagnosis not present

## 2019-05-09 DIAGNOSIS — D485 Neoplasm of uncertain behavior of skin: Secondary | ICD-10-CM | POA: Diagnosis not present

## 2019-07-14 DIAGNOSIS — L2081 Atopic neurodermatitis: Secondary | ICD-10-CM | POA: Diagnosis not present

## 2019-07-14 DIAGNOSIS — L570 Actinic keratosis: Secondary | ICD-10-CM | POA: Diagnosis not present

## 2019-07-15 ENCOUNTER — Ambulatory Visit (INDEPENDENT_AMBULATORY_CARE_PROVIDER_SITE_OTHER): Payer: PPO | Admitting: Internal Medicine

## 2019-07-15 ENCOUNTER — Other Ambulatory Visit: Payer: Self-pay

## 2019-07-15 VITALS — BP 106/66 | HR 79 | Temp 96.0°F | Resp 12 | Ht 68.0 in | Wt 177.6 lb

## 2019-07-15 DIAGNOSIS — H9313 Tinnitus, bilateral: Secondary | ICD-10-CM | POA: Diagnosis not present

## 2019-07-15 DIAGNOSIS — H9193 Unspecified hearing loss, bilateral: Secondary | ICD-10-CM | POA: Diagnosis not present

## 2019-07-15 NOTE — Patient Instructions (Signed)

## 2019-07-15 NOTE — Progress Notes (Signed)
Subjective:    Patient ID: Randall Scott, male    DOB: 07-04-1950, 69 y.o.   MRN: [id number]  DOS:  07/15/2019 Type of visit - description: Acute The patient reports a long history of bilateral hearing loss, it has been more noticeable lately. Also, for few weeks has noted tinnitus in both ears.  Review of Systems No fever chills No URI type of symptoms No headache no dizziness.  Past Medical History:  Diagnosis Date  . Allergy   . Arthritis   . Asthma   . Cataract     Past Surgical History:  Procedure Laterality Date  . EYE SURGERY    . HERNIA REPAIR    . PERIPHERAL VASCULAR THROMBECTOMY Right    RLE    Social History   Socioeconomic History  . Marital status: Married    Spouse name: Caren Griffins  . Number of children: 2  . Years of education: 29  . Highest education level: Not on file  Occupational History  . Occupation: Information systems manager: [employer]  Tobacco Use  . Smoking status: Never Smoker  . Smokeless tobacco: Never Used  Substance and Sexual Activity  . Alcohol use: No  . Drug use: No  . Sexual activity: Yes    Birth control/protection: None    Comment: married  Other Topics Concern  . Not on file  Social History Narrative   Lives with his wife.  Daughters are grown and live nearby.   Patient is right handed   Education level is 12   Caffeine consumption is 2-3 cups daily   Social Determinants of Health   Financial Resource Strain:   . Difficulty of Paying Living Expenses: Not on file  Food Insecurity:   . Worried About Charity fundraiser in the Last Year: Not on file  . Ran Out of Food in the Last Year: Not on file  Transportation Needs:   . Lack of Transportation (Medical): Not on file  . Lack of Transportation (Non-Medical): Not on file  Physical Activity:   . Days of Exercise per Week: Not on file  . Minutes of Exercise per Session: Not on file  Stress:   . Feeling of Stress : Not on file  Social Connections:   .  Frequency of Communication with Friends and Family: Not on file  . Frequency of Social Gatherings with Friends and Family: Not on file  . Attends Religious Services: Not on file  . Active Member of Clubs or Organizations: Not on file  . Attends Archivist Meetings: Not on file  . Marital Status: Not on file  Intimate Partner Violence:   . Fear of Current or Ex-Partner: Not on file  . Emotionally Abused: Not on file  . Physically Abused: Not on file  . Sexually Abused: Not on file      Allergies as of 07/15/2019   No Known Allergies     Medication List       Accurate as of July 15, 2019 11:59 PM. If you have any questions, ask your nurse or doctor.        aspirin 81 MG chewable tablet Chew 162 mg by mouth daily.   folic acid 1 MG tablet Commonly known as: FOLVITE Take 1 mg by mouth daily.   methotrexate 2.5 MG tablet Take by mouth once a week.   omeprazole 40 MG capsule Commonly known as: PRILOSEC Take 40 mg by mouth daily.   vitamin A  10000 UNIT capsule Take 10,000 Units by mouth daily.           Objective:   Physical Exam BP 106/66 (BP Location: Left Arm, Cuff Size: Large)   Pulse 79   Temp (!) 96 F (35.6 C) (Temporal)   Resp 12   Ht 5\' 8"  (1.727 m)   Wt 177 lb 9.6 oz (80.6 kg)   SpO2 99%   BMI 27.00 kg/m  General:   Well developed, NAD, BMI noted. HEENT:  Normocephalic . Face symmetric, atraumatic. Right ear normal Left ear: Cerumen impaction, cerumen removed with a spoon, after that canal and TMs were normal except for some excoriation from the spoon. Skin: Not pale. Not jaundice Neurologic:  alert & oriented X3.  Speech normal, gait appropriate for age and unassisted Psych--  Cognition and judgment appear intact.  Cooperative with normal attention span and concentration.  Behavior appropriate. No anxious or depressed appearing.      Assessment    68 year old gentleman, PMH includes psoriatic arthritis, prediabetes,  elevated PSA, status post Xarelto for factor V Leyden mutation, on an aspirin daily.  Presents with  Hearing loss: Chronic, slightly worse lately, was seen by an audiologist years ago, he tried hearing aids, they did not help much and they were very expensive, he never pursue hearing aids. 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  Recommend observation. Cerumen impaction: Cleared.   This visit occurred during the SARS-CoV-2 public health emergency.  Safety protocols were in place, including screening questions prior to the visit, additional usage of staff PPE, and extensive cleaning of exam room while observing appropriate contact time as indicated for disinfecting solutions.

## 2019-08-18 ENCOUNTER — Encounter: Payer: Self-pay | Admitting: Hematology & Oncology

## 2019-08-18 ENCOUNTER — Inpatient Hospital Stay: Payer: PPO | Attending: Hematology & Oncology | Admitting: Hematology & Oncology

## 2019-08-18 ENCOUNTER — Other Ambulatory Visit: Payer: Self-pay

## 2019-08-18 ENCOUNTER — Inpatient Hospital Stay: Payer: PPO

## 2019-08-18 VITALS — BP 112/75 | HR 70 | Temp 96.9°F | Resp 18 | Wt 175.0 lb

## 2019-08-18 DIAGNOSIS — I824Y1 Acute embolism and thrombosis of unspecified deep veins of right proximal lower extremity: Secondary | ICD-10-CM

## 2019-08-18 DIAGNOSIS — K219 Gastro-esophageal reflux disease without esophagitis: Secondary | ICD-10-CM | POA: Insufficient documentation

## 2019-08-18 DIAGNOSIS — Z7982 Long term (current) use of aspirin: Secondary | ICD-10-CM | POA: Diagnosis not present

## 2019-08-18 DIAGNOSIS — Z79899 Other long term (current) drug therapy: Secondary | ICD-10-CM | POA: Diagnosis not present

## 2019-08-18 DIAGNOSIS — D6851 Activated protein C resistance: Secondary | ICD-10-CM | POA: Diagnosis not present

## 2019-08-18 DIAGNOSIS — I825Y1 Chronic embolism and thrombosis of unspecified deep veins of right proximal lower extremity: Secondary | ICD-10-CM

## 2019-08-18 DIAGNOSIS — Z7901 Long term (current) use of anticoagulants: Secondary | ICD-10-CM | POA: Insufficient documentation

## 2019-08-18 LAB — CBC WITH DIFFERENTIAL (CANCER CENTER ONLY)
Abs Immature Granulocytes: 0.03 10*3/uL (ref 0.00–0.07)
Basophils Absolute: 0 10*3/uL (ref 0.0–0.1)
Basophils Relative: 1 %
Eosinophils Absolute: 0.3 10*3/uL (ref 0.0–0.5)
Eosinophils Relative: 4 %
HCT: 44.7 % (ref 39.0–52.0)
Hemoglobin: 15 g/dL (ref 13.0–17.0)
Immature Granulocytes: 1 %
Lymphocytes Relative: 19 %
Lymphs Abs: 1.2 10*3/uL (ref 0.7–4.0)
MCH: 33.8 pg (ref 26.0–34.0)
MCHC: 33.6 g/dL (ref 30.0–36.0)
MCV: 100.7 fL — ABNORMAL HIGH (ref 80.0–100.0)
Monocytes Absolute: 0.5 10*3/uL (ref 0.1–1.0)
Monocytes Relative: 8 %
Neutro Abs: 4.2 10*3/uL (ref 1.7–7.7)
Neutrophils Relative %: 67 %
Platelet Count: 192 10*3/uL (ref 150–400)
RBC: 4.44 MIL/uL (ref 4.22–5.81)
RDW: 12.9 % (ref 11.5–15.5)
WBC Count: 6.3 10*3/uL (ref 4.0–10.5)
nRBC: 0 % (ref 0.0–0.2)

## 2019-08-18 LAB — CMP (CANCER CENTER ONLY)
ALT: 12 U/L (ref 0–44)
AST: 17 U/L (ref 15–41)
Albumin: 4.4 g/dL (ref 3.5–5.0)
Alkaline Phosphatase: 53 U/L (ref 38–126)
Anion gap: 7 (ref 5–15)
BUN: 16 mg/dL (ref 8–23)
CO2: 30 mmol/L (ref 22–32)
Calcium: 9.9 mg/dL (ref 8.9–10.3)
Chloride: 108 mmol/L (ref 98–111)
Creatinine: 1.3 mg/dL — ABNORMAL HIGH (ref 0.61–1.24)
GFR, Est AFR Am: >60 mL/min (ref >60)
GFR, Estimated: 56 mL/min — ABNORMAL LOW (ref >60)
Glucose, Bld: 101 mg/dL — ABNORMAL HIGH (ref 70–99)
Potassium: 4.8 mmol/L (ref 3.5–5.1)
Sodium: 145 mmol/L (ref 135–145)
Total Bilirubin: 0.6 mg/dL (ref 0.3–1.2)
Total Protein: 6.4 g/dL — ABNORMAL LOW (ref 6.5–8.1)

## 2019-08-18 LAB — D-DIMER, QUANTITATIVE: D-Dimer, Quant: 0.37 ug/mL-FEU (ref 0.00–0.50)

## 2019-08-18 NOTE — Progress Notes (Signed)
Hematology and Oncology Follow Up Visit  Randall Scott [id number] [birth year] 69 y.o. 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 daily-maintenance therapy for 1 year-complete in August 2020  EC ASA 162 mg po q day -- start on 02/17/2019      Interim History:  Randall Scott is back for follow-up.  We saw him 6 months ago.  Since we last saw him, he has been doing well.  He is not had any issues with nausea or vomiting.  He has had no bleeding.  He takes his aspirin in the morning.  He is doing well with the aspirin.  He has had no problems with bowels or bladder.  There is been no bleeding.  He has had no leg pain or leg swelling.  We last checked his right leg for the thromboembolic disease, and it was negative.  He has a birthday coming up.  It sounds like it will be a quiet birthday for him.  Overall, his performance status is ECOG 0.    Medications:  Current Outpatient Medications:  .  aspirin 81 MG chewable tablet, Chew 162 mg by mouth daily., Disp: , Rfl:  .  folic acid (FOLVITE) 1 MG tablet, Take 1 mg by mouth daily., Disp: , Rfl:  .  methotrexate 2.5 MG tablet, Take by mouth once a week., Disp: , Rfl:  .  omeprazole (PRILOSEC) 40 MG capsule, Take 40 mg by mouth daily., Disp: , Rfl:  .  vitamin A 10000 UNIT capsule, Take 10,000 Units by mouth daily., Disp: , Rfl:   Allergies: No Known Allergies  Past Medical History, Surgical history, Social history, and Family History were reviewed and updated.  Review of Systems: Review of Systems  Constitutional: Negative for appetite change, fatigue, fever and unexpected weight change.  HENT:   Negative for lump/mass, mouth sores, sore throat and trouble swallowing.   Respiratory: Negative for cough, hemoptysis and shortness of  breath.   Cardiovascular: Negative for leg swelling and palpitations.  Gastrointestinal: Negative for abdominal distention, abdominal pain, blood in stool, constipation, diarrhea, nausea and vomiting.  Genitourinary: Negative for bladder incontinence, dysuria, frequency and hematuria.   Musculoskeletal: Negative for arthralgias, back pain, gait problem and myalgias.  Skin: Negative for itching and rash.  Neurological: Negative for dizziness, extremity weakness, gait problem, headaches, numbness, seizures and speech difficulty.  Hematological: Does not bruise/bleed easily.  Psychiatric/Behavioral: Negative for depression and sleep disturbance. The patient is not nervous/anxious.     Physical Exam:  weight is 175 lb (79.4 kg). His temporal temperature is 96.9 F (36.1 C) (abnormal). His blood pressure is 112/75 and his pulse is 70. His respiration is 18 and oxygen saturation is 100%.   Wt Readings from Last 3 Encounters:  08/18/19 175 lb (79.4 kg)  07/15/19 177 lb 9.6 oz (80.6 kg)  04/14/19 169 lb (76.7 kg)    Physical Exam Vitals reviewed.  HENT:     Head: Normocephalic and atraumatic.  Eyes:     Pupils: Pupils are equal, round, and reactive to light.  Cardiovascular:     Rate and Rhythm: Normal rate and regular rhythm.     Heart sounds: Normal heart sounds.  Pulmonary:     Effort: Pulmonary effort is normal.     Breath sounds: Normal  breath sounds.  Abdominal:     General: Bowel sounds are normal.     Palpations: Abdomen is soft.  Musculoskeletal:        General: No tenderness or deformity. Normal range of motion.     Cervical back: Normal range of motion.  Lymphadenopathy:     Cervical: No cervical adenopathy.  Skin:    General: Skin is warm and dry.     Findings: No erythema or rash.  Neurological:     Mental Status: He is alert and oriented to person, place, and time.  Psychiatric:        Behavior: Behavior normal.        Thought Content: Thought content normal.         Judgment: Judgment normal.      Lab Results  Component Value Date   WBC 6.3 08/18/2019   HGB 15.0 08/18/2019   HCT 44.7 08/18/2019   MCV 100.7 (H) 08/18/2019   PLT 192 08/18/2019     Chemistry      Component Value Date/Time   NA 145 08/18/2019 1002   NA 147 (H) 04/13/2017 0936   K 4.8 08/18/2019 1002   K 4.4 04/13/2017 0936   CL 108 08/18/2019 1002   CL 108 04/13/2017 0936   CO2 30 08/18/2019 1002   CO2 33 04/13/2017 0936   BUN 16 08/18/2019 1002   BUN 13 04/13/2017 0936   CREATININE 1.30 (H) 08/18/2019 1002   CREATININE 1.5 (H) 04/13/2017 0936   GLU 88 02/01/2016 0000      Component Value Date/Time   CALCIUM 9.9 08/18/2019 1002   CALCIUM 10.0 04/13/2017 0936   ALKPHOS 53 08/18/2019 1002   ALKPHOS 51 04/13/2017 0936   AST 17 08/18/2019 1002   ALT 12 08/18/2019 1002   ALT 34 04/13/2017 0936   BILITOT 0.6 08/18/2019 1002      Impression and Plan: Randall Scott is a 69 year old male. He has a Factor V Leiden mutation. He is heterozygous for this.  Everything really looks good with him.  He has had no problems since we saw him.  We will go ahead and let him go from the practice now.  I am just not sure we are adding much to his medical care.  Of note, his birthday is next week.  Hopefully this will be a good birthday present for him  Volanda Napoleon, MD 2/15/202111:11 AM

## 2019-08-19 ENCOUNTER — Telehealth: Payer: Self-pay | Admitting: Hematology & Oncology

## 2019-08-19 NOTE — Telephone Encounter (Signed)
NO los 2/15

## 2019-08-26 DIAGNOSIS — H903 Sensorineural hearing loss, bilateral: Secondary | ICD-10-CM | POA: Diagnosis not present

## 2019-09-01 DIAGNOSIS — L409 Psoriasis, unspecified: Secondary | ICD-10-CM | POA: Diagnosis not present

## 2019-09-01 DIAGNOSIS — Z1589 Genetic susceptibility to other disease: Secondary | ICD-10-CM | POA: Diagnosis not present

## 2019-09-01 DIAGNOSIS — Z6826 Body mass index (BMI) 26.0-26.9, adult: Secondary | ICD-10-CM | POA: Diagnosis not present

## 2019-09-01 DIAGNOSIS — E663 Overweight: Secondary | ICD-10-CM | POA: Diagnosis not present

## 2019-09-01 DIAGNOSIS — M542 Cervicalgia: Secondary | ICD-10-CM | POA: Diagnosis not present

## 2019-09-01 DIAGNOSIS — Z79899 Other long term (current) drug therapy: Secondary | ICD-10-CM | POA: Diagnosis not present

## 2019-09-01 DIAGNOSIS — L4050 Arthropathic psoriasis, unspecified: Secondary | ICD-10-CM | POA: Diagnosis not present

## 2019-09-01 DIAGNOSIS — M2550 Pain in unspecified joint: Secondary | ICD-10-CM | POA: Diagnosis not present

## 2019-09-01 DIAGNOSIS — M545 Low back pain: Secondary | ICD-10-CM | POA: Diagnosis not present

## 2019-09-08 DIAGNOSIS — H903 Sensorineural hearing loss, bilateral: Secondary | ICD-10-CM | POA: Diagnosis not present

## 2019-09-16 ENCOUNTER — Ambulatory Visit (INDEPENDENT_AMBULATORY_CARE_PROVIDER_SITE_OTHER): Payer: PPO | Admitting: Medical

## 2019-09-16 VITALS — Temp 96.8°F

## 2019-09-16 DIAGNOSIS — J029 Acute pharyngitis, unspecified: Secondary | ICD-10-CM

## 2019-09-16 DIAGNOSIS — K0889 Other specified disorders of teeth and supporting structures: Secondary | ICD-10-CM | POA: Diagnosis not present

## 2019-09-16 DIAGNOSIS — R591 Generalized enlarged lymph nodes: Secondary | ICD-10-CM

## 2019-09-16 MED ORDER — AMOXICILLIN-POT CLAVULANATE 875-125 MG PO TABS: 1.0000 | ORAL_TABLET | Freq: Two times a day (BID) | ORAL | 0 refills | Status: DC

## 2019-09-16 NOTE — Patient Instructions (Signed)
Recent on and off with a sore throat region/tonsillar area pain.  Some associated enlarged lymph node and left lower teeth intermittent pain.  I do think is best to go ahead and give Augmentin antibiotic to cover sterile, sinuses and but antigenic infections.  Provided patient gradually improves still recommend follow-up in 10 days to do in office physical exam.  If tonsillar abnormality might consider referral to ENT.  If enlarged neck lymph node consider getting CBC and ultrasound.  Is completely normal physical exam but still having intermittent tooth pain then would recommend dentist evaluation.

## 2019-09-16 NOTE — Progress Notes (Signed)
   Subjective:    Patient ID: Randall Scott, male    DOB: July 19, 1950, 69 y.o.   MRN: [id number]  HPI  Virtual Visit via Video Note  I connected with Randall Scott on 09/16/19 at  2:40 PM EDT by a video enabled telemedicine application and verified that I am speaking with the correct person using two identifiers.  Location: Patient: home Provider: office   I discussed the limitations of evaluation and management by telemedicine and the availability of in person appointments. The patient expressed understanding and agreed to proceed.  No bp  or pulse check.  History of Present Illness: Pt has off and on st past 3 weeks. He states feels like it on the left side. He feels some pain in left side of neck.(points to submandublar node. Mild left lower tooth pain. Only filling on left lower teeth.   Pt states saw some tonsil stones. When he pressed again left side one week ago.   Observations/Objective:  General-no acute distress, pleasant, oriented. Lungs- on inspection lungs appear unlabored. Neck- no tracheal deviation or jvd on inspection. Neuro- gross motor function appears intact. Unable to visualize pharynx as video connect went out end of interview.  Assessment and Plan: Recent on and off with a sore throat region/tonsillar area pain.  Some associated enlarged lymph node and left lower teeth intermittent pain.  I do think is best to go ahead and give Augmentin antibiotic to cover sterile, sinuses and but antigenic infections.  Provided patient gradually improves still recommend follow-up in 10 days to do in office physical exam.  If tonsillar abnormality might consider referral to ENT.  If enlarged neck lymph node consider getting CBC and ultrasound.  Is completely normal physical exam but still having intermittent tooth pain then would recommend dentist evaluation.  Mackie Pai, PA-C  Follow Up Instructions:    I discussed the assessment and treatment plan with the patient. The  patient was provided an opportunity to ask questions and all were answered. The patient agreed with the plan and demonstrated an understanding of the instructions.   The patient was advised to call back or seek an in-person evaluation if the symptoms worsen or if the condition fails to improve as anticipated.  I provided 25 minutes of non-face-to-face time during this encounter.   Mackie Pai, PA-C   Review of Systems     Objective:   Physical Exam        Assessment & Plan:

## 2019-10-13 ENCOUNTER — Encounter: Payer: Self-pay | Admitting: Physician Assistant

## 2019-10-13 ENCOUNTER — Telehealth: Payer: Self-pay | Admitting: Physician Assistant

## 2019-10-13 ENCOUNTER — Other Ambulatory Visit: Payer: Self-pay

## 2019-10-13 ENCOUNTER — Ambulatory Visit: Payer: PPO | Admitting: Physician Assistant

## 2019-10-13 DIAGNOSIS — L570 Actinic keratosis: Secondary | ICD-10-CM | POA: Diagnosis not present

## 2019-10-13 MED ORDER — CLOBETASOL PROP EMOLLIENT BASE 0.05 % EX CREA: 1.0000 "application " | TOPICAL_CREAM | Freq: Two times a day (BID) | CUTANEOUS | 1 refills | Status: DC

## 2019-10-13 NOTE — Telephone Encounter (Signed)
Sent prescription for clobetasol cream to [facility] on [address] in [place].  Left message to notify patient

## 2019-10-13 NOTE — Progress Notes (Signed)
    Follow up Visit  Subjective  Randall Scott is a 69 y.o. male who presents for the following: Follow-up (NO NEW CONCERNS F/U LN2 ON SCALP AT PREVIOUS VISIT) Last visit in January 2021 we froze the scalp and the right ear. He feels this has done well. 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   Objective  Well appearing patient in no apparent distress; mood and affect are within normal limits.  A focused examination was performed including scalp, ears, forearms and hands.. Relevant physical exam findings are noted in the Assessment and Plan. Right ear appears clear. No further treatment needed.   Objective  Scalp (3): Erythematous patches with gritty scale.  Assessment & Plan  AK (actinic keratosis) (3) Scalp  Destruction of lesion - Scalp Complexity: simple   Destruction method: cryotherapy   Informed consent: discussed and consent obtained   Timeout:  patient name, date of birth, surgical site, and procedure verified Lesion destroyed using liquid nitrogen: Yes   Outcome: patient tolerated procedure well with no complications   He is to call if he wants [place] to send a prescription for the clobetasol to a different pharmacy that may be cheaper.

## 2019-10-13 NOTE — Telephone Encounter (Signed)
Patient was seen in our office today 4/12. JCB told patient to call back with name of pharmacy for Rx to be sent to. He would like that sent to [facility] on [address] in [place].

## 2019-11-19 NOTE — Addendum Note (Signed)
Addended by: Arlyss Gandy R on: 11/19/2019 05:48 PM   Modules accepted: Level of Service

## 2019-12-08 DIAGNOSIS — L409 Psoriasis, unspecified: Secondary | ICD-10-CM | POA: Diagnosis not present

## 2019-12-08 DIAGNOSIS — L4050 Arthropathic psoriasis, unspecified: Secondary | ICD-10-CM | POA: Diagnosis not present

## 2019-12-08 DIAGNOSIS — Z1589 Genetic susceptibility to other disease: Secondary | ICD-10-CM | POA: Diagnosis not present

## 2019-12-08 DIAGNOSIS — R5383 Other fatigue: Secondary | ICD-10-CM | POA: Diagnosis not present

## 2019-12-08 DIAGNOSIS — M2550 Pain in unspecified joint: Secondary | ICD-10-CM | POA: Diagnosis not present

## 2019-12-08 DIAGNOSIS — Z6826 Body mass index (BMI) 26.0-26.9, adult: Secondary | ICD-10-CM | POA: Diagnosis not present

## 2019-12-08 DIAGNOSIS — M545 Low back pain: Secondary | ICD-10-CM | POA: Diagnosis not present

## 2019-12-08 DIAGNOSIS — E663 Overweight: Secondary | ICD-10-CM | POA: Diagnosis not present

## 2020-03-09 DIAGNOSIS — R5383 Other fatigue: Secondary | ICD-10-CM | POA: Diagnosis not present

## 2020-03-09 DIAGNOSIS — Z1589 Genetic susceptibility to other disease: Secondary | ICD-10-CM | POA: Diagnosis not present

## 2020-03-09 DIAGNOSIS — L409 Psoriasis, unspecified: Secondary | ICD-10-CM | POA: Diagnosis not present

## 2020-03-09 DIAGNOSIS — E663 Overweight: Secondary | ICD-10-CM | POA: Diagnosis not present

## 2020-03-09 DIAGNOSIS — Z6826 Body mass index (BMI) 26.0-26.9, adult: Secondary | ICD-10-CM | POA: Diagnosis not present

## 2020-03-09 DIAGNOSIS — M545 Low back pain: Secondary | ICD-10-CM | POA: Diagnosis not present

## 2020-03-09 DIAGNOSIS — L4050 Arthropathic psoriasis, unspecified: Secondary | ICD-10-CM | POA: Diagnosis not present

## 2020-03-09 DIAGNOSIS — M2550 Pain in unspecified joint: Secondary | ICD-10-CM | POA: Diagnosis not present

## 2020-03-10 LAB — BASIC METABOLIC PANEL
BUN: 16 (ref 4–21)
CO2: 25 — AB (ref 13–22)
Chloride: 104 (ref 99–108)
Creatinine: 1.2 (ref 0.6–1.3)
Glucose: 83
Potassium: 4.7 (ref 3.4–5.3)
Sodium: 139 (ref 137–147)

## 2020-03-10 LAB — CBC AND DIFFERENTIAL
HCT: 42 (ref 41–53)
Hemoglobin: 14.6 (ref 13.5–17.5)
Platelets: 197 (ref 150–399)
WBC: 5.6

## 2020-03-10 LAB — COMPREHENSIVE METABOLIC PANEL
Calcium: 9.3 (ref 8.7–10.7)
GFR calc non Af Amer: 61

## 2020-03-10 LAB — HEPATIC FUNCTION PANEL
Alkaline Phosphatase: 63 (ref 25–125)
Bilirubin, Total: 0.5

## 2020-03-10 LAB — HM HEPATITIS C SCREENING LAB: HM Hepatitis Screen: NEGATIVE

## 2020-03-15 ENCOUNTER — Other Ambulatory Visit: Payer: Self-pay

## 2020-03-15 ENCOUNTER — Ambulatory Visit: Payer: PPO | Admitting: Physician Assistant

## 2020-03-15 ENCOUNTER — Encounter: Payer: Self-pay | Admitting: Physician Assistant

## 2020-03-15 DIAGNOSIS — L570 Actinic keratosis: Secondary | ICD-10-CM | POA: Diagnosis not present

## 2020-03-15 NOTE — Progress Notes (Signed)
   Follow up Visit  Subjective  Randall Scott is a 69 y.o. male who presents for the following: Follow-up (Ln2 on scalp for AKs patient is doing good. ). He hasn't felt anything on top and his wife doesn't see anything. We last froze lesions in April 2021. LSC on legs has been in remission for a while. He uses his medicine if he feels an itch and has been able to keep it under control.  Objective  Well appearing patient in no apparent distress; mood and affect are within normal limits.  All skin waist up examined. No suspicious moles noted on back.   Objective  Mid Parietal Scalp (6): Erythematous patches with gritty scale.  Assessment & Plan  AK (actinic keratosis) (6) Mid Parietal Scalp  Destruction of lesion - Mid Parietal Scalp Complexity: simple   Destruction method: cryotherapy   Informed consent: discussed and consent obtained   Timeout:  patient name, date of birth, surgical site, and procedure verified Lesion destroyed using liquid nitrogen: Yes   Outcome: patient tolerated procedure well with no complications

## 2020-04-14 ENCOUNTER — Encounter: Payer: Medicare HMO | Admitting: Family Medicine

## 2020-04-14 DIAGNOSIS — M7712 Lateral epicondylitis, left elbow: Secondary | ICD-10-CM | POA: Diagnosis not present

## 2020-04-15 ENCOUNTER — Encounter: Payer: Medicare HMO | Admitting: Family Medicine

## 2020-04-20 NOTE — Progress Notes (Addendum)
[facility] [place], [place], [place] [address] [phone]  Date:  04/22/2020   Name:  Randall Scott   DOB:  1951/01/24   MRN:  [id number]  PCP:  Darreld Mclean, MD    Chief Complaint: Annual Exam (flu shot)   History of Present Illness:  Randall Scott is a 69 y.o. very pleasant male patient who presents with the following:  Patient today for physical exam History of psoriasis/psoriatic arthritis/spondyloarthropathy, factor V Leiden mutation with history of DVT 2018, prediabetes His psoriatic arthritis is managed by Dr. Trudie Reed and Marella Chimes at [facility] rheumatology Last seen by myself about 1 year ago-time he had stopped Xarelto and transitioned to baby aspirin per hematology  recommendations He is taking methotrexate 6x2.5 weekly currently He is on folic acid as well His sx are getting a bit worse- they may need to consider a different medication besides methotrexate for him in the future.    Never smoker Married to [place] He enjoys exercise by walking.  He likes to walk 3-4 days a week, walks for an hour generally He is seeing Dr Tamera Punt for tennis elbow right now- he is doing some PT and they may need to try steroids if not better   COVID-19 series- complete including booster  Flu vaccine- give today  Colon cancer screen up-to-date Can update labs today- he had a CBC and CMP per [facility] rheum recently which I will abstract  Wt Readings from Last 3 Encounters:  04/22/20 172 lb (78 kg)  08/18/19 175 lb (79.4 kg)  07/15/19 177 lb 9.6 oz (80.6 kg)    Patient Active Problem List   Diagnosis Date Noted  . Psoriatic arthritis ([place]) 04/14/2019  . Prediabetes 04/14/2019  . Factor V Leiden mutation ([place]) 04/07/2017  . Deep vein thrombosis (DVT) of proximal vein of right lower extremity ([place]) 03/01/2017  . Elevated PSA 06/28/2016  . 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 Headache(784.0) 08/21/2013  .  Lateral epicondylitis of right elbow 04/01/2013    Past Medical History:  Diagnosis Date  . Allergy   . Arthritis   . Asthma   . Cataract     Past Surgical History:  Procedure Laterality Date  . EYE SURGERY    . HERNIA REPAIR    . PERIPHERAL VASCULAR THROMBECTOMY Right    RLE    Social History   Tobacco Use  . Smoking status: Never Smoker  . Smokeless tobacco: Never Used  Substance Use Topics  . Alcohol use: No  . Drug use: No    Family History  Problem Relation Age of Onset  . Lung cancer Mother   . Lung cancer Father   . Sudden death Neg Hx   . Hypertension Neg Hx   . Hyperlipidemia Neg Hx   . Heart attack Neg Hx   . Diabetes Neg Hx     No Known Allergies  Medication list has been reviewed and updated.  Current Outpatient Medications on File Prior to Visit  Medication Sig Dispense Refill  . aspirin 81 MG chewable tablet Chew 162 mg by mouth daily.    . Clobetasol Prop Emollient Base (CLOBETASOL PROPIONATE E) 0.05 % emollient cream Apply 1 application topically 2 (two) times daily. 30 g 1  . folic acid (FOLVITE) 1 MG tablet Take 1 mg by mouth daily.    . methotrexate 2.5 MG tablet Take by mouth once a week.    Marland Kitchen  omeprazole (PRILOSEC) 40 MG capsule Take 40 mg by mouth daily.    . vitamin A 10000 UNIT capsule Take 10,000 Units by mouth daily.     No current facility-administered medications on file prior to visit.    Review of Systems:  As per HPI- otherwise negative.  No CP or SOB No digestive issues  Physical Examination: Vitals:   04/22/20 0903  BP: 110/70  Pulse: 69  Resp: 16  SpO2: 95%   Vitals:   04/22/20 0903  Weight: 172 lb (78 kg)  Height: 5\' 8"  (1.727 m)   Body mass index is 26.15 kg/m. Ideal Body Weight: Weight in (lb) to have BMI = 25: 164.1  GEN: no acute distress.  Normal weight, looks well  HEENT: Atraumatic, Normocephalic.   TM wnl bilaterally Ears and Nose: No external deformity. CV: RRR, No M/G/R. No JVD. No thrill. No  extra heart sounds. PULM: CTA B, no wheezes, crackles, rhonchi. No retractions. No resp. distress. No accessory muscle use. ABD: S, NT, ND, +BS. No rebound. No HSM. EXTR: No c/c/e PSYCH: Normally interactive. Conversant.  Left 2nd toe: OA changes at DIP joint.  He has some issues with the toenail digging into the adjacent toe which we discussed today    Assessment and Plan: Physical exam  Elevated glucose - Plan: Hemoglobin A1c  Screening for hyperlipidemia - Plan: Lipid panel  Screening for diabetes mellitus - Plan: Hemoglobin A1c  Screening for prostate cancer - Plan: PSA  Screening, deficiency anemia, iron  Needs flu shot - Plan: Flu Vaccine QUAD High Dose(Fluad)  Medication monitoring encounter - Plan: B12 and Folate Panel  Pt here today for a CPE Labs pending as above Flu shot Encouraged continued excellent exercise and diet routine Will plan further follow- up pending labs.  This visit occurred during the SARS-CoV-2 public health emergency.  Safety protocols were in place, including screening questions prior to the visit, additional usage of staff PPE, and extensive cleaning of exam room while observing appropriate contact time as indicated for disinfecting solutions.   Signed Lamar Blinks, MD  Received his labs 10/22, message to patient Results for orders placed or performed in visit on 04/22/20  Hemoglobin A1c  Result Value Ref Range   Hgb A1c MFr Bld 5.5 <5.7 % of total Hgb   Mean Plasma Glucose 111 (calc)   eAG (mmol/L) 6.2 (calc)  Lipid panel  Result Value Ref Range   Cholesterol 137 <200 mg/dL   HDL 47 > OR = 40 mg/dL   Triglycerides 56 <150 mg/dL   LDL Cholesterol (Calc) 76 mg/dL (calc)   Total CHOL/HDL Ratio 2.9 <5.0 (calc)   Non-HDL Cholesterol (Calc) 90 <130 mg/dL (calc)  PSA  Result Value Ref Range   PSA 1.94 < OR = 4.0 ng/mL  B12 and Folate Panel  Result Value Ref Range   Vitamin B-12 276 200 - 1,100 pg/mL   Folate 12.9 ng/mL

## 2020-04-21 DIAGNOSIS — M7712 Lateral epicondylitis, left elbow: Secondary | ICD-10-CM | POA: Diagnosis not present

## 2020-04-21 DIAGNOSIS — M62838 Other muscle spasm: Secondary | ICD-10-CM | POA: Diagnosis not present

## 2020-04-22 ENCOUNTER — Encounter: Payer: Self-pay | Admitting: Family Medicine

## 2020-04-22 ENCOUNTER — Ambulatory Visit: Payer: Medicare HMO | Admitting: *Deleted

## 2020-04-22 ENCOUNTER — Ambulatory Visit (INDEPENDENT_AMBULATORY_CARE_PROVIDER_SITE_OTHER): Payer: PPO | Admitting: Family Medicine

## 2020-04-22 ENCOUNTER — Other Ambulatory Visit: Payer: Self-pay

## 2020-04-22 VITALS — BP 110/70 | HR 69 | Resp 16 | Ht 68.0 in | Wt 172.0 lb

## 2020-04-22 DIAGNOSIS — Z131 Encounter for screening for diabetes mellitus: Secondary | ICD-10-CM | POA: Diagnosis not present

## 2020-04-22 DIAGNOSIS — Z13220 Encounter for screening for lipoid disorders: Secondary | ICD-10-CM | POA: Diagnosis not present

## 2020-04-22 DIAGNOSIS — R7309 Other abnormal glucose: Secondary | ICD-10-CM

## 2020-04-22 DIAGNOSIS — Z125 Encounter for screening for malignant neoplasm of prostate: Secondary | ICD-10-CM

## 2020-04-22 DIAGNOSIS — Z5181 Encounter for therapeutic drug level monitoring: Secondary | ICD-10-CM | POA: Diagnosis not present

## 2020-04-22 DIAGNOSIS — Z130 Encounter for screening for diseases of the blood and blood-forming organs and certain disorders involving the immune mechanism: Secondary | ICD-10-CM | POA: Diagnosis not present

## 2020-04-22 DIAGNOSIS — Z0000 Encounter for general adult medical examination without abnormal findings: Secondary | ICD-10-CM

## 2020-04-22 DIAGNOSIS — Z23 Encounter for immunization: Secondary | ICD-10-CM

## 2020-04-22 NOTE — Patient Instructions (Addendum)
It was good to see you again today - take care and I will be in touch with your labs  Keep up the good work with exercise Flu vaccine done today   Please see me in 6- 12 months and take care    Health Maintenance After Age 69 After age 21, you are at a higher risk for certain long-term diseases and infections as well as injuries from falls. Falls are a major cause of broken bones and head injuries in people who are older than age 74. Getting regular preventive care can help to keep you healthy and well. Preventive care includes getting regular testing and making lifestyle changes as recommended by your health care provider. Talk with your health care provider about:  Which screenings and tests you should have. A screening is a test that checks for a disease when you have no symptoms.  A diet and exercise plan that is right for you. What should I know about screenings and tests to prevent falls? Screening and testing are the best ways to find a health problem early. Early diagnosis and treatment give you the best chance of managing medical conditions that are common after age 36. Certain conditions and lifestyle choices may make you more likely to have a fall. Your health care provider may recommend:  Regular vision checks. Poor vision and conditions such as cataracts can make you more likely to have a fall. If you wear glasses, make sure to get your prescription updated if your vision changes.  Medicine review. Work with your health care provider to regularly review all of the medicines you are taking, including over-the-counter medicines. Ask your health care provider about any side effects that may make you more likely to have a fall. Tell your health care provider if any medicines that you take make you feel dizzy or sleepy.  Osteoporosis screening. Osteoporosis is a condition that causes the bones to get weaker. This can make the bones weak and cause them to break more easily.  Blood  pressure screening. Blood pressure changes and medicines to control blood pressure can make you feel dizzy.  Strength and balance checks. Your health care provider may recommend certain tests to check your strength and balance while standing, walking, or changing positions.  Foot health exam. Foot pain and numbness, as well as not wearing proper footwear, can make you more likely to have a fall.  Depression screening. You may be more likely to have a fall if you have a fear of falling, feel emotionally low, or feel unable to do activities that you used to do.  Alcohol use screening. Using too much alcohol can affect your balance and may make you more likely to have a fall. What actions can I take to lower my risk of falls? General instructions  Talk with your health care provider about your risks for falling. Tell your health care provider if: ? You fall. Be sure to tell your health care provider about all falls, even ones that seem minor. ? You feel dizzy, sleepy, or off-balance.  Take over-the-counter and prescription medicines only as told by your health care provider. These include any supplements.  Eat a healthy diet and maintain a healthy weight. A healthy diet includes low-fat dairy products, low-fat (lean) meats, and fiber from whole grains, beans, and lots of fruits and vegetables. Home safety  Remove any tripping hazards, such as rugs, cords, and clutter.  Install safety equipment such as grab bars in bathrooms and safety  rails on stairs.  Keep rooms and walkways well-lit. Activity   Follow a regular exercise program to stay fit. This will help you maintain your balance. Ask your health care provider what types of exercise are appropriate for you.  If you need a cane or walker, use it as recommended by your health care provider.  Wear supportive shoes that have nonskid soles. Lifestyle  Do not drink alcohol if your health care provider tells you not to drink.  If you  drink alcohol, limit how much you have: ? 0-1 drink a day for women. ? 0-2 drinks a day for men.  Be aware of how much alcohol is in your drink. In the [place] one drink equals one typical bottle of beer (12 oz), one-half glass of wine (5 oz), or one shot of hard liquor (1 oz).  Do not use any products that contain nicotine or tobacco, such as cigarettes and e-cigarettes. If you need help quitting, ask your health care provider. Summary  Having a healthy lifestyle and getting preventive care can help to protect your health and wellness after age 66.  Screening and testing are the best way to find a health problem early and help you avoid having a fall. Early diagnosis and treatment give you the best chance for managing medical conditions that are more common for people who are older than age 64.  Falls are a major cause of broken bones and head injuries in people who are older than age 82. Take precautions to prevent a fall at home.  Work with your health care provider to learn what changes you can make to improve your health and wellness and to prevent falls. This information is not intended to replace advice given to you by your health care provider. Make sure you discuss any questions you have with your health care provider. Document Revised: 10/10/2018 Document Reviewed: 05/02/2017 Elsevier Patient Education  2020 [employer].

## 2020-04-23 ENCOUNTER — Encounter: Payer: Self-pay | Admitting: Family Medicine

## 2020-04-23 LAB — LIPID PANEL
Cholesterol: 137 mg/dL (ref <200)
HDL: 47 mg/dL (ref >=40)
LDL Cholesterol (Calc): 76 mg/dL (calc)
Non-HDL Cholesterol (Calc): 90 mg/dL (calc) (ref <130)
Total CHOL/HDL Ratio: 2.9 (calc) (ref <5.0)
Triglycerides: 56 mg/dL (ref <150)

## 2020-04-23 LAB — B12 AND FOLATE PANEL
Folate: 12.9 ng/mL
Vitamin B-12: 276 pg/mL (ref 200–1100)

## 2020-04-23 LAB — HEMOGLOBIN A1C
Hgb A1c MFr Bld: 5.5 % of total Hgb (ref <5.7)
Mean Plasma Glucose: 111 (calc)
eAG (mmol/L): 6.2 (calc)

## 2020-04-23 LAB — PSA: PSA: 1.94 ng/mL (ref <=4.0)

## 2020-04-27 DIAGNOSIS — M62838 Other muscle spasm: Secondary | ICD-10-CM | POA: Diagnosis not present

## 2020-04-27 DIAGNOSIS — M7712 Lateral epicondylitis, left elbow: Secondary | ICD-10-CM | POA: Diagnosis not present

## 2020-04-29 DIAGNOSIS — M62838 Other muscle spasm: Secondary | ICD-10-CM | POA: Diagnosis not present

## 2020-04-29 DIAGNOSIS — M7712 Lateral epicondylitis, left elbow: Secondary | ICD-10-CM | POA: Diagnosis not present

## 2020-05-03 ENCOUNTER — Encounter: Payer: Self-pay | Admitting: Internal Medicine

## 2020-05-03 ENCOUNTER — Telehealth (INDEPENDENT_AMBULATORY_CARE_PROVIDER_SITE_OTHER): Payer: PPO | Admitting: Internal Medicine

## 2020-05-03 VITALS — Ht 68.0 in | Wt 172.0 lb

## 2020-05-03 DIAGNOSIS — J069 Acute upper respiratory infection, unspecified: Secondary | ICD-10-CM

## 2020-05-03 DIAGNOSIS — J019 Acute sinusitis, unspecified: Secondary | ICD-10-CM

## 2020-05-03 DIAGNOSIS — M62838 Other muscle spasm: Secondary | ICD-10-CM | POA: Diagnosis not present

## 2020-05-03 DIAGNOSIS — M7712 Lateral epicondylitis, left elbow: Secondary | ICD-10-CM | POA: Diagnosis not present

## 2020-05-03 MED ORDER — AMOXICILLIN 875 MG PO TABS: 875.0000 mg | ORAL_TABLET | Freq: Two times a day (BID) | ORAL | 0 refills | Status: DC

## 2020-05-03 NOTE — Progress Notes (Signed)
Subjective:    Patient ID: Randall Scott, male    DOB: 08-04-1950, 69 y.o.   MRN: [id number]  DOS:  05/03/2020 Type of visit - description: Virtual Visit via Video Note  I connected with the above patient  by a video enabled telemedicine application and verified that I am speaking with the correct person using two identifiers.   THIS ENCOUNTER IS A VIRTUAL VISIT DUE TO COVID-19 - PATIENT WAS NOT SEEN IN THE OFFICE. PATIENT HAS CONSENTED TO VIRTUAL VISIT / TELEMEDICINE VISIT   Location of patient: home  Location of provider: office  Persons participating in the virtual visit: patient, provider   I discussed the limitations of evaluation and management by telemedicine and the availability of in person appointments. The patient expressed understanding and agreed to proceed.  Acute Symptoms started 2 weeks ago, patient think he has a sinus infection: Sinuses are stopped up, congested. He has had a runny nose with mostly clear discharge. He has a frontal headache. Also mild cough, he brings up some mucus but he thinks is from the sinuses not deep mucus  from the chest. He took a antihistaminic and a nose a spray (Afrin?),  Both helped temporarily   Review of Systems Denies fever chills No chest pain no difficulty breathing No nausea vomiting or myalgias Admits to occasional sneezing but no itchy eyes or nose  Past Medical History:  Diagnosis Date  . Allergy   . Arthritis   . Asthma   . Cataract     Past Surgical History:  Procedure Laterality Date  . EYE SURGERY    . HERNIA REPAIR    . PERIPHERAL VASCULAR THROMBECTOMY Right    RLE    Allergies as of 05/03/2020   No Known Allergies     Medication List       Accurate as of May 03, 2020  4:17 PM. If you have any questions, ask your nurse or doctor.        aspirin 81 MG chewable tablet Chew 162 mg by mouth daily.   Clobetasol Prop Emollient Base 0.05 % emollient cream Commonly known as: Clobetasol Propionate  E Apply 1 application topically 2 (two) times daily.   folic acid 1 MG tablet Commonly known as: FOLVITE Take 1 mg by mouth daily.   methotrexate 2.5 MG tablet Take by mouth once a week.   omeprazole 40 MG capsule Commonly known as: PRILOSEC Take 40 mg by mouth daily.   vitamin A 10000 UNIT capsule Take 10,000 Units by mouth daily.          Objective:   Physical Exam Ht 5\' 8"  (1.727 m)   Wt 172 lb (78 kg)   BMI 26.15 kg/m  This is a virtual video visit, he is alert oriented x3, in no distress.  Face is symmetric, speaking complete sentences, voice is somewhat nasal    Assessment       69 year old gentleman, PMH includes psoriatic arthritis, prediabetes, elevated PSA, status post Xarelto for factor V Leyden mutation, on an aspirin daily.  Presents with  URI, sinusitis: Upper respiratory symptoms started 2 weeks ago, he does not look toxic, no history of fever, he is fully vaccinated against Covid and had a flu shot. Suspect a URI versus mild sinusitis. Plan: Amoxicillin, Flonase, Claritin, Tylenol and Robitussin-DM. We will send a message with instructions.     I discussed the assessment and treatment plan with the patient. The patient was provided an opportunity to ask questions  and all were answered. The patient agreed with the plan and demonstrated an understanding of the instructions.   The patient was advised to call back or seek an in-person evaluation if the symptoms worsen or if the condition fails to improve as anticipated.

## 2020-05-03 NOTE — Progress Notes (Signed)
Pre visit review using our clinic review tool, if applicable. No additional management support is needed unless otherwise documented below in the visit note. 

## 2020-05-05 DIAGNOSIS — M62838 Other muscle spasm: Secondary | ICD-10-CM | POA: Diagnosis not present

## 2020-05-05 DIAGNOSIS — M7712 Lateral epicondylitis, left elbow: Secondary | ICD-10-CM | POA: Diagnosis not present

## 2020-05-10 DIAGNOSIS — M7712 Lateral epicondylitis, left elbow: Secondary | ICD-10-CM | POA: Diagnosis not present

## 2020-05-10 DIAGNOSIS — M62838 Other muscle spasm: Secondary | ICD-10-CM | POA: Diagnosis not present

## 2020-05-12 DIAGNOSIS — M62838 Other muscle spasm: Secondary | ICD-10-CM | POA: Diagnosis not present

## 2020-05-12 DIAGNOSIS — M7712 Lateral epicondylitis, left elbow: Secondary | ICD-10-CM | POA: Diagnosis not present

## 2020-05-17 DIAGNOSIS — M7712 Lateral epicondylitis, left elbow: Secondary | ICD-10-CM | POA: Diagnosis not present

## 2020-05-17 DIAGNOSIS — M62838 Other muscle spasm: Secondary | ICD-10-CM | POA: Diagnosis not present

## 2020-05-20 DIAGNOSIS — M62838 Other muscle spasm: Secondary | ICD-10-CM | POA: Diagnosis not present

## 2020-05-20 DIAGNOSIS — M7712 Lateral epicondylitis, left elbow: Secondary | ICD-10-CM | POA: Diagnosis not present

## 2020-06-01 DIAGNOSIS — M62838 Other muscle spasm: Secondary | ICD-10-CM | POA: Diagnosis not present

## 2020-06-01 DIAGNOSIS — M7712 Lateral epicondylitis, left elbow: Secondary | ICD-10-CM | POA: Diagnosis not present

## 2020-06-04 DIAGNOSIS — M62838 Other muscle spasm: Secondary | ICD-10-CM | POA: Diagnosis not present

## 2020-06-04 DIAGNOSIS — M7712 Lateral epicondylitis, left elbow: Secondary | ICD-10-CM | POA: Diagnosis not present

## 2020-06-07 DIAGNOSIS — M62838 Other muscle spasm: Secondary | ICD-10-CM | POA: Diagnosis not present

## 2020-06-07 DIAGNOSIS — M7712 Lateral epicondylitis, left elbow: Secondary | ICD-10-CM | POA: Diagnosis not present

## 2020-06-10 DIAGNOSIS — E663 Overweight: Secondary | ICD-10-CM | POA: Diagnosis not present

## 2020-06-10 DIAGNOSIS — Z6826 Body mass index (BMI) 26.0-26.9, adult: Secondary | ICD-10-CM | POA: Diagnosis not present

## 2020-06-10 DIAGNOSIS — L4050 Arthropathic psoriasis, unspecified: Secondary | ICD-10-CM | POA: Diagnosis not present

## 2020-06-10 DIAGNOSIS — M545 Low back pain, unspecified: Secondary | ICD-10-CM | POA: Diagnosis not present

## 2020-06-10 DIAGNOSIS — Z1589 Genetic susceptibility to other disease: Secondary | ICD-10-CM | POA: Diagnosis not present

## 2020-06-10 DIAGNOSIS — Z79899 Other long term (current) drug therapy: Secondary | ICD-10-CM | POA: Diagnosis not present

## 2020-06-10 DIAGNOSIS — L409 Psoriasis, unspecified: Secondary | ICD-10-CM | POA: Diagnosis not present

## 2020-06-10 DIAGNOSIS — M2550 Pain in unspecified joint: Secondary | ICD-10-CM | POA: Diagnosis not present

## 2020-06-14 DIAGNOSIS — M62838 Other muscle spasm: Secondary | ICD-10-CM | POA: Diagnosis not present

## 2020-06-14 DIAGNOSIS — M7712 Lateral epicondylitis, left elbow: Secondary | ICD-10-CM | POA: Diagnosis not present

## 2020-06-16 DIAGNOSIS — M7712 Lateral epicondylitis, left elbow: Secondary | ICD-10-CM | POA: Diagnosis not present

## 2020-06-16 DIAGNOSIS — M62838 Other muscle spasm: Secondary | ICD-10-CM | POA: Diagnosis not present

## 2020-06-18 DIAGNOSIS — M62838 Other muscle spasm: Secondary | ICD-10-CM | POA: Diagnosis not present

## 2020-06-18 DIAGNOSIS — M7712 Lateral epicondylitis, left elbow: Secondary | ICD-10-CM | POA: Diagnosis not present

## 2020-06-23 DIAGNOSIS — M62838 Other muscle spasm: Secondary | ICD-10-CM | POA: Diagnosis not present

## 2020-06-23 DIAGNOSIS — M7712 Lateral epicondylitis, left elbow: Secondary | ICD-10-CM | POA: Diagnosis not present

## 2020-06-30 DIAGNOSIS — M7712 Lateral epicondylitis, left elbow: Secondary | ICD-10-CM | POA: Diagnosis not present

## 2020-06-30 DIAGNOSIS — M62838 Other muscle spasm: Secondary | ICD-10-CM | POA: Diagnosis not present

## 2020-07-21 ENCOUNTER — Telehealth (INDEPENDENT_AMBULATORY_CARE_PROVIDER_SITE_OTHER): Payer: Medicare PPO | Admitting: Family Medicine

## 2020-07-21 ENCOUNTER — Other Ambulatory Visit: Payer: Self-pay

## 2020-07-21 ENCOUNTER — Encounter: Payer: Self-pay | Admitting: Family Medicine

## 2020-07-21 DIAGNOSIS — U071 COVID-19: Secondary | ICD-10-CM | POA: Diagnosis not present

## 2020-07-21 MED ORDER — PREDNISONE 20 MG PO TABS: 40.0000 mg | ORAL_TABLET | Freq: Every day | ORAL | 0 refills | Status: AC

## 2020-07-21 NOTE — Progress Notes (Signed)
Chief Complaint  Patient presents with  . Covid Positive  . Sore Throat  . Sinusitis  . Headache    Randall Scott here for URI complaints. Due to COVID-19 pandemic, we are interacting via telephone. I verified patient's ID using 2 identifiers. Patient agreed to proceed with visit via this method. Patient is at home, I am at office. Patient and I are present for visit.   Duration: 4 days  Associated symptoms: sinus headache, sinus congestion, rhinorrhea, sore throat and slight cough Denies: sinus pain, itchy watery eyes, ear pain, ear drainage, wheezing, shortness of breath, myalgia and N/V/D, fevers Treatment to date: Flonase, Tylenol  Sick contacts: Yes - daughter tested positive Tested positive for covid this AM w home test.  He is triple vaccinated.    Past Medical History:  Diagnosis Date  . Allergy   . Arthritis   . Asthma   . Cataract    Exam No conversational dyspnea Age appropriate judgment and insight Nml affect and mood  COVID-19 - Plan: predniSONE (DELTASONE) 20 MG tablet  5 d pred burst 40 mg/d as his rheum doc doesn't want him on NSAIDS. This will help with throat pain. Cont Tylenol. Consider air humidifier, salt water gargles, throat lozenges.  Continue to push fluids, practice good hand hygiene, cover mouth when coughing. F/u prn. If starting to experience irreplaceable fluid loss, shaking, or shortness of breath, seek immediate care. Total time: 11 min Pt voiced understanding and agreement to the plan.  [place], DO 07/21/20 2:54 PM

## 2020-09-02 DIAGNOSIS — L4050 Arthropathic psoriasis, unspecified: Secondary | ICD-10-CM | POA: Diagnosis not present

## 2020-09-08 DIAGNOSIS — L4050 Arthropathic psoriasis, unspecified: Secondary | ICD-10-CM | POA: Diagnosis not present

## 2020-09-08 DIAGNOSIS — E663 Overweight: Secondary | ICD-10-CM | POA: Diagnosis not present

## 2020-09-08 DIAGNOSIS — Z6826 Body mass index (BMI) 26.0-26.9, adult: Secondary | ICD-10-CM | POA: Diagnosis not present

## 2020-09-08 DIAGNOSIS — Z1589 Genetic susceptibility to other disease: Secondary | ICD-10-CM | POA: Diagnosis not present

## 2020-09-08 DIAGNOSIS — L409 Psoriasis, unspecified: Secondary | ICD-10-CM | POA: Diagnosis not present

## 2020-09-08 DIAGNOSIS — Z79899 Other long term (current) drug therapy: Secondary | ICD-10-CM | POA: Diagnosis not present

## 2020-09-08 DIAGNOSIS — M2550 Pain in unspecified joint: Secondary | ICD-10-CM | POA: Diagnosis not present

## 2020-09-08 DIAGNOSIS — M545 Low back pain, unspecified: Secondary | ICD-10-CM | POA: Diagnosis not present

## 2020-10-16 NOTE — Progress Notes (Deleted)
[place] [facility] [address], [place], [place] [address] [phone]  Date:  10/21/2020   Name:  Randall Scott   DOB:  05-25-1951   MRN:  [id number]  PCP:  Darreld Mclean, MD    Chief Complaint: No chief complaint on file.   History of Present Illness:  Randall Scott is a 70 y.o. very pleasant male patient who presents with the following:  Here today for a periodic follow-up visit  Last seen by myself for a physical 6 months ago in October  History of psoriasis/psoriatic arthritis/spondyloarthropathy, factor V Leiden mutation with history of DVT 2018, prediabetes His psoriatic arthritis is managed by Dr. Trudie Reed and Marella Chimes at [facility] rheumatology Most recnet visit with rheum last month- he has started Symponi Aria infusion Now on asa for clot prevention He also had covid in January- did ok, used prednisone for sore throat   Married to [place] He enjoys exercise by walking.  He likes to walk 3-4 days a week, walks for an hour generally  covid 4th dose  shingrix done  Colon UTD  Lab Results  Component Value Date   HGBA1C 5.5 04/22/2020     Patient Active Problem List   Diagnosis Date Noted  . Psoriatic arthritis ([place]) 04/14/2019  . Prediabetes 04/14/2019  . Factor V Leiden mutation ([place]) 04/07/2017  . Deep vein thrombosis (DVT) of proximal vein of right lower extremity ([place]) 03/01/2017  . Elevated PSA 06/28/2016  . 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 Headache(784.0) 08/21/2013  . Lateral epicondylitis of right elbow 04/01/2013    Past Medical History:  Diagnosis Date  . Allergy   . Arthritis   . Asthma   . Cataract     Past Surgical History:  Procedure Laterality Date  . EYE SURGERY    . HERNIA REPAIR    . PERIPHERAL VASCULAR THROMBECTOMY Right    RLE    Social History   Tobacco Use  . Smoking status: Never Smoker  . Smokeless tobacco: Never Used  Substance Use Topics  . Alcohol use: No   . Drug use: No    Family History  Problem Relation Age of Onset  . Lung cancer Mother   . Lung cancer Father   . Sudden death Neg Hx   . Hypertension Neg Hx   . Hyperlipidemia Neg Hx   . Heart attack Neg Hx   . Diabetes Neg Hx     No Known Allergies  Medication list has been reviewed and updated.  Current Outpatient Medications on File Prior to Visit  Medication Sig Dispense Refill  . aspirin 81 MG chewable tablet Chew 162 mg by mouth daily.    . Clobetasol Prop Emollient Base (CLOBETASOL PROPIONATE E) 0.05 % emollient cream Apply 1 application topically 2 (two) times daily. 30 g 1  . folic acid (FOLVITE) 1 MG tablet Take 1 mg by mouth daily.    . methotrexate 2.5 MG tablet Take by mouth once a week.    Marland Kitchen omeprazole (PRILOSEC) 40 MG capsule Take 40 mg by mouth daily.    . vitamin A 10000 UNIT capsule Take 10,000 Units by mouth daily.     No current facility-administered medications on file prior to visit.    Review of Systems:  As per HPI- otherwise negative.   Physical Examination: There were no vitals filed for this visit. There were no vitals filed for this visit. There is no height  or weight on file to calculate BMI. Ideal Body Weight:    GEN: no acute distress. HEENT: Atraumatic, Normocephalic.  Ears and Nose: No external deformity. CV: RRR, No M/G/R. No JVD. No thrill. No extra heart sounds. PULM: CTA B, no wheezes, crackles, rhonchi. No retractions. No resp. distress. No accessory muscle use. ABD: S, NT, ND, +BS. No rebound. No HSM. EXTR: No c/c/e PSYCH: Normally interactive. Conversant.    Assessment and Plan: *** This visit occurred during the SARS-CoV-2 public health emergency.  Safety protocols were in place, including screening questions prior to the visit, additional usage of staff PPE, and extensive cleaning of exam room while observing appropriate contact time as indicated for disinfecting solutions.    Signed Lamar Blinks, MD

## 2020-10-16 NOTE — Patient Instructions (Incomplete)
Great to see you again today!  Assuming all is well we can plan to visit in about 6 months

## 2020-10-21 ENCOUNTER — Ambulatory Visit: Payer: PPO | Admitting: Family Medicine

## 2020-10-21 DIAGNOSIS — Z5181 Encounter for therapeutic drug level monitoring: Secondary | ICD-10-CM

## 2020-10-21 DIAGNOSIS — L4050 Arthropathic psoriasis, unspecified: Secondary | ICD-10-CM

## 2020-10-21 DIAGNOSIS — R7309 Other abnormal glucose: Secondary | ICD-10-CM

## 2020-10-28 DIAGNOSIS — L4050 Arthropathic psoriasis, unspecified: Secondary | ICD-10-CM | POA: Diagnosis not present

## 2020-12-15 DIAGNOSIS — L409 Psoriasis, unspecified: Secondary | ICD-10-CM | POA: Diagnosis not present

## 2020-12-15 DIAGNOSIS — Z6826 Body mass index (BMI) 26.0-26.9, adult: Secondary | ICD-10-CM | POA: Diagnosis not present

## 2020-12-15 DIAGNOSIS — Z1589 Genetic susceptibility to other disease: Secondary | ICD-10-CM | POA: Diagnosis not present

## 2020-12-15 DIAGNOSIS — M545 Low back pain, unspecified: Secondary | ICD-10-CM | POA: Diagnosis not present

## 2020-12-15 DIAGNOSIS — L4050 Arthropathic psoriasis, unspecified: Secondary | ICD-10-CM | POA: Diagnosis not present

## 2020-12-15 DIAGNOSIS — Z79899 Other long term (current) drug therapy: Secondary | ICD-10-CM | POA: Diagnosis not present

## 2020-12-15 DIAGNOSIS — E663 Overweight: Secondary | ICD-10-CM | POA: Diagnosis not present

## 2020-12-16 DIAGNOSIS — L821 Other seborrheic keratosis: Secondary | ICD-10-CM | POA: Diagnosis not present

## 2020-12-16 DIAGNOSIS — W908XXS Exposure to other nonionizing radiation, sequela: Secondary | ICD-10-CM | POA: Diagnosis not present

## 2020-12-16 DIAGNOSIS — D225 Melanocytic nevi of trunk: Secondary | ICD-10-CM | POA: Diagnosis not present

## 2020-12-16 DIAGNOSIS — L578 Other skin changes due to chronic exposure to nonionizing radiation: Secondary | ICD-10-CM | POA: Diagnosis not present

## 2020-12-16 DIAGNOSIS — L814 Other melanin hyperpigmentation: Secondary | ICD-10-CM | POA: Diagnosis not present

## 2020-12-16 DIAGNOSIS — L570 Actinic keratosis: Secondary | ICD-10-CM | POA: Diagnosis not present

## 2020-12-16 DIAGNOSIS — G548 Other nerve root and plexus disorders: Secondary | ICD-10-CM | POA: Diagnosis not present

## 2020-12-16 DIAGNOSIS — X32XXXS Exposure to sunlight, sequela: Secondary | ICD-10-CM | POA: Diagnosis not present

## 2020-12-17 ENCOUNTER — Telehealth: Payer: Self-pay | Admitting: *Deleted

## 2020-12-17 DIAGNOSIS — R051 Acute cough: Secondary | ICD-10-CM | POA: Diagnosis not present

## 2020-12-17 DIAGNOSIS — J019 Acute sinusitis, unspecified: Secondary | ICD-10-CM | POA: Diagnosis not present

## 2020-12-17 DIAGNOSIS — J209 Acute bronchitis, unspecified: Secondary | ICD-10-CM | POA: Diagnosis not present

## 2020-12-17 DIAGNOSIS — R0981 Nasal congestion: Secondary | ICD-10-CM | POA: Diagnosis not present

## 2020-12-17 NOTE — Telephone Encounter (Signed)
Caller Name Chardon Phone Number [phone] Patient Name Randall Scott Patient DOB June 18, 1951 Call Type Message Only Information Provided Reason for Call Request to Schedule Office Appointment Initial Comment Caller asking for a virtual or tele visit today/ he knows Dr. Lorelei Pont is not in on Friday so anyone available -- caller states he has sinus pressure with nasal congestion Patient request to speak to RN No Disp. Time Disposition Final User 12/17/2020 7:38:59 AM General Information Provided Yes Kenton Kingfisher, [place]

## 2020-12-17 NOTE — Telephone Encounter (Signed)
Left message on machine to call back to schedule appointment.

## 2020-12-31 DIAGNOSIS — L4050 Arthropathic psoriasis, unspecified: Secondary | ICD-10-CM | POA: Diagnosis not present

## 2021-01-06 ENCOUNTER — Ambulatory Visit: Payer: Medicare PPO | Attending: Rheumatology

## 2021-01-06 ENCOUNTER — Other Ambulatory Visit: Payer: Self-pay

## 2021-01-06 DIAGNOSIS — R29898 Other symptoms and signs involving the musculoskeletal system: Secondary | ICD-10-CM

## 2021-01-06 DIAGNOSIS — M545 Low back pain, unspecified: Secondary | ICD-10-CM | POA: Insufficient documentation

## 2021-01-06 DIAGNOSIS — G8929 Other chronic pain: Secondary | ICD-10-CM | POA: Diagnosis not present

## 2021-01-06 DIAGNOSIS — R293 Abnormal posture: Secondary | ICD-10-CM

## 2021-01-06 NOTE — Therapy (Signed)
[facility] [address]  [place], [place], [address] Phone: [phone]   Fax:  [phone]  Physical Therapy Evaluation  Patient Details  Name: Randall Scott MRN: [id number] Date of Birth: [birth year] Referring Provider (PT): Gavin Pound, MD   Encounter Date: 01/06/2021   PT End of Session - 01/06/21 1842     Visit Number 1    Number of Visits 16    Date for PT Re-Evaluation 03/03/21    Authorization Type [employer]    Progress Note Due on Visit 10    PT Start Time 1530    PT Stop Time 1615    PT Time Calculation (min) 45 min    Activity Tolerance Patient tolerated treatment well    Behavior During Therapy [facility] for tasks assessed/performed             Past Medical History:  Diagnosis Date   Allergy    Arthritis    Asthma    Cataract     Past Surgical History:  Procedure Laterality Date   EYE SURGERY     HERNIA REPAIR     PERIPHERAL VASCULAR THROMBECTOMY Right    RLE    There were no vitals filed for this visit.    Subjective Assessment - 01/06/21 1532     Subjective Pt reports he initially hurt his back 50 years ago, leg pressing too much weight at the gym. He has had severe cases putting him down for 2-3 days. Currently, he has stiffness and low level pain that requires stretching to even move in the mornings. He likes to do yardwork, but it hurts after awhile. He "used to loved bowling, but it just killed my back."    Pertinent History Blood clots R LE    Limitations Sitting;Standing;Lifting    How long can you sit comfortably? when it's already hurting, it hurts more with prolonged sitting    How long can you stand comfortably? 15-20 minutes    How long can you walk comfortably? 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-3    Patient Stated Goals would like to be out of pain, not limited by pain    Currently in Pain? Yes     Pain Score 2    10/10 with severe episode   Pain Location Back    Pain Orientation Right;Left;Lower   R>L   Pain Descriptors / Indicators Dull;Aching    Pain Type Chronic pain    Pain Onset More than a month ago    Pain Frequency Constant    Aggravating Factors  standing for long periods, yard work (mowing, trimming), bending, lifting    Pain Relieving Factors Ice, only Ibuprofen if severe pain                [facility] PT Assessment - 01/06/21 0001       Assessment   Medical Diagnosis Lumbar DDD    Referring Provider (PT) Gavin Pound, MD    Onset Date/Surgical Date --   years   Hand Dominance Right    Next MD Visit ~6 months    Prior Therapy R frozen shoulder, L lateral epicondylitis      Precautions   Precautions None      Restrictions   Weight Bearing Restrictions No      Balance Screen   Has the patient fallen in the past 6 months  No    Has the patient had a decrease in activity level because of a fear of falling?  No    Is the patient reluctant to leave their home because of a fear of falling?  No      Prior Function   Level of Independence Independent    Vocation Retired    Leisure lay on couch, house work, playing with grandkids      Observation/Other Assessments   Focus on Therapeutic Outcomes (FOTO)  47% ability, 61% expected      ROM / Strength   AROM / PROM / Strength AROM      AROM   AROM Assessment Site Lumbar    Lumbar Flexion 40    Lumbar Extension 20    Lumbar - Right Side Bend 15    Lumbar - Left Side Bend 25    Lumbar - Right Rotation ~30% limited    Lumbar - Left Rotation 25% limited      Transfers   Five time sit to stand comments  9.58 seconds                        Objective measurements completed on examination: See above findings.               PT Education - 01/06/21 1850     Education Details educated on alignment, lumbopelvic positioning, diagnosis, prognosis, HEP, and POC    Person(s) Educated  Patient    Methods Explanation;Demonstration;Tactile cues;Verbal cues;Handout    Comprehension Verbalized understanding;Returned demonstration;Verbal cues required;Tactile cues required              PT Short Term Goals - 01/06/21 1834       PT SHORT TERM GOAL #1   Title Pt will be I and compliant with initial HEP.    Time 3    Period Weeks    Status New    Target Date 01/27/21               PT Long Term Goals - 01/06/21 1835       PT LONG TERM GOAL #1   Title Pt will be independent with long term HEP for maintenance.    Time 8    Period Weeks    Status New    Target Date 03/03/21      PT LONG TERM GOAL #2   Title Pt will improve lumbar AROM to [facility] without pain.    Baseline see flowsheet    Time 8    Period Weeks    Status New    Target Date 03/03/21      PT LONG TERM GOAL #3   Title Pt will improve FOT      PT LONG TERM GOAL #4   Title Pt will be able to perform IADLs and yard work with <3/10 pain with proper body mechanics.    Baseline painful    Time 8    Period Weeks    Status New    Target Date 03/03/21      PT LONG TERM GOAL #5   Title Pt will lift 25# from floor to waist and 10# overhead with proper body mechanics and </=3/10 back pain.    Baseline pt has pain lifting groceries from floor    Time 8    Period Weeks    Status New    Target Date 03/03/21  Plan - 01/06/21 1617     Clinical Impression Statement Randall Scott is a 70 yo male who presents with chronic low back pain due to degenerative disc disease, most severe in L2-3. He is limited by stiffness that is worst after prolonged positioning, yard work (bend, lifting), and in the AM. His lumbar flexion ROM is decreased, as well as R lateral flexion and R rotation. He did get relief from supine posterior pelvic tilt, reporting stretching in low back. He was educated on alignment, lumbopelvic positioning, diagnosis, prognosis, HEP, and POC, verbalizing understanding  and consent. He would benefit from skilled PT 1-2x/week for 8 weeks to address lumbar ROM deficits, core weakness, and flexibility for improved ability to perform IADLs, yardwork and return to leisure activity.    Personal Factors and Comorbidities Age;Past/Current Experience;Time since onset of injury/illness/exacerbation;Fitness;Comorbidity 2    Comorbidities Arthritis, asthma    Examination-Activity Limitations Squat;Stand;Sit;Bend;Lift    Examination-Participation Restrictions Yard Work;Community Activity;Cleaning    Stability/Clinical Decision Making Stable/Uncomplicated    Clinical Decision Making Low    Rehab Potential Good    PT Frequency --   1-2x/week   PT Duration 8 weeks    PT Treatment/Interventions ADLs/Self Care Home Management;Aquatic Therapy;Cryotherapy;Electrical Stimulation;Iontophoresis 4mg /ml Dexamethasone;Traction;Moist Heat;Gait training;Stair training;Functional mobility training;Therapeutic activities;Therapeutic exercise;Balance training;Neuromuscular re-education;Patient/family education;Manual techniques;Passive range of motion;Dry needling;Taping;Spinal Manipulations;Joint Manipulations    PT Next Visit Plan Assess response to HEP/update PRN, initiate hip flexibility, lumbar mobility, core and hip stab    PT Dresser and Agree with Plan of Care Patient             Patient will benefit from skilled therapeutic intervention in order to improve the following deficits and impairments:  Hypomobility, Improper body mechanics, Impaired perceived functional ability, Decreased range of motion, Decreased activity tolerance, Decreased strength, Increased fascial restricitons, Impaired flexibility, Postural dysfunction, Pain  Visit Diagnosis: Chronic bilateral low back pain without sciatica  Abnormal posture  Other symptoms and signs involving the musculoskeletal system     Problem List Patient Active Problem List   Diagnosis Date  Noted   Psoriatic arthritis ([place]) 04/14/2019   Prediabetes 04/14/2019   Factor V Leiden mutation ([place]) 04/07/2017   Deep vein thrombosis (DVT) of proximal vein of right lower extremity ([place]) 03/01/2017   Elevated PSA 06/28/2016   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  [place], [place], [address] Phone: [phone]   Fax:  [phone]  Name: CRISPIN VOGEL MRN: [id number] Date of Birth: 07/04/1950

## 2021-01-06 NOTE — Patient Instructions (Signed)
Access Code: [email or web address] URL: [email or web address] Date: 01/06/2021 Prepared by: Kathreen Cornfield  Exercises Sidelying Thoracic Rotation with Open Book - 1-2 x daily - 7 x weekly - 2 sets - 10 reps Supine Lower Trunk Rotation - 1 x daily - 7 x weekly - 1 sets - 10 reps - 3-5 seconds or more if you like hold Supine Posterior Pelvic Tilt - 2 x daily - 7 x weekly - 2 sets - 10 reps - 5 seconds hold Supine Double Knee to Chest - 1 x daily - 7 x weekly - 3 sets - 10-20 seconds hold

## 2021-01-12 ENCOUNTER — Ambulatory Visit: Payer: Medicare PPO | Admitting: Physical Therapy

## 2021-01-12 ENCOUNTER — Other Ambulatory Visit: Payer: Self-pay

## 2021-01-12 DIAGNOSIS — R293 Abnormal posture: Secondary | ICD-10-CM | POA: Diagnosis not present

## 2021-01-12 DIAGNOSIS — M545 Low back pain, unspecified: Secondary | ICD-10-CM

## 2021-01-12 DIAGNOSIS — R29898 Other symptoms and signs involving the musculoskeletal system: Secondary | ICD-10-CM

## 2021-01-12 DIAGNOSIS — G8929 Other chronic pain: Secondary | ICD-10-CM

## 2021-01-12 NOTE — Therapy (Signed)
[facility] [address]  [place], [place], [address] Phone: [phone]   Fax:  [phone]  Physical Therapy Treatment  Patient Details  Name: Randall Scott MRN: [id number] Date of Birth: [birth year] Referring Provider (PT): Gavin Pound, MD   Encounter Date: 01/12/2021   PT End of Session - 01/12/21 1009     Visit Number 2    Number of Visits 16    Date for PT Re-Evaluation 03/03/21    Authorization Type [employer]    Authorization - Visit Number 2    Progress Note Due on Visit 10    PT Start Time 0930    PT Stop Time 1010    PT Time Calculation (min) 40 min    Activity Tolerance Patient tolerated treatment well    Behavior During Therapy [facility] for tasks assessed/performed             Past Medical History:  Diagnosis Date   Allergy    Arthritis    Asthma    Cataract     Past Surgical History:  Procedure Laterality Date   EYE SURGERY     HERNIA REPAIR     PERIPHERAL VASCULAR THROMBECTOMY Right    RLE    There were no vitals filed for this visit.   Subjective Assessment - 01/12/21 0934     Subjective Pt states that he feels like his back is "sore" from his exercises. He also states he did some work under his house so that might have irritated his back    Pertinent History Blood clots R LE    Patient Stated Goals would like to be out of pain, not limited by pain    Currently in Pain? Yes    Pain Score 3     Pain Location Back    Pain Orientation Lower    Pain Descriptors / Indicators Sore    Pain Type Chronic pain                [facility] PT Assessment - 01/12/21 0001       Assessment   Medical Diagnosis Lumbar DDD    Referring Provider (PT) Gavin Pound, MD    Hand Dominance Right    Next MD Visit ~6 months    Prior Therapy R frozen shoulder, L lateral epicondylitis                           [facility] Adult PT Treatment/Exercise - 01/12/21 0001        Exercises   Exercises Lumbar      Lumbar Exercises: Stretches   Passive Hamstring Stretch Right;Left;2 reps;30 seconds    Double Knee to Chest Stretch 3 reps;20 seconds    Lower Trunk Rotation 5 reps;10 seconds    Figure 4 Stretch 2 reps;30 seconds      Lumbar Exercises: Aerobic   Tread Mill 2.49mph x 5 mins      Lumbar Exercises: Supine   Pelvic Tilt 10 reps    Pelvic Tilt Limitations initially requires tactile cues for technique    Clam 10 reps    Clam Limitations red TB    Bridge 10 reps    Bridge with clamshell 10 reps    Bridge with [employer] Limitations red TB      Manual Therapy   Manual Therapy Soft tissue mobilization    Soft tissue mobilization STM lumbar paraspinals and piriformis  PT Short Term Goals - 01/06/21 1834       PT SHORT TERM GOAL #1   Title Pt will be I and compliant with initial HEP.    Time 3    Period Weeks    Status New    Target Date 01/27/21               PT Long Term Goals - 01/06/21 1835       PT LONG TERM GOAL #1   Title Pt will be independent with long term HEP for maintenance.    Time 8    Period Weeks    Status New    Target Date 03/03/21      PT LONG TERM GOAL #2   Title Pt will improve lumbar AROM to [facility] without pain.    Baseline see flowsheet    Time 8    Period Weeks    Status New    Target Date 03/03/21      PT LONG TERM GOAL #3   Title Pt will improve FOT      PT LONG TERM GOAL #4   Title Pt will be able to perform IADLs and yard work with <3/10 pain with proper body mechanics.    Baseline painful    Time 8    Period Weeks    Status New    Target Date 03/03/21      PT LONG TERM GOAL #5   Title Pt will lift 25# from floor to waist and 10# overhead with proper body mechanics and </=3/10 back pain.    Baseline pt has pain lifting groceries from floor    Time 8    Period Weeks    Status New    Target Date 03/03/21                   Plan - 01/12/21 1010      Clinical Impression Statement Pt responds well to progression of core stabilization exercises and addition of hip mobility and stretches. Pt with some TTP with manual therapy which eases by end of session    PT Next Visit Plan progress core strength, progress HEP    PT Home Exercise Plan [place] and Agree with Plan of Care Patient             Patient will benefit from skilled therapeutic intervention in order to improve the following deficits and impairments:     Visit Diagnosis: Chronic bilateral low back pain without sciatica  Abnormal posture  Other symptoms and signs involving the musculoskeletal system     Problem List Patient Active Problem List   Diagnosis Date Noted   Psoriatic arthritis ([place]) 04/14/2019   Prediabetes 04/14/2019   Factor V Leiden mutation ([place]) 04/07/2017   Deep vein thrombosis (DVT) of proximal vein of right lower extremity ([place]) 03/01/2017   Elevated PSA 06/28/2016   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

## 2021-01-19 ENCOUNTER — Encounter: Payer: Self-pay | Admitting: Physical Therapy

## 2021-01-19 ENCOUNTER — Ambulatory Visit: Payer: Medicare PPO | Admitting: Physical Therapy

## 2021-01-19 ENCOUNTER — Other Ambulatory Visit: Payer: Self-pay

## 2021-01-19 DIAGNOSIS — R29898 Other symptoms and signs involving the musculoskeletal system: Secondary | ICD-10-CM | POA: Diagnosis not present

## 2021-01-19 DIAGNOSIS — R293 Abnormal posture: Secondary | ICD-10-CM | POA: Diagnosis not present

## 2021-01-19 DIAGNOSIS — M545 Low back pain, unspecified: Secondary | ICD-10-CM | POA: Diagnosis not present

## 2021-01-19 DIAGNOSIS — G8929 Other chronic pain: Secondary | ICD-10-CM

## 2021-01-19 NOTE — Patient Instructions (Signed)
Access Code: [email or web address] URL: [email or web address] Date: 01/19/2021 Prepared by: Glenetta Hew  Exercises Sidelying Thoracic Rotation with Open Book - 1-2 x daily - 7 x weekly - 2 sets - 10 reps Supine Lower Trunk Rotation - 1 x daily - 7 x weekly - 1 sets - 10 reps - 3-5 seconds or more if you like hold Supine Posterior Pelvic Tilt - 2 x daily - 7 x weekly - 2 sets - 10 reps - 5 seconds hold Supine Double Knee to Chest - 1 x daily - 7 x weekly - 3 sets - 10-20 seconds hold Seated Piriformis Stretch - 1 x daily - 7 x weekly - 3 sets - 10 reps * Supine Figure 4 Piriformis Stretch - 1 x daily - 7 x weekly - 3 sets - 10 reps * Clamshell with Resistance - 1 x daily - 7 x weekly - 3 sets - 10 reps * Supine Bridge - 1 x daily - 7 x weekly - 3 sets - 10 reps *  *added today

## 2021-01-19 NOTE — Therapy (Signed)
[facility] [address]  [place], [place], [address] Phone: [phone]   Fax:  [phone]  Physical Therapy Treatment  Patient Details  Name: Randall Scott MRN: [id number] Date of Birth: 07-01-1951 Referring Provider (PT): Gavin Pound, MD   Encounter Date: 01/19/2021   PT End of Session - 01/19/21 0934     Visit Number 3    Number of Visits 16    Date for PT Re-Evaluation 03/03/21    Authorization Type [employer]    Progress Note Due on Visit 10    PT Start Time 0930    PT Stop Time 1015    PT Time Calculation (min) 45 min    Activity Tolerance Patient tolerated treatment well    Behavior During Therapy [facility] for tasks assessed/performed             Past Medical History:  Diagnosis Date   Allergy    Arthritis    Asthma    Cataract     Past Surgical History:  Procedure Laterality Date   EYE SURGERY     HERNIA REPAIR     PERIPHERAL VASCULAR THROMBECTOMY Right    RLE    There were no vitals filed for this visit.   Subjective Assessment - 01/19/21 0932     Subjective Pt reports back is "not too bad" today, but hurts every day. Compliant with HEP    Pertinent History Blood clots R LE    Limitations Sitting;Standing;Lifting    How long can you sit comfortably? when it's already hurting, it hurts more with prolonged sitting    How long can you stand comfortably? 15-20 minutes    How long can you walk comfortably? 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-3    Patient Stated Goals would like to be out of pain, not limited by pain    Pain Score 4     Pain Location Back    Pain Descriptors / Indicators Aching    Pain Onset More than a month ago                               [facility] Adult PT Treatment/Exercise - 01/19/21 0001       Exercises   Exercises Lumbar      Lumbar Exercises: Stretches   Figure  4 Stretch 3 reps;30 seconds      Lumbar Exercises: Aerobic   Nustep L5 x 6 min      Lumbar Exercises: Supine   Pelvic Tilt 10 reps    Pelvic Tilt Limitations progressed 10 reps with ball squeeze, 10 reps with clamshell RTB    Clam --    Clam Limitations --    Bridge with [employer] 10 reps    Bridge with clamshell --      Lumbar Exercises: Sidelying   Clam 20 reps    Clam Limitations RTB, cues to not rotate      Manual Therapy   Manual Therapy Joint mobilization;Soft tissue mobilization    Joint Mobilization UPA mobs to R L2-5    Soft tissue mobilization STM lumbar paraspinals, QL and piriformis                    PT Education - 01/19/21 1026     Education Details progressed HEP, recommendations on  positioning, if on floor place pillows under hips or perform in bed, can choose between seated or supine piriformis stretch    Person(s) Educated Patient    Methods Explanation;Demonstration;Tactile cues    Comprehension Verbalized understanding              PT Short Term Goals - 01/06/21 1834       PT SHORT TERM GOAL #1   Title Pt will be I and compliant with initial HEP.    Time 3    Period Weeks    Status New    Target Date 01/27/21               PT Long Term Goals - 01/06/21 1835       PT LONG TERM GOAL #1   Title Pt will be independent with long term HEP for maintenance.    Time 8    Period Weeks    Status New    Target Date 03/03/21      PT LONG TERM GOAL #2   Title Pt will improve lumbar AROM to [facility] without pain.    Baseline see flowsheet    Time 8    Period Weeks    Status New    Target Date 03/03/21      PT LONG TERM GOAL #3   Title Pt will improve FOT      PT LONG TERM GOAL #4   Title Pt will be able to perform IADLs and yard work with <3/10 pain with proper body mechanics.    Baseline painful    Time 8    Period Weeks    Status New    Target Date 03/03/21      PT LONG TERM GOAL #5   Title Pt will lift 25# from floor  to waist and 10# overhead with proper body mechanics and </=3/10 back pain.    Baseline pt has pain lifting groceries from floor    Time 8    Period Weeks    Status New    Target Date 03/03/21                   Plan - 01/19/21 0935     Clinical Impression Statement Pt. participated well throughout session, reporting some discomfort in R side/hip with some of the exercises but no pain, reported decreased tightness following interventions.  Progressed HEP and answered all questions.    Personal Factors and Comorbidities Age;Past/Current Experience;Time since onset of injury/illness/exacerbation;Fitness;Comorbidity 2    Comorbidities Arthritis, asthma    Examination-Activity Limitations Squat;Stand;Sit;Bend;Lift    Examination-Participation Restrictions Yard Work;Community Activity;Cleaning    Stability/Clinical Decision Making Stable/Uncomplicated    Rehab Potential Good    PT Frequency --   1-2x/week   PT Duration 8 weeks    PT Treatment/Interventions ADLs/Self Care Home Management;Aquatic Therapy;Cryotherapy;Electrical Stimulation;Iontophoresis 4mg /ml Dexamethasone;Traction;Moist Heat;Gait training;Stair training;Functional mobility training;Therapeutic activities;Therapeutic exercise;Balance training;Neuromuscular re-education;Patient/family education;Manual techniques;Passive range of motion;Dry needling;Taping;Spinal Manipulations;Joint Manipulations    PT Next Visit Plan Assess response to HEP/update PRN, initiate hip flexibility, lumbar mobility, core and hip stab    PT Home Exercise [place] (updated 01/19/21)    Consulted and Agree with Plan of Care Patient             Patient will benefit from skilled therapeutic intervention in order to improve the following deficits and impairments:  Hypomobility, Improper body mechanics, Impaired perceived functional ability, Decreased range of motion, Decreased activity tolerance, Decreased strength, Increased fascial  restricitons, Impaired  flexibility, Postural dysfunction, Pain  Visit Diagnosis: Chronic bilateral low back pain without sciatica  Abnormal posture  Other symptoms and signs involving the musculoskeletal system     Problem List Patient Active Problem List   Diagnosis Date Noted   Psoriatic arthritis ([place]) 04/14/2019   Prediabetes 04/14/2019   Factor V Leiden mutation ([place]) 04/07/2017   Deep vein thrombosis (DVT) of proximal vein of right lower extremity ([place]) 03/01/2017   Elevated PSA 06/28/2016   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

## 2021-01-21 ENCOUNTER — Ambulatory Visit: Payer: Medicare PPO | Admitting: Physical Therapy

## 2021-01-21 ENCOUNTER — Other Ambulatory Visit: Payer: Self-pay

## 2021-01-21 ENCOUNTER — Encounter: Payer: Self-pay | Admitting: Physical Therapy

## 2021-01-21 DIAGNOSIS — G8929 Other chronic pain: Secondary | ICD-10-CM | POA: Diagnosis not present

## 2021-01-21 DIAGNOSIS — R293 Abnormal posture: Secondary | ICD-10-CM

## 2021-01-21 DIAGNOSIS — R29898 Other symptoms and signs involving the musculoskeletal system: Secondary | ICD-10-CM

## 2021-01-21 DIAGNOSIS — M545 Low back pain, unspecified: Secondary | ICD-10-CM | POA: Diagnosis not present

## 2021-01-21 NOTE — Therapy (Signed)
[facility] [address]  [place], [place], [address] Phone: [phone]   Fax:  [phone]  Physical Therapy Treatment  Patient Details  Name: BUD ZILINSKI MRN: [id number] Date of Birth: Jan 26, 1951 Referring Provider (PT): Gavin Pound, MD   Encounter Date: 01/21/2021   PT End of Session - 01/21/21 1008     Visit Number 4    Number of Visits 16    Date for PT Re-Evaluation 03/03/21    Authorization Type [employer]    Authorization - Visit Number 2    Progress Note Due on Visit 10    PT Start Time 0924    PT Stop Time 1005    PT Time Calculation (min) 41 min    Activity Tolerance Patient tolerated treatment well    Behavior During Therapy [facility] for tasks assessed/performed             Past Medical History:  Diagnosis Date   Allergy    Arthritis    Asthma    Cataract     Past Surgical History:  Procedure Laterality Date   EYE SURGERY     HERNIA REPAIR     PERIPHERAL VASCULAR THROMBECTOMY Right    RLE    There were no vitals filed for this visit.   Subjective Assessment - 01/21/21 0924     Subjective Pt reports he is doing well, he has pain everyday.    Pain Score 3     Pain Location Back    Aggravating Factors  standing, bending, lifting, prolong sitting    Pain Relieving Factors stretching, ice                               [facility] Adult PT Treatment/Exercise - 01/21/21 0001       Lumbar Exercises: Aerobic   Nustep L5x46mn      Lumbar Exercises: Seated   Sit to Stand 20 reps    Sit to Stand Limitations yellow ball OHP 1 set    Other Seated Lumbar Exercises Abd bracing orange pball 2x10    Other Seated Lumbar Exercises shoulder ext and rows red tband 2x10      Lumbar Exercises: Supine   Bridge 20 reps;Non-compliant    Bridge Limitations 1 set with feet on pball    Other Supine Lumbar Exercises marching red tband 2x10   cues for keeping pelvic tilt and  breathing     Lumbar Exercises: Sidelying   Clam 20 reps    Clam Limitations RTB, cues to not rotate      Manual Therapy   Manual Therapy Passive ROM    Passive ROM B LE                      PT Short Term Goals - 01/21/21 1008       PT SHORT TERM GOAL #1   Title Pt will be I and compliant with initial HEP.    Time 3    Period Weeks    Status On-going    Target Date 01/27/21               PT Long Term Goals - 01/06/21 1835       PT LONG TERM GOAL #1   Title Pt will be independent with long term HEP for maintenance.    Time 8    Period Weeks  Status New    Target Date 03/03/21      PT LONG TERM GOAL #2   Title Pt will improve lumbar AROM to [facility] without pain.    Baseline see flowsheet    Time 8    Period Weeks    Status New    Target Date 03/03/21      PT LONG TERM GOAL #3   Title Pt will improve FOT      PT LONG TERM GOAL #4   Title Pt will be able to perform IADLs and yard work with <3/10 pain with proper body mechanics.    Baseline painful    Time 8    Period Weeks    Status New    Target Date 03/03/21      PT LONG TERM GOAL #5   Title Pt will lift 25# from floor to waist and 10# overhead with proper body mechanics and </=3/10 back pain.    Baseline pt has pain lifting groceries from floor    Time 8    Period Weeks    Status New    Target Date 03/03/21                   Plan - 01/21/21 1006     Clinical Impression Statement Pt tolerated all exercises well with no increases in pain. Continued to progress core strengthening. Began some UE strengthening. PROM done to increased ROM, noted hamstring, ITB, piriformis tightness B.    PT Treatment/Interventions ADLs/Self Care Home Management;Aquatic Therapy;Cryotherapy;Electrical Stimulation;Iontophoresis '4mg'$ /ml Dexamethasone;Traction;Moist Heat;Gait training;Stair training;Functional mobility training;Therapeutic activities;Therapeutic exercise;Balance training;Neuromuscular  re-education;Patient/family education;Manual techniques;Passive range of motion;Dry needling;Taping;Spinal Manipulations;Joint Manipulations    PT Next Visit Plan Assess response to HEP/update PRN, initiate hip flexibility, lumbar mobility, core and hip stab    PT Home Exercise [place] (updated 01/19/21)             Patient will benefit from skilled therapeutic intervention in order to improve the following deficits and impairments:  Hypomobility, Improper body mechanics, Impaired perceived functional ability, Decreased range of motion, Decreased activity tolerance, Decreased strength, Increased fascial restricitons, Impaired flexibility, Postural dysfunction, Pain  Visit Diagnosis: Chronic bilateral low back pain without sciatica  Abnormal posture  Other symptoms and signs involving the musculoskeletal system     Problem List Patient Active Problem List   Diagnosis Date Noted   Psoriatic arthritis ([place]) 04/14/2019   Prediabetes 04/14/2019   Factor V Leiden mutation ([place]) 04/07/2017   Deep vein thrombosis (DVT) of proximal vein of right lower extremity ([place]) 03/01/2017   Elevated PSA 06/28/2016   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  [place], [place], [address] Phone: [phone]   Fax:  [phone]  Name: JAYON LEFEBURE MRN: [id number] Date of Birth: May 27, 1951

## 2021-01-26 ENCOUNTER — Other Ambulatory Visit: Payer: Self-pay

## 2021-01-26 ENCOUNTER — Ambulatory Visit: Payer: Medicare PPO

## 2021-01-26 DIAGNOSIS — R293 Abnormal posture: Secondary | ICD-10-CM

## 2021-01-26 DIAGNOSIS — R29898 Other symptoms and signs involving the musculoskeletal system: Secondary | ICD-10-CM

## 2021-01-26 DIAGNOSIS — G8929 Other chronic pain: Secondary | ICD-10-CM

## 2021-01-26 DIAGNOSIS — M545 Low back pain, unspecified: Secondary | ICD-10-CM

## 2021-01-26 NOTE — Therapy (Signed)
[facility] [address]  [place], [place], [address] Phone: [phone]   Fax:  [phone]  Physical Therapy Treatment  Patient Details  Name: Randall Scott MRN: [id number] Date of Birth: [birth year] Referring Provider (PT): Gavin Pound, MD   Encounter Date: 01/26/2021   PT End of Session - 01/26/21 1014     Visit Number 5    Number of Visits 16    Date for PT Re-Evaluation 03/03/21    Authorization Type [employer]    Authorization - Visit Number 3    Progress Note Due on Visit 10    PT Start Time 0932    PT Stop Time 1014    PT Time Calculation (min) 42 min    Activity Tolerance Patient tolerated treatment well    Behavior During Therapy [facility] for tasks assessed/performed             Past Medical History:  Diagnosis Date   Allergy    Arthritis    Asthma    Cataract     Past Surgical History:  Procedure Laterality Date   EYE SURGERY     HERNIA REPAIR     PERIPHERAL VASCULAR THROMBECTOMY Right    RLE    There were no vitals filed for this visit.   Subjective Assessment - 01/26/21 0934     Subjective "Always stiff in the am". HEP going well.    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 No/denies                               [facility] Adult PT Treatment/Exercise - 01/26/21 0001       Exercises   Exercises Lumbar      Lumbar Exercises: Stretches   Passive Hamstring Stretch Right;30 seconds    Single Knee to Chest Stretch Right;30 seconds    Single Knee to Chest Stretch Limitations passive    Standing Extension 5 reps;5 seconds      Lumbar Exercises: Aerobic   Nustep L5x74mn      Lumbar Exercises: Machines for Strengthening   Other Lumbar Machine Exercise standing lat pulldown  2x10, 15#      Lumbar Exercises: Standing   Other Standing Lumbar Exercises L trunk rotation with red TB 10x    Other Standing Lumbar Exercises pallof press with green TB 10 reps      Lumbar Exercises: Seated   Other Seated Lumbar Exercises seated orange pball rollout 3 way 10x5"      Lumbar Exercises: Supine   Bridge with Ball Squeeze 10 reps;3 seconds                      PT Short Term Goals - 01/26/21 1009       PT SHORT TERM GOAL #1   Title Pt will be I and compliant with initial HEP.    Time 3    Period Weeks    Status Achieved    Target Date 01/27/21               PT Long Term Goals - 01/26/21 1010       PT LONG TERM GOAL #1  Title Pt will be independent with long term HEP for maintenance.    Time 8    Period Weeks    Status On-going      PT LONG TERM GOAL #2   Title Pt will improve lumbar AROM to [facility] without pain.    Baseline see flowsheet    Time 8    Period Weeks    Status On-going      PT LONG TERM GOAL #3   Title Pt will improve FOT    Status On-going      PT LONG TERM GOAL #4   Title Pt will be able to perform IADLs and yard work with <3/10 pain with proper body mechanics.    Baseline painful    Time 8    Period Weeks    Status On-going      PT LONG TERM GOAL #5   Title Pt will lift 25# from floor to waist and 10# overhead with proper body mechanics and </=3/10 back pain.    Baseline pt has pain lifting groceries from floor    Time 8    Period Weeks    Status On-going                   Plan - 01/26/21 1017     Clinical Impression Statement Pt denies questions/concerns with HEP and remains compliant. He noted that his back remains stiff in the am and exercises help to get him going. He did report some pulling in the R LB with L side bend with pball but relieved with R side bend. Progressed core stab exercises and mobility exercises. Pt reported no pain with exercises. He would benefit from more hip stretches and  trunk mobility exercises to reduce stiffness.    Personal Factors and Comorbidities Age;Past/Current Experience;Time since onset of injury/illness/exacerbation;Fitness;Comorbidity 2    Comorbidities Arthritis, asthma    PT Frequency Other (comment)   1-2x per week   PT Duration 8 weeks    PT Treatment/Interventions ADLs/Self Care Home Management;Aquatic Therapy;Cryotherapy;Electrical Stimulation;Iontophoresis '4mg'$ /ml Dexamethasone;Traction;Moist Heat;Gait training;Stair training;Functional mobility training;Therapeutic activities;Therapeutic exercise;Balance training;Neuromuscular re-education;Patient/family education;Manual techniques;Passive range of motion;Dry needling;Taping;Spinal Manipulations;Joint Manipulations    PT Next Visit Plan Assess response to HEP/update PRN, initiate hip flexibility, lumbar mobility, core and hip stab    PT [place] (updated 01/19/21)    Consulted and Agree with Plan of Care Patient             Patient will benefit from skilled therapeutic intervention in order to improve the following deficits and impairments:  Hypomobility, Improper body mechanics, Impaired perceived functional ability, Decreased range of motion, Decreased activity tolerance, Decreased strength, Increased fascial restricitons, Impaired flexibility, Postural dysfunction, Pain  Visit Diagnosis: Chronic bilateral low back pain without sciatica  Abnormal posture  Other symptoms and signs involving the musculoskeletal system     Problem List Patient Active Problem List   Diagnosis Date Noted   Psoriatic arthritis ([place]) 04/14/2019   Prediabetes 04/14/2019   Factor V Leiden mutation ([place]) 04/07/2017   Deep vein thrombosis (DVT) of proximal vein of right lower extremity ([place]) 03/01/2017   Elevated PSA 06/28/2016   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

## 2021-01-28 ENCOUNTER — Other Ambulatory Visit: Payer: Self-pay

## 2021-01-28 ENCOUNTER — Ambulatory Visit: Payer: Medicare PPO

## 2021-01-28 DIAGNOSIS — R29898 Other symptoms and signs involving the musculoskeletal system: Secondary | ICD-10-CM | POA: Diagnosis not present

## 2021-01-28 DIAGNOSIS — G8929 Other chronic pain: Secondary | ICD-10-CM

## 2021-01-28 DIAGNOSIS — M545 Low back pain, unspecified: Secondary | ICD-10-CM | POA: Diagnosis not present

## 2021-01-28 DIAGNOSIS — R293 Abnormal posture: Secondary | ICD-10-CM | POA: Diagnosis not present

## 2021-01-28 NOTE — Therapy (Signed)
[facility] [address]  [place], [place], [address] Phone: [phone]   Fax:  [phone]  Physical Therapy Treatment  Patient Details  Name: Randall Scott MRN: [id number] Date of Birth: [birth year] Referring Provider (PT): Gavin Pound, MD   Encounter Date: 01/28/2021   PT End of Session - 01/28/21 1014     Visit Number 6    Number of Visits 16    Date for PT Re-Evaluation 03/03/21    Authorization Type [employer]    Authorization - Visit Number 6   corrected from last visit   Authorization - Number of Visits 16    Progress Note Due on Visit 10    PT Start Time 0926    PT Stop Time 1012    PT Time Calculation (min) 46 min    Activity Tolerance Patient tolerated treatment well    Behavior During Therapy [facility] for tasks assessed/performed             Past Medical History:  Diagnosis Date   Allergy    Arthritis    Asthma    Cataract     Past Surgical History:  Procedure Laterality Date   EYE SURGERY     HERNIA REPAIR     PERIPHERAL VASCULAR THROMBECTOMY Right    RLE    There were no vitals filed for this visit.   Subjective Assessment - 01/28/21 0928     Subjective Back is sore from doing yard work yesterday.    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 No/denies   "soreness"                              [facility] Adult PT Treatment/Exercise - 01/28/21 0001       Lumbar Exercises: Stretches   Lower Trunk Rotation Limitations 10x5"      Lumbar Exercises: Aerobic   Nustep L5x49mn      Lumbar Exercises: Standing   Shoulder Extension Strengthening;Both;10 reps;Theraband    Theraband Level (Shoulder Extension) Level 3 (Green)    Other Standing Lumbar Exercises R/L trunk rotation with  green TB 10 reps   cues to isolate upper trunk from lower trunk   Other Standing Lumbar Exercises multifidus walkout with CCW circles 2x10; green TB      Lumbar Exercises: Seated   Other Seated Lumbar Exercises seated orange pball rollout fwd and left rotation 10x5"      Lumbar Exercises: Supine   Bridge with Ball Squeeze 10 reps;3 seconds    Straight Leg Raise 10 reps;3 seconds    Straight Leg Raises Limitations with QS      Lumbar Exercises: Sidelying   Other Sidelying Lumbar Exercises R open book 10x5"                      PT Short Term Goals - 01/26/21 1009       PT SHORT TERM GOAL #1   Title Pt will be I and compliant with initial HEP.    Time 3    Period Weeks    Status Achieved    Target Date 01/27/21  PT Long Term Goals - 01/28/21 1009       PT LONG TERM GOAL #1   Title Pt will be independent with long term HEP for maintenance.    Time 8    Period Weeks    Status On-going      PT LONG TERM GOAL #2   Title Pt will improve lumbar AROM to [facility] without pain.    Baseline see flowsheet    Time 8    Period Weeks    Status On-going      PT LONG TERM GOAL #3   Title Pt will improve FOT    Status On-going      PT LONG TERM GOAL #4   Title Pt will be able to perform IADLs and yard work with <3/10 pain with proper body mechanics.    Baseline painful    Time 8    Period Weeks    Status Partially Met   no pain with yard work, sore after; need to work on [place] #5   Title Pt will lift 25# from floor to waist and 10# overhead with proper body mechanics and </=3/10 back pain.    Baseline pt has pain lifting groceries from floor    Time 8    Period Weeks    Status On-going                   Plan - 01/28/21 1016     Clinical Impression Statement Pt responded well to treatment. Still considerably tight with trunk rotation more on the R side. Updated HEP progressing hip strengthening exercises and scap stab.  Pt requires cues with trunk rotation to isolate upper body from lower body. He noted that he was able to do yard work yesterday w/o pain but was sore afterward. In future visits we may need to review body mechanics with lifiting.    Personal Factors and Comorbidities Age;Past/Current Experience;Time since onset of injury/illness/exacerbation;Fitness;Comorbidity 2    Comorbidities Arthritis, asthma    PT Frequency Other (comment)    PT Duration 8 weeks    PT Treatment/Interventions ADLs/Self Care Home Management;Aquatic Therapy;Cryotherapy;Electrical Stimulation;Iontophoresis 58m/ml Dexamethasone;Traction;Moist Heat;Gait training;Stair training;Functional mobility training;Therapeutic activities;Therapeutic exercise;Balance training;Neuromuscular re-education;Patient/family education;Manual techniques;Passive range of motion;Dry needling;Taping;Spinal Manipulations;Joint Manipulations    PT Next Visit Plan Assess response to HEP/update PRN, hip flexibility, lumbar mobility focusing on rotation, core and hip stab    PT [place](updated 01/19/21)    Consulted and Agree with Plan of Care Patient             Patient will benefit from skilled therapeutic intervention in order to improve the following deficits and impairments:  Hypomobility, Improper body mechanics, Impaired perceived functional ability, Decreased range of motion, Decreased activity tolerance, Decreased strength, Increased fascial restricitons, Impaired flexibility, Postural dysfunction, Pain  Visit Diagnosis: Chronic bilateral low back pain without sciatica  Abnormal posture  Other symptoms and signs involving the musculoskeletal system     Problem List Patient Active Problem List   Diagnosis Date Noted   Psoriatic arthritis ([place] 04/14/2019   Prediabetes 04/14/2019   Factor V Leiden mutation ([place] 04/07/2017   Deep vein thrombosis (DVT) of proximal vein of right lower extremity ([place] 03/01/2017    Elevated PSA 06/28/2016   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  [place], [place], [address] Phone: [phone]   Fax:  [phone]  Name: Randall Scott MRN: [id number] Date of Birth: 02-Aug-1950

## 2021-02-02 ENCOUNTER — Other Ambulatory Visit: Payer: Self-pay

## 2021-02-02 ENCOUNTER — Ambulatory Visit: Payer: Medicare PPO | Attending: Rheumatology

## 2021-02-02 DIAGNOSIS — R29898 Other symptoms and signs involving the musculoskeletal system: Secondary | ICD-10-CM

## 2021-02-02 DIAGNOSIS — M545 Low back pain, unspecified: Secondary | ICD-10-CM | POA: Diagnosis not present

## 2021-02-02 DIAGNOSIS — G8929 Other chronic pain: Secondary | ICD-10-CM | POA: Diagnosis not present

## 2021-02-02 DIAGNOSIS — R293 Abnormal posture: Secondary | ICD-10-CM | POA: Diagnosis not present

## 2021-02-02 NOTE — Therapy (Signed)
[facility] [address]  [place], [place], [address] Phone: [phone]   Fax:  [phone]  Physical Therapy Treatment  Patient Details  Name: Randall Scott MRN: [id number] Date of Birth: [birth year] Referring Provider (PT): Gavin Pound, MD   Encounter Date: 02/02/2021   PT End of Session - 02/02/21 1110     Visit Number 7    Number of Visits 16    Date for PT Re-Evaluation 03/03/21    Authorization Type [employer]    Authorization - Visit Number 7    Authorization - Number of Visits 16    Progress Note Due on Visit 10    PT Start Time 1020    PT Stop Time 1104    PT Time Calculation (min) 44 min    Activity Tolerance Patient tolerated treatment well    Behavior During Therapy [facility] for tasks assessed/performed             Past Medical History:  Diagnosis Date   Allergy    Arthritis    Asthma    Cataract     Past Surgical History:  Procedure Laterality Date   EYE SURGERY     HERNIA REPAIR     PERIPHERAL VASCULAR THROMBECTOMY Right    RLE    There were no vitals filed for this visit.   Subjective Assessment - 02/02/21 1025     Subjective Back is still stiff in the am.    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 No/denies                [facility] PT Assessment - 02/02/21 0001       AROM   Lumbar Flexion hands down to distal tibia    Lumbar Extension 50% limited    Lumbar - Right Side Bend hands down to proximal femur    Lumbar - Left Side Bend hands down to proximal femur    Lumbar - Right Rotation ~30% limited    Lumbar - Left Rotation 25% limited                           [facility] Adult PT Treatment/Exercise - 02/02/21 0001       Exercises   Exercises Lumbar      Lumbar  Exercises: Stretches   Single Knee to Chest Stretch Right;30 seconds;2 reps    Single Knee to Chest Stretch Limitations passive    Piriformis Stretch Right;30 seconds;2 reps    Piriformis Stretch Limitations passive      Lumbar Exercises: Aerobic   Recumbent Bike L2x56mn      Lumbar Exercises: Sidelying   Clam Right;10 reps    Clam Limitations reverse clams, 2# weight    Hip Abduction Right;Weights;15 reps    Hip Abduction Weights (lbs) 2    Other Sidelying Lumbar Exercises R fire hydrants 10x3"      Manual Therapy   Manual Therapy Soft tissue mobilization    Soft tissue mobilization STM to lumbar paraspinals, QL and piriformis   twitch response in piriformis                     PT Short Term Goals -  01/26/21 1009       PT SHORT TERM GOAL #1   Title Pt will be I and compliant with initial HEP.    Time 3    Period Weeks    Status Achieved    Target Date 01/27/21               PT Long Term Goals - 02/02/21 1033       PT LONG TERM GOAL #1   Title Pt will be independent with long term HEP for maintenance.    Time 8    Period Weeks    Status On-going      PT LONG TERM GOAL #2   Title Pt will improve lumbar AROM to [facility] without pain.    Baseline see flowsheet    Time 8    Period Weeks    Status On-going   tightness with all motions and soft tissue restriction     PT LONG TERM GOAL #3   Title Pt will improve FOT    Status On-going      PT LONG TERM GOAL #4   Title Pt will be able to perform IADLs and yard work with <3/10 pain with proper body mechanics.    Baseline painful    Time 8    Period Weeks    Status Partially Met   no pain with yard work, sore after; need to work on [place] #5   Title Pt will lift 25# from floor to waist and 10# overhead with proper body mechanics and </=3/10 back pain.    Baseline pt has pain lifting groceries from floor    Time 8    Period Weeks    Status On-going                    Plan - 02/02/21 1117     Clinical Impression Statement Pt still demonstrating stiffness with all lumbar motions. Due to soft tissue restrictions, we continued to STM to the low back musculature and glutes. Pt demonstrated the most ttp in the piriformis area and noted that area gives him the most pain when walking for long periods of time. Followed up STM with manual stretches for the glutes and strengthening exercises. Most muscular effort shown with resisted hip abduction in S/L. He reported that the massage and stretches helped him move much better post session. Pt responded well.    Personal Factors and Comorbidities Age;Past/Current Experience;Time since onset of injury/illness/exacerbation;Fitness;Comorbidity 2    Comorbidities Arthritis, asthma    PT Frequency Other (comment)    PT Duration 8 weeks    PT Treatment/Interventions ADLs/Self Care Home Management;Aquatic Therapy;Cryotherapy;Electrical Stimulation;Iontophoresis 31m/ml Dexamethasone;Traction;Moist Heat;Gait training;Stair training;Functional mobility training;Therapeutic activities;Therapeutic exercise;Balance training;Neuromuscular re-education;Patient/family education;Manual techniques;Passive range of motion;Dry needling;Taping;Spinal Manipulations;Joint Manipulations    PT Next Visit Plan Assess response to HEP/update PRN, hip flexibility, lumbar mobility focusing on rotation, core and hip stab    PT Home Exercise [place](updated 01/19/21)    Consulted and Agree with Plan of Care Patient             Patient will benefit from skilled therapeutic intervention in order to improve the following deficits and impairments:  Hypomobility, Improper body mechanics, Impaired perceived functional ability, Decreased range of motion, Decreased activity tolerance, Decreased strength, Increased fascial restricitons, Impaired flexibility, Postural dysfunction, Pain  Visit Diagnosis: Chronic bilateral low back pain without  sciatica  Abnormal posture  Other symptoms and  signs involving the musculoskeletal system     Problem List Patient Active Problem List   Diagnosis Date Noted   Psoriatic arthritis ([place]) 04/14/2019   Prediabetes 04/14/2019   Factor V Leiden mutation ([place]) 04/07/2017   Deep vein thrombosis (DVT) of proximal vein of right lower extremity ([place]) 03/01/2017   Elevated PSA 06/28/2016   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  [place], [place], [address] Phone: [phone]   Fax:  [phone]  Name: Randall Scott MRN: [id number] Date of Birth: 02/07/1951

## 2021-02-04 ENCOUNTER — Ambulatory Visit: Payer: Medicare PPO

## 2021-02-04 ENCOUNTER — Other Ambulatory Visit: Payer: Self-pay

## 2021-02-04 DIAGNOSIS — G8929 Other chronic pain: Secondary | ICD-10-CM

## 2021-02-04 DIAGNOSIS — R29898 Other symptoms and signs involving the musculoskeletal system: Secondary | ICD-10-CM

## 2021-02-04 DIAGNOSIS — M545 Low back pain, unspecified: Secondary | ICD-10-CM

## 2021-02-04 DIAGNOSIS — R293 Abnormal posture: Secondary | ICD-10-CM

## 2021-02-04 NOTE — Therapy (Signed)
[facility] [address]  [place], [place], [address] Phone: [phone]   Fax:  [phone]  Physical Therapy Treatment  Patient Details  Name: Randall Scott MRN: [id number] Date of Birth: 1951/02/05 Referring Provider (PT): Gavin Pound, MD   Encounter Date: 02/04/2021   PT End of Session - 02/04/21 1019     Visit Number 8    Number of Visits 16    Date for PT Re-Evaluation 03/03/21    Authorization Type [employer]    Authorization - Visit Number 8    Authorization - Number of Visits 16    Progress Note Due on Visit 10    PT Start Time 0933    PT Stop Time 1016    PT Time Calculation (min) 43 min    Activity Tolerance Patient tolerated treatment well    Behavior During Therapy [facility] for tasks assessed/performed             Past Medical History:  Diagnosis Date   Allergy    Arthritis    Asthma    Cataract     Past Surgical History:  Procedure Laterality Date   EYE SURGERY     HERNIA REPAIR     PERIPHERAL VASCULAR THROMBECTOMY Right    RLE    There were no vitals filed for this visit.   Subjective Assessment - 02/04/21 0937     Subjective A little stiff from yard work yesterday.    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 No/denies                               [facility] Adult PT Treatment/Exercise - 02/04/21 0001       Exercises   Exercises Lumbar;Knee/Hip      Lumbar Exercises: Aerobic   Nustep L5x42min      Lumbar Exercises: Standing   Row Strengthening;Both;15 reps    Row Limitations TRX row    Other Standing Lumbar Exercises simulating weed eating with stick and 3# cuff weight. Instructed to keep nuetral spine and hinge hips, 3x10 feet      Lumbar Exercises: Seated   Other  Seated Lumbar Exercises shoulder flexion with red TB 10 reps    Other Seated Lumbar Exercises chest press and trunk rotation with orange weight ball 6 reps each      Knee/Hip Exercises: Standing   Other Standing Knee Exercises monster walk, side steps with red TB 3x10 feet                      PT Short Term Goals - 01/26/21 1009       PT SHORT TERM GOAL #1   Title Pt will be I and compliant with initial HEP.    Time 3    Period Weeks    Status Achieved    Target Date 01/27/21               PT Long Term Goals - 02/02/21 1033       PT LONG TERM GOAL #1   Title Pt will be independent with long term HEP for maintenance.    Time 8  Period Weeks    Status On-going      PT LONG TERM GOAL #2   Title Pt will improve lumbar AROM to [facility] without pain.    Baseline see flowsheet    Time 8    Period Weeks    Status On-going   tightness with all motions and soft tissue restriction     PT LONG TERM GOAL #3   Title Pt will improve FOT    Status On-going      PT LONG TERM GOAL #4   Title Pt will be able to perform IADLs and yard work with <3/10 pain with proper body mechanics.    Baseline painful    Time 8    Period Weeks    Status Partially Met   no pain with yard work, sore after; need to work on [place] #5   Title Pt will lift 25# from floor to waist and 10# overhead with proper body mechanics and </=3/10 back pain.    Baseline pt has pain lifting groceries from floor    Time 8    Period Weeks    Status On-going                   Plan - 02/04/21 1020     Clinical Impression Statement Reviewed body mechanics with patient during weed eating to reduce strain on LB with ADLs. Instructed him on keep slight bend at knees and side step to keep from twisting at spine. Continued to progress core stab exercises to patient's tolerance. Occasional cues required for technique and form. He still experiences stiffness with most lumbar motions  and requires instruction with ADLs to reduce stress on lumbar spine.    Personal Factors and Comorbidities Age;Past/Current Experience;Time since onset of injury/illness/exacerbation;Fitness;Comorbidity 2    Comorbidities Arthritis, asthma    PT Frequency Other (comment)    PT Duration 8 weeks    PT Treatment/Interventions ADLs/Self Care Home Management;Aquatic Therapy;Cryotherapy;Electrical Stimulation;Iontophoresis 4mg /ml Dexamethasone;Traction;Moist Heat;Gait training;Stair training;Functional mobility training;Therapeutic activities;Therapeutic exercise;Balance training;Neuromuscular re-education;Patient/family education;Manual techniques;Passive range of motion;Dry needling;Taping;Spinal Manipulations;Joint Manipulations    PT Next Visit Plan Assess response to HEP/update PRN, hip flexibility, lumbar mobility focusing on rotation, core and hip stab    PT [place] (updated 01/19/21)    Consulted and Agree with Plan of Care Patient             Patient will benefit from skilled therapeutic intervention in order to improve the following deficits and impairments:  Hypomobility, Improper body mechanics, Impaired perceived functional ability, Decreased range of motion, Decreased activity tolerance, Decreased strength, Increased fascial restricitons, Impaired flexibility, Postural dysfunction, Pain  Visit Diagnosis: Chronic bilateral low back pain without sciatica  Abnormal posture  Other symptoms and signs involving the musculoskeletal system     Problem List Patient Active Problem List   Diagnosis Date Noted   Psoriatic arthritis ([place]) 04/14/2019   Prediabetes 04/14/2019   Factor V Leiden mutation ([place]) 04/07/2017   Deep vein thrombosis (DVT) of proximal vein of right lower extremity ([place]) 03/01/2017   Elevated PSA 06/28/2016   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    

## 2021-02-15 ENCOUNTER — Other Ambulatory Visit: Payer: Self-pay

## 2021-02-15 ENCOUNTER — Ambulatory Visit: Payer: Medicare PPO

## 2021-02-15 DIAGNOSIS — G8929 Other chronic pain: Secondary | ICD-10-CM | POA: Diagnosis not present

## 2021-02-15 DIAGNOSIS — M545 Low back pain, unspecified: Secondary | ICD-10-CM | POA: Diagnosis not present

## 2021-02-15 DIAGNOSIS — R293 Abnormal posture: Secondary | ICD-10-CM

## 2021-02-15 DIAGNOSIS — R29898 Other symptoms and signs involving the musculoskeletal system: Secondary | ICD-10-CM

## 2021-02-15 NOTE — Therapy (Signed)
[facility] [address]  [place], [place], [address] Phone: [phone]   Fax:  [phone]  Physical Therapy Treatment  Patient Details  Name: Randall Scott MRN: [id number] Date of Birth: 1950-09-01 Referring Provider (PT): Gavin Pound, MD   Encounter Date: 02/15/2021   PT End of Session - 02/15/21 1012     Visit Number 9    Number of Visits 16    Date for PT Re-Evaluation 03/03/21    Authorization Type [employer]    Authorization - Visit Number 9    Authorization - Number of Visits 16    Progress Note Due on Visit 10    PT Start Time 0931    PT Stop Time 1012    PT Time Calculation (min) 41 min    Activity Tolerance Patient tolerated treatment well    Behavior During Therapy [facility] for tasks assessed/performed             Past Medical History:  Diagnosis Date   Allergy    Arthritis    Asthma    Cataract     Past Surgical History:  Procedure Laterality Date   EYE SURGERY     HERNIA REPAIR     PERIPHERAL VASCULAR THROMBECTOMY Right    RLE    There were no vitals filed for this visit.   Subjective Assessment - 02/15/21 0933     Subjective Has been very stiff lately.    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 No/denies                [facility] PT Assessment - 02/15/21 0001       AROM   Lumbar Flexion hands down to ankles    Lumbar Extension 25% limited    Lumbar - Right Side Bend hands down to distal femur    Lumbar - Left Side Bend hands down to knee jt line    Lumbar - Right Rotation WFL    Lumbar - Left Rotation [facility]                           [facility] Adult PT Treatment/Exercise - 02/15/21 0001       Lumbar Exercises: Stretches   Lower Trunk Rotation 5 reps;10 seconds     Lower Trunk Rotation Limitations both sides    Standing Side Bend Right;Left    Standing Side Bend Limitations 10x5"; at wall for support    Standing Extension 10 reps;5 seconds    Standing Extension Limitations arms on wall; more limitation on L side    Other Lumbar Stretch Exercise seated orange pball rollout into flexion 10x5"      Lumbar Exercises: Aerobic   Nustep L5x4mn      Lumbar Exercises: Standing   Other Standing Lumbar Exercises snow angles on wall 10 reps    Other Standing Lumbar Exercises hip hikes on stepper 10 reps each side      Lumbar Exercises: Supine   Bridge with March 10 reps    Other Supine Lumbar Exercises B shoulder flexion with yellow weighted ball, 10 reps   core braced  PT Short Term Goals - 01/26/21 1009       PT SHORT TERM GOAL #1   Title Pt will be I and compliant with initial HEP.    Time 3    Period Weeks    Status Achieved    Target Date 01/27/21               PT Long Term Goals - 02/15/21 1011       PT LONG TERM GOAL #1   Title Pt will be independent with long term HEP for maintenance.    Time 8    Period Weeks    Status On-going      PT LONG TERM GOAL #2   Title Pt will improve lumbar AROM to [facility] without pain.    Baseline see flowsheet    Time 8    Period Weeks    Status On-going   met with B rotation, still limited with other motions     PT LONG TERM GOAL #3   Title Pt will improve FOT    Status On-going      PT LONG TERM GOAL #4   Title Pt will be able to perform IADLs and yard work with <3/10 pain with proper body mechanics.    Baseline painful    Time 8    Period Weeks    Status Partially Met   no pain with yard work, sore after; need to work on [place] #5   Title Pt will lift 25# from floor to waist and 10# overhead with proper body mechanics and </=3/10 back pain.    Baseline pt has pain lifting groceries from floor    Time 8    Period Weeks    Status  On-going                   Plan - 02/15/21 1013     Clinical Impression Statement Pt has still been having stiffness is his low back. Most limitation was noted with R side bend and lumbar flexion. Intermittnent cues given during session to produce the desired movements. He demonstrated a good performance of exercises with no reports of pain. Pt is making progress towards LTG 2 and responded well to treatment.    Personal Factors and Comorbidities Age;Past/Current Experience;Time since onset of injury/illness/exacerbation;Fitness;Comorbidity 2    Comorbidities Arthritis, asthma    PT Frequency Other (comment)    PT Duration 8 weeks    PT Treatment/Interventions ADLs/Self Care Home Management;Aquatic Therapy;Cryotherapy;Electrical Stimulation;Iontophoresis 44m/ml Dexamethasone;Traction;Moist Heat;Gait training;Stair training;Functional mobility training;Therapeutic activities;Therapeutic exercise;Balance training;Neuromuscular re-education;Patient/family education;Manual techniques;Passive range of motion;Dry needling;Taping;Spinal Manipulations;Joint Manipulations    PT Next Visit Plan Assess response to HEP/update PRN, hip flexibility, lumbar mobility focusing on rotation, core and hip stab    PT Home Exercise [place](updated 01/19/21)    Consulted and Agree with Plan of Care Patient             Patient will benefit from skilled therapeutic intervention in order to improve the following deficits and impairments:  Hypomobility, Improper body mechanics, Impaired perceived functional ability, Decreased range of motion, Decreased activity tolerance, Decreased strength, Increased fascial restricitons, Impaired flexibility, Postural dysfunction, Pain  Visit Diagnosis: Chronic bilateral low back pain without sciatica  Abnormal posture  Other symptoms and signs involving the musculoskeletal system     Problem List Patient Active Problem List   Diagnosis Date Noted    Psoriatic arthritis ([place] 04/14/2019  Prediabetes 04/14/2019   Factor V Leiden mutation ([place]) 04/07/2017   Deep vein thrombosis (DVT) of proximal vein of right lower extremity ([place]) 03/01/2017   Elevated PSA 06/28/2016   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  [place], [place], [address] Phone: [phone]   Fax:  [phone]  Name: AYRON FILLINGER MRN: [id number] Date of Birth: [birth year]

## 2021-02-17 ENCOUNTER — Other Ambulatory Visit: Payer: Self-pay

## 2021-02-17 ENCOUNTER — Ambulatory Visit: Payer: Medicare PPO | Admitting: Physical Therapy

## 2021-02-17 ENCOUNTER — Encounter: Payer: Self-pay | Admitting: Physical Therapy

## 2021-02-17 DIAGNOSIS — R293 Abnormal posture: Secondary | ICD-10-CM

## 2021-02-17 DIAGNOSIS — G8929 Other chronic pain: Secondary | ICD-10-CM | POA: Diagnosis not present

## 2021-02-17 DIAGNOSIS — M545 Low back pain, unspecified: Secondary | ICD-10-CM | POA: Diagnosis not present

## 2021-02-17 DIAGNOSIS — R29898 Other symptoms and signs involving the musculoskeletal system: Secondary | ICD-10-CM

## 2021-02-17 NOTE — Therapy (Signed)
[facility] [address]  [place], [place], [address] Phone: [phone]   Fax:  [phone]  Physical Therapy Treatment  Progress Note Reporting Period 01/06/2021 to 02/17/2021  See note below for Objective Data and Assessment of Progress/Goals.     Patient Details  Name: Randall Scott MRN: [id number] Date of Birth: [birth year] Referring Provider (PT): Gavin Pound, MD   Encounter Date: 02/17/2021   PT End of Session - 02/17/21 0926     Visit Number 10    Number of Visits 16    Date for PT Re-Evaluation 03/03/21    Authorization Type [employer]    Authorization - Visit Number 9    Authorization - Number of Visits 16    Progress Note Due on Visit 10    PT Start Time 0930    PT Stop Time 1012    PT Time Calculation (min) 42 min    Activity Tolerance Patient tolerated treatment well    Behavior During Therapy WFL for tasks assessed/performed             Past Medical History:  Diagnosis Date   Allergy    Arthritis    Asthma    Cataract     Past Surgical History:  Procedure Laterality Date   EYE SURGERY     HERNIA REPAIR     PERIPHERAL VASCULAR THROMBECTOMY Right    RLE    There were no vitals filed for this visit.   Subjective Assessment - 02/17/21 0926     Subjective A little sore on R side.  No new concerns.  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    Patient Stated Goals would like to be out of pain, not limited by pain    Pain Score 4     Pain Location Back    Pain Orientation Right;Lower    Pain Descriptors / Indicators Aching                [facility] PT Assessment - 02/17/21 0001       Assessment   Medical Diagnosis Lumbar DDD    Referring Provider (PT) Gavin Pound, MD    Hand  Dominance Right    Next MD Visit ~6 months    Prior Therapy R frozen shoulder, L lateral epicondylitis      Observation/Other Assessments   Focus on Therapeutic Outcomes (FOTO)  57      AROM   Lumbar Flexion hands down to ankles    Lumbar Extension 25% limited    Lumbar - Right Side Bend hands down to distal femur    Lumbar - Left Side Bend hands down to knee jt line    Lumbar - Right Rotation [facility]    Lumbar - Left Rotation [facility]                           [facility] Adult PT Treatment/Exercise - 02/17/21 0001       Therapeutic Activites    Therapeutic Activities ADL's;Lifting;Other Therapeutic Activities    ADL's golfer's lift, lifting leg when reaching to keep spine in neutral    Lifting lifting mechanics, keep close to center, turn feet do not rotate with heavy objects, keep  head up,squat down.     Other Therapeutic Activities review of goals and progress      Lumbar Exercises: Stretches   Lower Trunk Rotation 5 reps;10 seconds    Lower Trunk Rotation Limitations both sides      Lumbar Exercises: Aerobic   Nustep L5x60mn      Lumbar Exercises: Machines for Strengthening   Other Lumbar Machine Exercise standing lat pulldown 1 x10, 15#, 1 x 10 25#    Other Lumbar Machine Exercise paloff press 5# stepping out 2 x 10 bil                      PT Short Term Goals - 01/26/21 1009       PT SHORT TERM GOAL #1   Title Pt will be I and compliant with initial HEP.    Time 3    Period Weeks    Status Achieved    Target Date 01/27/21               PT Long Term Goals - 02/17/21 09147      PT LONG TERM GOAL #1   Title Pt will be independent with long term HEP for maintenance.    Time 8    Period Weeks    Status On-going   reports complaince, performs daily   Target Date 03/03/21      PT LONG TERM GOAL #2   Title [facility]    Baseline see flowsheet    Time 8    Period Weeks    Status On-going   met with  B rotation, still limited with other motions     PT LONG TERM GOAL #3   Title Pt will improve FOTO    Time 8    Period Weeks    Status Achieved   progressing 56 from 439  Target Date 03/03/21      PT LONG TERM GOAL #4   Title Pt will be able to perform IADLs and yard work with <3/10 pain with proper body mechanics.    Baseline painful    Time 8    Period Weeks    Status Partially Met   depends on how much stooping and bending   Target Date 03/03/21      PT LONG TERM GOAL #5   Title Pt will lift 25# from floor to waist and 10# overhead with proper body mechanics and </=3/10 back pain.    Baseline pt has pain lifting groceries from floor    Time 8    Period Weeks    Status On-going   does not report limitation with lifting groceries today                  Plan - 02/17/21 0927     Clinical Impression Statement Patient has been making good progress towards goals and demonstrated improvement in FOTO score today.  He is still primarily limited by R sided low back pain and stiffness, reviewed HEP and recommendations for LTR in morning before getting out of bed, and upper trunk twist during day to side of preference to help with stiffness.  Also educated on body mechanics and lifting, reviewed golfers lift and performed RLDs for hamstring strenthening and stretch.  Tolerated all exercises well and would benefit from continued skilled therapy.  Declined modalities at end.    Personal Factors and Comorbidities Age;Past/Current Experience;Time since onset of injury/illness/exacerbation;Fitness;Comorbidity  2    Comorbidities Arthritis, asthma    PT Frequency Other (comment)    PT Duration 8 weeks    PT Treatment/Interventions ADLs/Self Care Home Management;Aquatic Therapy;Cryotherapy;Electrical Stimulation;Iontophoresis 63m/ml Dexamethasone;Traction;Moist Heat;Gait training;Stair training;Functional mobility training;Therapeutic activities;Therapeutic exercise;Balance  training;Neuromuscular re-education;Patient/family education;Manual techniques;Passive range of motion;Dry needling;Taping;Spinal Manipulations;Joint Manipulations    PT Next Visit Plan Assess response to HEP/update PRN, hip flexibility, lumbar mobility focusing on rotation, core and hip stab    PT Home Exercise Plan [email or web address] (updated 01/19/21)    Consulted and Agree with Plan of Care Patient             Patient will benefit from skilled therapeutic intervention in order to improve the following deficits and impairments:  Hypomobility, Improper body mechanics, Impaired perceived functional ability, Decreased range of motion, Decreased activity tolerance, Decreased strength, Increased fascial restricitons, Impaired flexibility, Postural dysfunction, Pain  Visit Diagnosis: Chronic bilateral low back pain without sciatica  Abnormal posture  Other symptoms and signs involving the musculoskeletal system     Problem List Patient Active Problem List   Diagnosis Date Noted   Psoriatic arthritis ([place] 04/14/2019   Prediabetes 04/14/2019   Factor V Leiden mutation ([place] 04/07/2017   Deep vein thrombosis (DVT) of proximal vein of right lower extremity ([place] 03/01/2017   Elevated PSA 06/28/2016   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 [place] [address]Phone: [phone]  Fax:  [phone] Name: EHALLIE ISHIDAMRN: [id number] of Birth: 207/02/1951

## 2021-02-22 ENCOUNTER — Other Ambulatory Visit: Payer: Self-pay

## 2021-02-22 ENCOUNTER — Encounter: Payer: Self-pay | Admitting: Physical Therapy

## 2021-02-22 ENCOUNTER — Ambulatory Visit: Payer: Medicare PPO | Admitting: Physical Therapy

## 2021-02-22 DIAGNOSIS — G8929 Other chronic pain: Secondary | ICD-10-CM

## 2021-02-22 DIAGNOSIS — M545 Low back pain, unspecified: Secondary | ICD-10-CM

## 2021-02-22 DIAGNOSIS — R293 Abnormal posture: Secondary | ICD-10-CM | POA: Diagnosis not present

## 2021-02-22 DIAGNOSIS — R29898 Other symptoms and signs involving the musculoskeletal system: Secondary | ICD-10-CM | POA: Diagnosis not present

## 2021-02-22 NOTE — Therapy (Signed)
[facility] [address]  [place], [place], [address] Phone: [phone]   Fax:  [phone]  Physical Therapy Treatment  Patient Details  Name: Randall Scott MRN: [id number] Date of Birth: 08/31/1950 Referring Provider (PT): Gavin Pound, MD   Encounter Date: 02/22/2021   PT End of Session - 02/22/21 0931     Visit Number 11    Number of Visits 16    Date for PT Re-Evaluation 03/03/21    Authorization Type [employer]    Authorization - Visit Number 9    Authorization - Number of Visits 16    Progress Note Due on Visit 20    PT Start Time 0932    PT Stop Time 1012    PT Time Calculation (min) 40 min    Activity Tolerance Patient tolerated treatment well    Behavior During Therapy [facility] for tasks assessed/performed             Past Medical History:  Diagnosis Date   Allergy    Arthritis    Asthma    Cataract     Past Surgical History:  Procedure Laterality Date   EYE SURGERY     HERNIA REPAIR     PERIPHERAL VASCULAR THROMBECTOMY Right    RLE    There were no vitals filed for this visit.   Subjective Assessment - 02/22/21 0933     Subjective Reports back is about the same.    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 No/denies    Pain Score 0-No pain    Pain Location Back    Pain Orientation Right    Pain Descriptors / Indicators Sore;Tightness    Pain Type Chronic pain                               [facility] Adult PT Treatment/Exercise - 02/22/21 0001       Exercises   Exercises Lumbar;Knee/Hip      Lumbar Exercises: Aerobic   Nustep L5x4mn      Lumbar Exercises: Standing   Other Standing Lumbar Exercises standing lumbar extension on wall 2 x 10    Other Standing Lumbar  Exercises side extension on wall x 10 bil - preferred just extension.      Lumbar Exercises: Quadruped   Straight Leg Raise 10 reps    Opposite Arm/Leg Raise Right arm/Left leg;Left arm/Right leg;10 reps    Other Quadruped Lumbar Exercises child pose stretch - preferred straight back      Knee/Hip Exercises: Stretches   Active Hamstring Stretch Both;2 reps;30 seconds      Modalities   Modalities --   declined     Manual Therapy   Manual Therapy Soft tissue mobilization;Joint mobilization    Joint Mobilization UPA mobs to bil L2-5    Soft tissue mobilization STM to lumbar paraspinals, QL and piriformis                    PT Education - 02/22/21 1015     Education Details progressed HEP    Person(s) Educated Patient    Methods Explanation;Demonstration;Handout    Comprehension Verbalized understanding;Returned demonstration  PT Short Term Goals - 01/26/21 1009       PT SHORT TERM GOAL #1   Title Pt will be I and compliant with initial HEP.    Time 3    Period Weeks    Status Achieved    Target Date 01/27/21               PT Long Term Goals - 02/17/21 1937       PT LONG TERM GOAL #1   Title Pt will be independent with long term HEP for maintenance.    Time 8    Period Weeks    Status On-going   reports complaince, performs daily   Target Date 03/03/21      PT LONG TERM GOAL #2   Title Pt will improve lumbar AROM to [facility] without pain.    Baseline see flowsheet    Time 8    Period Weeks    Status On-going   met with B rotation, still limited with other motions     PT LONG TERM GOAL #3   Title Pt will improve FOTO    Time 8    Period Weeks    Status Achieved   progressing 56 from 72   Target Date 03/03/21      PT LONG TERM GOAL #4   Title Pt will be able to perform IADLs and yard work with <3/10 pain with proper body mechanics.    Baseline painful    Time 8    Period Weeks    Status Partially Met   depends on how much  stooping and bending   Target Date 03/03/21      PT LONG TERM GOAL #5   Title Pt will lift 25# from floor to waist and 10# overhead with proper body mechanics and </=3/10 back pain.    Baseline pt has pain lifting groceries from floor    Time 8    Period Weeks    Status On-going   does not report limitation with lifting groceries today                  Plan - 02/22/21 1013     Clinical Impression Statement Patient continues to report stiffness in back.  Progressed HEP to include extension at wall, which he tolerated well, preferred over side bending.  Also noted with child pose stretch preferred straight over deviation from side to side.  Able to perform bird-dogs with no difficulty today.  With manual therapy note continues to be very hypomobile throughout lumbar spine and patient reported continued stiffness after interventions, but declined modalities.  Pt. would benefit from continued skilled therapy.    Personal Factors and Comorbidities Age;Past/Current Experience;Time since onset of injury/illness/exacerbation;Fitness;Comorbidity 2    Comorbidities Arthritis, asthma    PT Frequency Other (comment)    PT Duration 8 weeks    PT Treatment/Interventions ADLs/Self Care Home Management;Aquatic Therapy;Cryotherapy;Electrical Stimulation;Iontophoresis 29m/ml Dexamethasone;Traction;Moist Heat;Gait training;Stair training;Functional mobility training;Therapeutic activities;Therapeutic exercise;Balance training;Neuromuscular re-education;Patient/family education;Manual techniques;Passive range of motion;Dry needling;Taping;Spinal Manipulations;Joint Manipulations    PT Next Visit Plan Assess response to HEP/update PRN, hip flexibility, lumbar mobility focusing on rotation, core and hip stab    PT Home Exercise [place](updated 01/19/21)    Consulted and Agree with Plan of Care Patient             Patient will benefit from skilled therapeutic intervention in order to improve  the following deficits and impairments:  Hypomobility, Improper body mechanics,  Impaired perceived functional ability, Decreased range of motion, Decreased activity tolerance, Decreased strength, Increased fascial restricitons, Impaired flexibility, Postural dysfunction, Pain  Visit Diagnosis: Chronic bilateral low back pain without sciatica  Abnormal posture     Problem List Patient Active Problem List   Diagnosis Date Noted   Psoriatic arthritis ([place]) 04/14/2019   Prediabetes 04/14/2019   Factor V Leiden mutation ([place]) 04/07/2017   Deep vein thrombosis (DVT) of proximal vein of right lower extremity ([place]) 03/01/2017   Elevated PSA 06/28/2016   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  [place], [place], [address] Phone: [phone]   Fax:  [phone]  Name: Randall Scott MRN: [id number] Date of Birth: [birth year]

## 2021-02-22 NOTE — Patient Instructions (Signed)
Access Code: [facility] URL: [email or web address] Date: 02/22/2021 Prepared by: Glenetta Hew  Exercises Standing Lumbar Extension at [place] 6 x daily - 7 x weekly - 1 sets - 15 reps

## 2021-02-24 ENCOUNTER — Other Ambulatory Visit: Payer: Self-pay

## 2021-02-24 ENCOUNTER — Encounter: Payer: Self-pay | Admitting: Physical Therapy

## 2021-02-24 ENCOUNTER — Ambulatory Visit: Payer: Medicare PPO | Admitting: Physical Therapy

## 2021-02-24 DIAGNOSIS — R293 Abnormal posture: Secondary | ICD-10-CM

## 2021-02-24 DIAGNOSIS — G8929 Other chronic pain: Secondary | ICD-10-CM

## 2021-02-24 DIAGNOSIS — M545 Low back pain, unspecified: Secondary | ICD-10-CM | POA: Diagnosis not present

## 2021-02-24 DIAGNOSIS — R29898 Other symptoms and signs involving the musculoskeletal system: Secondary | ICD-10-CM | POA: Diagnosis not present

## 2021-02-24 NOTE — Therapy (Signed)
[facility] [address]  [place], [place], [address] Phone: [phone]   Fax:  [phone]  Physical Therapy Treatment  Patient Details  Name: Randall Scott MRN: [id number] Date of Birth: 22-Sep-1950 Referring Provider (PT): Gavin Pound, MD   Encounter Date: 02/24/2021   PT End of Session - 02/24/21 1009     Visit Number 12    Number of Visits 16    Date for PT Re-Evaluation 03/03/21    Authorization Type [employer]    Authorization - Visit Number 9    Authorization - Number of Visits 16    Progress Note Due on Visit 20    PT Start Time 0930    PT Stop Time 1010    PT Time Calculation (min) 40 min    Activity Tolerance Patient tolerated treatment well    Behavior During Therapy [facility] for tasks assessed/performed             Past Medical History:  Diagnosis Date   Allergy    Arthritis    Asthma    Cataract     Past Surgical History:  Procedure Laterality Date   EYE SURGERY     HERNIA REPAIR     PERIPHERAL VASCULAR THROMBECTOMY Right    RLE    There were no vitals filed for this visit.   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 No/denies    Pain Location Back    Pain Descriptors / Indicators Tightness                               [facility] Adult PT Treatment/Exercise - 02/24/21 0001       Exercises   Exercises Lumbar;Knee/Hip      Lumbar Exercises: Aerobic   Recumbent Bike L2x62mn      Lumbar Exercises: Standing   Forward Lunge 20 reps    Forward Lunge Limitations bil, UE support    Side Lunge 15 reps    Side Lunge Limitations UE support, bil    Other Standing Lumbar Exercises standing lumbar extension  on wall 2 x 10    Other Standing Lumbar Exercises T-bend with counter support 2 x 10 bil      Lumbar Exercises: Quadruped   [place] 10 reps    Opposite Arm/Leg Raise Right arm/Left leg;Left arm/Right leg;20 reps      Manual Therapy   Manual Therapy Soft tissue mobilization;Joint mobilization    Manual therapy comments prone    Joint Mobilization UPA mobs to bil L2-5    Soft tissue mobilization STM to lumbar paraspinals, QL and piriformis                      PT Short Term Goals - 01/26/21 1009       PT SHORT TERM GOAL #1   Title Pt will be I and compliant with initial HEP.    Time 3    Period Weeks    Status Achieved    Target Date 01/27/21  PT Long Term Goals - 02/17/21 2229       PT LONG TERM GOAL #1   Title Pt will be independent with long term HEP for maintenance.    Time 8    Period Weeks    Status On-going   reports complaince, performs daily   Target Date 03/03/21      PT LONG TERM GOAL #2   Title Pt will improve lumbar AROM to [facility] without pain.    Baseline see flowsheet    Time 8    Period Weeks    Status On-going   met with B rotation, still limited with other motions     PT LONG TERM GOAL #3   Title Pt will improve FOTO    Time 8    Period Weeks    Status Achieved   progressing 56 from 48   Target Date 03/03/21      PT LONG TERM GOAL #4   Title Pt will be able to perform IADLs and yard work with <3/10 pain with proper body mechanics.    Baseline painful    Time 8    Period Weeks    Status Partially Met   depends on how much stooping and bending   Target Date 03/03/21      PT LONG TERM GOAL #5   Title Pt will lift 25# from floor to waist and 10# overhead with proper body mechanics and </=3/10 back pain.    Baseline pt has pain lifting groceries from floor    Time 8    Period Weeks    Status On-going   does not report limitation with lifting groceries today                  Plan - 02/24/21 1014      Clinical Impression Statement Pt has noted that after taking methotrexate for psoriatic arthritis on Monday, very fatigued on Tuesdays.  Today is a good day, just stiff.  Discussed importance of maintaing strength and flexibility, but some stiffness is expected with his type of arthritis.  focus of today's interventions on glut/extensor strengthening, which he tolerated well.  Noted more mobility with cat/cow exercise.  Pt. reported decreased tightness after manual therapy.  Pt. would benefit from continued skilled therapy.    Personal Factors and Comorbidities Age;Past/Current Experience;Time since onset of injury/illness/exacerbation;Fitness;Comorbidity 2    Comorbidities Arthritis, asthma    PT Frequency Other (comment)    PT Duration 8 weeks    PT Treatment/Interventions ADLs/Self Care Home Management;Aquatic Therapy;Cryotherapy;Electrical Stimulation;Iontophoresis 17m/ml Dexamethasone;Traction;Moist Heat;Gait training;Stair training;Functional mobility training;Therapeutic activities;Therapeutic exercise;Balance training;Neuromuscular re-education;Patient/family education;Manual techniques;Passive range of motion;Dry needling;Taping;Spinal Manipulations;Joint Manipulations    PT Next Visit Plan Assess response to HEP/update PRN, hip flexibility, lumbar mobility focusing on rotation, core and hip stab    PT Home Exercise [place](updated 01/19/21)    Consulted and Agree with Plan of Care Patient             Patient will benefit from skilled therapeutic intervention in order to improve the following deficits and impairments:  Hypomobility, Improper body mechanics, Impaired perceived functional ability, Decreased range of motion, Decreased activity tolerance, Decreased strength, Increased fascial restricitons, Impaired flexibility, Postural dysfunction, Pain  Visit Diagnosis: Chronic bilateral low back pain without sciatica  Abnormal posture  Other symptoms and signs involving the  musculoskeletal system     Problem List Patient Active Problem List   Diagnosis Date Noted   Psoriatic arthritis ([place] 04/14/2019  Prediabetes 04/14/2019   Factor V Leiden mutation ([place]) 04/07/2017   Deep vein thrombosis (DVT) of proximal vein of right lower extremity ([place]) 03/01/2017   Elevated PSA 06/28/2016   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  [place], [place], [address] Phone: [phone]   Fax:  [phone]  Name: ROYALE SWAMY MRN: [id number] Date of Birth: [birth year]

## 2021-02-25 DIAGNOSIS — L4050 Arthropathic psoriasis, unspecified: Secondary | ICD-10-CM | POA: Diagnosis not present

## 2021-03-01 ENCOUNTER — Other Ambulatory Visit: Payer: Self-pay

## 2021-03-01 ENCOUNTER — Ambulatory Visit: Payer: Medicare PPO | Admitting: Physical Therapy

## 2021-03-01 DIAGNOSIS — R293 Abnormal posture: Secondary | ICD-10-CM

## 2021-03-01 DIAGNOSIS — G8929 Other chronic pain: Secondary | ICD-10-CM | POA: Diagnosis not present

## 2021-03-01 DIAGNOSIS — R29898 Other symptoms and signs involving the musculoskeletal system: Secondary | ICD-10-CM | POA: Diagnosis not present

## 2021-03-01 DIAGNOSIS — M545 Low back pain, unspecified: Secondary | ICD-10-CM | POA: Diagnosis not present

## 2021-03-01 NOTE — Therapy (Signed)
[facility] [address]  [place], [place], [address] Phone: [phone]   Fax:  [phone]  Physical Therapy Treatment  Patient Details  Name: Randall Scott MRN: [id number] Date of Birth: [birth year] Referring Provider (PT): Gavin Pound, MD   Encounter Date: 03/01/2021   PT End of Session - 03/01/21 0938     Visit Number 13    Number of Visits 16    Date for PT Re-Evaluation 03/03/21    Authorization Type [employer]    Authorization - Visit Number 9    Authorization - Number of Visits 16    Progress Note Due on Visit 20    PT Start Time 0933    PT Stop Time 1013    PT Time Calculation (min) 40 min    Activity Tolerance Patient tolerated treatment well    Behavior During Therapy [facility] for tasks assessed/performed             Past Medical History:  Diagnosis Date   Allergy    Arthritis    Asthma    Cataract     Past Surgical History:  Procedure Laterality Date   EYE SURGERY     HERNIA REPAIR     PERIPHERAL VASCULAR THROMBECTOMY Right    RLE    There were no vitals filed for this visit.   Subjective Assessment - 03/01/21 0936     Subjective Back is ok today.  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 No/denies    Pain Location Back    Pain Descriptors / Indicators Tightness                               [facility] Adult PT Treatment/Exercise - 03/01/21 0001       Exercises   Exercises Lumbar;Knee/Hip      Lumbar Exercises: Aerobic   Recumbent Bike L2x23min      Lumbar Exercises: Machines for Strengthening   Cybex Lumbar Extension 20# 1 x 10, 25# 1 x 10    Leg Press 20# 1 x 15, 25# 1 x 15    Other Lumbar Machine Exercise Lat pulls 20# 1 x 10, 25# 1 x 10       Lumbar Exercises: Standing   Other Standing Lumbar Exercises standing lumbar extension on wall 2 x 10      Lumbar Exercises: Prone   Straight Leg Raise 10 reps    Straight Leg Raises Limitations bil      Lumbar Exercises: Quadruped   Opposite Arm/Leg Raise Right arm/Left leg;Left arm/Right leg;20 reps      Knee/Hip Exercises: Machines for Strengthening   Cybex Knee Flexion --      Manual Therapy   Manual Therapy Soft tissue mobilization;Joint mobilization    Manual therapy comments prone    Joint Mobilization UPA mobs to bil L2-5    Soft tissue mobilization STM to lumbar paraspinals, QL and piriformis                      PT Short Term Goals - 01/26/21 1009       PT SHORT TERM GOAL #1  Title Pt will be I and compliant with initial HEP.    Time 3    Period Weeks    Status Achieved    Target Date 01/27/21               PT Long Term Goals - 02/17/21 0017       PT LONG TERM GOAL #1   Title Pt will be independent with long term HEP for maintenance.    Time 8    Period Weeks    Status On-going   reports complaince, performs daily   Target Date 03/03/21      PT LONG TERM GOAL #2   Title Pt will improve lumbar AROM to [facility] without pain.    Baseline see flowsheet    Time 8    Period Weeks    Status On-going   met with B rotation, still limited with other motions     PT LONG TERM GOAL #3   Title Pt will improve FOTO    Time 8    Period Weeks    Status Achieved   progressing 56 from 42   Target Date 03/03/21      PT LONG TERM GOAL #4   Title Pt will be able to perform IADLs and yard work with <3/10 pain with proper body mechanics.    Baseline painful    Time 8    Period Weeks    Status Partially Met   depends on how much stooping and bending   Target Date 03/03/21      PT LONG TERM GOAL #5   Title Pt will lift 25# from floor to waist and 10# overhead with proper body mechanics and </=3/10 back pain.    Baseline pt has pain lifting groceries  from floor    Time 8    Period Weeks    Status On-going   does not report limitation with lifting groceries today                  Plan - 03/01/21 1020     Clinical Impression Statement Patient reports having last infusion methotrexate on Friday, so back is better.  Focus of session today progressing exercises back to gym based exercises, as patient has both total gym at home and [employer].  He was able to perform machines without complaint.  Also advanced exercises with prone leg extensions, which was much more challenging and increased tightness in lumbar paraspinals significantly, so finised session with manual therapy followed by standing lumbar extensions to relax.  Pt. would benefit from continued skilled therapy.    Personal Factors and Comorbidities Age;Past/Current Experience;Time since onset of injury/illness/exacerbation;Fitness;Comorbidity 2    Comorbidities Arthritis, asthma    PT Frequency Other (comment)    PT Duration 8 weeks    PT Treatment/Interventions ADLs/Self Care Home Management;Aquatic Therapy;Cryotherapy;Electrical Stimulation;Iontophoresis 55m/ml Dexamethasone;Traction;Moist Heat;Gait training;Stair training;Functional mobility training;Therapeutic activities;Therapeutic exercise;Balance training;Neuromuscular re-education;Patient/family education;Manual techniques;Passive range of motion;Dry needling;Taping;Spinal Manipulations;Joint Manipulations    PT Next Visit Plan Assess response to HEP/update PRN, hip flexibility, lumbar mobility focusing on rotation, core and hip stab    PT Home Exercise [place](updated 01/19/21)    Consulted and Agree with Plan of Care Patient             Patient will benefit from skilled therapeutic intervention in order to improve the following deficits and impairments:  Hypomobility, Improper body mechanics, Impaired perceived functional ability, Decreased range of motion, Decreased activity tolerance, Decreased  strength,  Increased fascial restricitons, Impaired flexibility, Postural dysfunction, Pain  Visit Diagnosis: Chronic bilateral low back pain without sciatica  Abnormal posture  Other symptoms and signs involving the musculoskeletal system     Problem List Patient Active Problem List   Diagnosis Date Noted   Psoriatic arthritis ([place]) 04/14/2019   Prediabetes 04/14/2019   Factor V Leiden mutation ([place]) 04/07/2017   Deep vein thrombosis (DVT) of proximal vein of right lower extremity ([place]) 03/01/2017   Elevated PSA 06/28/2016   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

## 2021-03-03 ENCOUNTER — Ambulatory Visit: Payer: Medicare PPO | Attending: Rheumatology

## 2021-03-03 ENCOUNTER — Other Ambulatory Visit: Payer: Self-pay

## 2021-03-03 DIAGNOSIS — R293 Abnormal posture: Secondary | ICD-10-CM | POA: Diagnosis not present

## 2021-03-03 DIAGNOSIS — M545 Low back pain, unspecified: Secondary | ICD-10-CM | POA: Diagnosis not present

## 2021-03-03 DIAGNOSIS — G8929 Other chronic pain: Secondary | ICD-10-CM | POA: Diagnosis not present

## 2021-03-03 DIAGNOSIS — R29898 Other symptoms and signs involving the musculoskeletal system: Secondary | ICD-10-CM | POA: Diagnosis not present

## 2021-03-03 NOTE — Therapy (Signed)
[facility] [address]  [place], [place], [address] Phone: [phone]   Fax:  [phone]  Physical Therapy Treatment  Patient Details  Name: Randall Scott MRN: [id number] Date of Birth: 1951/06/10 Referring Provider (PT): Gavin Pound, MD   Encounter Date: 03/03/2021   PT End of Session - 03/03/21 1010     Visit Number 14    Number of Visits 16    Date for PT Re-Evaluation 03/03/21    Authorization Type [employer]    Authorization - Visit Number 10    Authorization - Number of Visits 16    Progress Note Due on Visit 20    PT Start Time 0933    PT Stop Time 1009    PT Time Calculation (min) 36 min    Activity Tolerance Patient tolerated treatment well    Behavior During Therapy [facility] for tasks assessed/performed             Past Medical History:  Diagnosis Date   Allergy    Arthritis    Asthma    Cataract     Past Surgical History:  Procedure Laterality Date   EYE SURGERY     HERNIA REPAIR     PERIPHERAL VASCULAR THROMBECTOMY Right    RLE    There were no vitals filed for this visit.   Subjective Assessment - 03/03/21 0934     Subjective Pt reports that he does his exercises everyday, still feeling stiff early in the am.    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 No/denies                [facility] PT Assessment - 03/03/21 0001       Observation/Other Assessments   Focus on Therapeutic Outcomes (FOTO)  Lumbar: 62; predicted: 61      AROM   AROM Assessment Site Lumbar    Lumbar Flexion hands to distal leg    Lumbar Extension 25% limited    Lumbar - Right Side Bend hands to distal thigh    Lumbar - Left Side Bend hands to distal thigh    Lumbar - Right Rotation [facility]    Lumbar - Left Rotation [facility]                            [facility] Adult PT Treatment/Exercise - 03/03/21 0001       Lumbar Exercises: Aerobic   Nustep L5x24mn                    PT Education - 03/03/21 1158     Education Details HEP progression and brief review; review on posture and body mechanics.    Person(s) Educated Patient    Methods Explanation;Demonstration    Comprehension Verbalized understanding;Returned demonstration              PT Short Term Goals - 01/26/21 1009       PT SHORT TERM GOAL #1   Title Pt will be I and compliant with initial HEP.    Time 3    Period Weeks    Status Achieved    Target Date 01/27/21  PT Long Term Goals - 03/03/21 0946       PT LONG TERM GOAL #1   Title Pt will be independent with long term HEP for maintenance.    Time 8    Period Weeks    Status Achieved   reports complaince, performs daily     PT LONG TERM GOAL #2   Title Pt will improve lumbar AROM to [facility] without pain.    Baseline see flowsheet    Time 8    Period Weeks    Status Partially Met   no pain with motions, still stiffness noted but met with rotation     PT LONG TERM GOAL #3   Title Pt will improve FOTO    Time 8    Period Weeks    Status Achieved   progressing 56 from 20     PT LONG TERM GOAL #4   Title Pt will be able to perform IADLs and yard work with <3/10 pain with proper body mechanics.    Baseline painful    Time 8    Period Weeks    Status Achieved   has been more aware of posture and body mechanics during ADLs, only has soreness afterwards but below 3/10 pain during/after     PT LONG TERM GOAL #5   Title Pt will lift 25# from floor to waist and 10# overhead with proper body mechanics and </=3/10 back pain.    Baseline pt has pain lifting groceries from floor    Time 8    Period Weeks    Status Achieved   able to 25# box from floor and 10# OH with good body mechanics; no pain, reported stretching with OH lift                   Plan - 03/03/21 1012     Clinical Impression Statement Pt has made exceptional progress with PT. He noted that he never experiences much pain but rather stiffness mostly in the am. He was able to lift 25# from the floor to waist level, as well as 10# OH with good body mechanics. He is able to complete ADLs w/o pain but noted soreness afterward. FOTO score was improved and scored 1 point higher than expected. He is independent with his HEP, we briefly reviewed his HEP and administered green and blue therabands for increased resistance. He has met all of his LTGs except for Lumbar AROM. He still has stiffness overall in the low back, which seems to be the most limiting for him he is able to complete ADLs w/o limitations. Due to his progress he will be D/C as of today.    Personal Factors and Comorbidities Age;Past/Current Experience;Time since onset of injury/illness/exacerbation;Fitness;Comorbidity 2    Comorbidities Arthritis, asthma    PT Frequency Other (comment)    PT Duration 8 weeks    PT Treatment/Interventions ADLs/Self Care Home Management;Aquatic Therapy;Cryotherapy;Electrical Stimulation;Iontophoresis 56m/ml Dexamethasone;Traction;Moist Heat;Gait training;Stair training;Functional mobility training;Therapeutic activities;Therapeutic exercise;Balance training;Neuromuscular re-education;Patient/family education;Manual techniques;Passive range of motion;Dry needling;Taping;Spinal Manipulations;Joint Manipulations    PT Next Visit Plan D/C    PT Home Exercise [email or web address](updated 01/19/21)    Consulted and Agree with Plan of Care Patient             Patient will benefit from skilled therapeutic intervention in order to improve the following deficits and impairments:  Hypomobility, Improper body mechanics, Impaired perceived functional ability, Decreased range of motion, Decreased activity tolerance, Decreased strength, Increased fascial restricitons,  Impaired flexibility,  Postural dysfunction, Pain  Visit Diagnosis: Chronic bilateral low back pain without sciatica  Abnormal posture  Other symptoms and signs involving the musculoskeletal system     Problem List Patient Active Problem List   Diagnosis Date Noted   Psoriatic arthritis ([place]) 04/14/2019   Prediabetes 04/14/2019   Factor V Leiden mutation ([place]) 04/07/2017   Deep vein thrombosis (DVT) of proximal vein of right lower extremity ([place]) 03/01/2017   Elevated PSA 06/28/2016   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

## 2021-03-08 ENCOUNTER — Encounter: Payer: Medicare PPO | Admitting: Physical Therapy

## 2021-03-10 ENCOUNTER — Encounter: Payer: Medicare PPO | Admitting: Physical Therapy

## 2021-03-16 ENCOUNTER — Ambulatory Visit: Payer: PPO | Admitting: Physician Assistant

## 2021-03-16 ENCOUNTER — Ambulatory Visit: Payer: PPO | Admitting: Dermatology

## 2021-04-22 ENCOUNTER — Other Ambulatory Visit: Payer: Self-pay

## 2021-04-22 DIAGNOSIS — Z79899 Other long term (current) drug therapy: Secondary | ICD-10-CM | POA: Diagnosis not present

## 2021-04-22 DIAGNOSIS — L4050 Arthropathic psoriasis, unspecified: Secondary | ICD-10-CM | POA: Diagnosis not present

## 2021-04-22 NOTE — Patient Instructions (Addendum)
It was great to see you again today, I will be in touch your labs as soon as possible Flu shot today Please get the newest covid booster soon We will look for any explanation for your abdominal symptoms on your labs. Assuming normal we will plan to get a CT scan.  If still normal will refer to GI  Ok to try not taking omeprazole- if you feel worse not taking it can restart

## 2021-04-22 NOTE — Progress Notes (Addendum)
[facility] [place], [place], [place] [address] [phone]  Date:  04/25/2021   Name:  Randall Scott   DOB:  1950/07/10   MRN:  [id number]  PCP:  Darreld Mclean, MD    Chief Complaint: Annual Exam (Concerns/ questions: loss of appetite, increased gas, questions about Omeprazole./Flu shot today: yes/)   History of Present Illness:  Randall Scott is a 70 y.o. very pleasant male patient who presents with the following:  Patient seen today for physical exam Most recent visit with myself about 1 year ago, also for   History of psoriasis/psoriatic arthritis/spondyloarthropathy, factor V Leiden mutation with history of DVT 2018, prediabetes His psoriatic arthritis is managed by Dr. Trudie Reed and Marella Chimes at [facility] rheumatology  He currently takes a baby aspirin daily to prevent clotting-was able to come off Xarelto on hematology recommendation  His RA is treated with methotrexate and Simponi Aria, most recent rheumatology visit was in June.  He started the [place] in January of this year. Still taking methotrexate  He notes that his back is bothering him more than in the past He did some PT for his back which did help some  He notes decreased appetite recently and he may feel nauseated This has been present for about 3-4 months No abd pain, the nausea will just seem to come and go  He is on omeprazole chronically - takes 40 mg daily.  He has been on this for some time  He is somewhat concerned about taking this medication chronically, wonders if he can try stopping it Wt Readings from Last 3 Encounters:  04/25/21 176 lb 3.2 oz (79.9 kg)  05/03/20 172 lb (78 kg)  04/22/20 172 lb (78 kg)   No vomiting Bowels are normal   COVID booster-recommended Flu vaccine- today  Colonoscopy is due in 2024 Shingrix is done Pneumonia series is done  Patient Active Problem List   Diagnosis Date Noted   Psoriatic arthritis  ([place]) 04/14/2019   Prediabetes 04/14/2019   Factor V Leiden mutation ([place]) 04/07/2017   Deep vein thrombosis (DVT) of proximal vein of right lower extremity ([place]) 03/01/2017   Elevated PSA 06/28/2016   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   Headache(784.0) 08/21/2013   Lateral epicondylitis of right elbow 04/01/2013    Past Medical History:  Diagnosis Date   Allergy    Arthritis    Asthma    Cataract     Past Surgical History:  Procedure Laterality Date   EYE SURGERY     HERNIA REPAIR     PERIPHERAL VASCULAR THROMBECTOMY Right    RLE    Social History   Tobacco Use   Smoking status: Never   Smokeless tobacco: Never  Substance Use Topics   Alcohol use: No   Drug use: No    Family History  Problem Relation Age of Onset   Lung cancer Mother    Lung cancer Father    Sudden death Neg Hx    Hypertension Neg Hx    Hyperlipidemia Neg Hx    Heart attack Neg Hx    Diabetes Neg Hx     No Known Allergies  Medication list has been reviewed and updated.  Current Outpatient Medications on File Prior to Visit  Medication Sig Dispense Refill   aspirin 81 MG chewable tablet Chew 162 mg by mouth daily.     folic acid (FOLVITE) 1 MG tablet Take 1 mg  by mouth daily.     golimumab (SIMPONI ARIA) 50 MG/4ML SOLN injection See admin instructions.     methotrexate 2.5 MG tablet Take by mouth once a week.     omeprazole (PRILOSEC) 40 MG capsule Take 40 mg by mouth daily.     No current facility-administered medications on file prior to visit.    Review of Systems:  As per HPI- otherwise negative. He does monitor his BP at home - it may run low, but does not run high BP Readings from Last 3 Encounters:  04/25/21 112/64  04/22/20 110/70  08/18/19 112/75     Physical Examination: Vitals:   04/25/21 1009  BP: 112/64  Pulse: 66  Resp: 18  Temp: 97.7 F (36.5 C)  SpO2: 99%   Vitals:   04/25/21 1009  Weight: 176 lb 3.2 oz (79.9 kg)  Height: 5\' 8"  (1.727 m)   Body mass  index is 26.79 kg/m. Ideal Body Weight: Weight in (lb) to have BMI = 25: 164.1  GEN: no acute distress.  Minimal overweight, looks well HEENT: Atraumatic, Normocephalic.  Ears and Nose: No external deformity. CV: RRR, No M/G/R. No JVD. No thrill. No extra heart sounds. PULM: CTA B, no wheezes, crackles, rhonchi. No retractions. No resp. distress. No accessory muscle use. ABD: S, mild epigastric tenderness only, ND, +BS. No rebound. No HSM. EXTR: No c/c/e PSYCH: Normally interactive. Conversant.    Assessment and Plan: Physical exam  Elevated glucose - Plan: Comprehensive metabolic panel, Hemoglobin A1c  Screening for hyperlipidemia - Plan: Lipid panel  Screening for diabetes mellitus - Plan: Hemoglobin A1c  Screening for prostate cancer - Plan: PSA  Screening for deficiency anemia - Plan: CBC  Need for influenza vaccination - Plan: Flu Vaccine QUAD High Dose(Fluad)  Abdominal bloating - Plan: Lipase   Physical exam today.  Encouraged healthy diet and exercise routine Will plan further follow- up pending labs. Discussed health maintenance and recommended immunizations He has noted a somewhat vague feeling of abdominal bloating and nausea, lack of appetite.  Symptoms for about 3 months.  We will obtain labs as above-assuming normal, plan for CT abd/pelvis at the [facility].  If this is normal we will plan to have him see gastroenterology  Is interested in stopping omeprazole.  Advised him this is fine, if his symptoms change or worsen he can always go back on Signed Lamar Blinks, MD   Received his labs as below, message to pt   Results for orders placed or performed in visit on 04/25/21  CBC  Result Value Ref Range   WBC 8.9 4.0 - 10.5 K/uL   RBC 4.16 (L) 4.22 - 5.81 Mil/uL   Platelets 215.0 150.0 - 400.0 K/uL   Hemoglobin 14.3 13.0 - 17.0 g/dL   HCT 42.6 39.0 - 52.0 %   MCV 102.5 (H) 78.0 - 100.0 fl   MCHC 33.6 30.0 - 36.0 g/dL   RDW 15.2 11.5 - 15.5 %   Comprehensive metabolic panel  Result Value Ref Range   Sodium 142 135 - 145 mEq/L   Potassium 5.1 3.5 - 5.1 mEq/L   Chloride 107 96 - 112 mEq/L   CO2 28 19 - 32 mEq/L   Glucose, Bld 81 70 - 99 mg/dL   BUN 17 6 - 23 mg/dL   Creatinine, Ser 1.18 0.40 - 1.50 mg/dL   Total Bilirubin 0.7 0.2 - 1.2 mg/dL   Alkaline Phosphatase 48 39 - 117 U/L   AST 17 0 - 37  U/L   ALT 12 0 - 53 U/L   Total Protein 6.5 6.0 - 8.3 g/dL   Albumin 4.3 3.5 - 5.2 g/dL   GFR 62.45 >60.00 mL/min   Calcium 9.4 8.4 - 10.5 mg/dL  Hemoglobin A1c  Result Value Ref Range   Hgb A1c MFr Bld 5.6 4.6 - 6.5 %  Lipid panel  Result Value Ref Range   Cholesterol 161 0 - 200 mg/dL   Triglycerides 61.0 0.0 - 149.0 mg/dL   HDL 50.70 >39.00 mg/dL   VLDL 12.2 0.0 - 40.0 mg/dL   LDL Cholesterol 98 0 - 99 mg/dL   Total CHOL/HDL Ratio 3    NonHDL 110.17   PSA  Result Value Ref Range   PSA 1.64 0.10 - 4.00 ng/mL  Lipase  Result Value Ref Range   Lipase 10.0 (L) 11.0 - 59.0 U/L

## 2021-04-25 ENCOUNTER — Ambulatory Visit (INDEPENDENT_AMBULATORY_CARE_PROVIDER_SITE_OTHER): Payer: Medicare PPO | Admitting: Family Medicine

## 2021-04-25 ENCOUNTER — Other Ambulatory Visit: Payer: Self-pay

## 2021-04-25 ENCOUNTER — Encounter: Payer: Self-pay | Admitting: Family Medicine

## 2021-04-25 VITALS — BP 112/64 | HR 66 | Temp 97.7°F | Resp 18 | Ht 68.0 in | Wt 176.2 lb

## 2021-04-25 DIAGNOSIS — Z130 Encounter for screening for diseases of the blood and blood-forming organs and certain disorders involving the immune mechanism: Secondary | ICD-10-CM

## 2021-04-25 DIAGNOSIS — R7309 Other abnormal glucose: Secondary | ICD-10-CM | POA: Diagnosis not present

## 2021-04-25 DIAGNOSIS — Z0000 Encounter for general adult medical examination without abnormal findings: Secondary | ICD-10-CM

## 2021-04-25 DIAGNOSIS — R140 Abdominal distension (gaseous): Secondary | ICD-10-CM

## 2021-04-25 DIAGNOSIS — Z13220 Encounter for screening for lipoid disorders: Secondary | ICD-10-CM | POA: Diagnosis not present

## 2021-04-25 DIAGNOSIS — Z131 Encounter for screening for diabetes mellitus: Secondary | ICD-10-CM | POA: Diagnosis not present

## 2021-04-25 DIAGNOSIS — Z23 Encounter for immunization: Secondary | ICD-10-CM

## 2021-04-25 DIAGNOSIS — Z125 Encounter for screening for malignant neoplasm of prostate: Secondary | ICD-10-CM

## 2021-04-25 LAB — CBC
HCT: 42.6 % (ref 39.0–52.0)
Hemoglobin: 14.3 g/dL (ref 13.0–17.0)
MCHC: 33.6 g/dL (ref 30.0–36.0)
MCV: 102.5 fl — ABNORMAL HIGH (ref 78.0–100.0)
Platelets: 215 10*3/uL (ref 150.0–400.0)
RBC: 4.16 Mil/uL — ABNORMAL LOW (ref 4.22–5.81)
RDW: 15.2 % (ref 11.5–15.5)
WBC: 8.9 10*3/uL (ref 4.0–10.5)

## 2021-04-25 LAB — LIPID PANEL
Cholesterol: 161 mg/dL (ref 0–200)
HDL: 50.7 mg/dL (ref >39.00)
LDL Cholesterol: 98 mg/dL (ref 0–99)
NonHDL: 110.17
Total CHOL/HDL Ratio: 3
Triglycerides: 61 mg/dL (ref 0.0–149.0)
VLDL: 12.2 mg/dL (ref 0.0–40.0)

## 2021-04-25 LAB — PSA: PSA: 1.64 ng/mL (ref 0.10–4.00)

## 2021-04-25 LAB — COMPREHENSIVE METABOLIC PANEL
ALT: 12 U/L (ref 0–53)
AST: 17 U/L (ref 0–37)
Albumin: 4.3 g/dL (ref 3.5–5.2)
Alkaline Phosphatase: 48 U/L (ref 39–117)
BUN: 17 mg/dL (ref 6–23)
CO2: 28 mEq/L (ref 19–32)
Calcium: 9.4 mg/dL (ref 8.4–10.5)
Chloride: 107 mEq/L (ref 96–112)
Creatinine, Ser: 1.18 mg/dL (ref 0.40–1.50)
GFR: 62.45 mL/min (ref >60.00)
Glucose, Bld: 81 mg/dL (ref 70–99)
Potassium: 5.1 mEq/L (ref 3.5–5.1)
Sodium: 142 mEq/L (ref 135–145)
Total Bilirubin: 0.7 mg/dL (ref 0.2–1.2)
Total Protein: 6.5 g/dL (ref 6.0–8.3)

## 2021-04-25 LAB — HEMOGLOBIN A1C: Hgb A1c MFr Bld: 5.6 % (ref 4.6–6.5)

## 2021-04-25 LAB — LIPASE: Lipase: 10 U/L — ABNORMAL LOW (ref 11.0–59.0)

## 2021-04-25 NOTE — Addendum Note (Signed)
Addended by: Lamar Blinks C on: 04/25/2021 07:01 PM   Modules accepted: Orders

## 2021-04-26 NOTE — Telephone Encounter (Signed)
Called pt back - his labs that I did are normal.  Will fax to Dr Trudie Reed for him He has his Ct scheduled

## 2021-05-09 ENCOUNTER — Other Ambulatory Visit: Payer: Self-pay

## 2021-05-09 ENCOUNTER — Ambulatory Visit (HOSPITAL_BASED_OUTPATIENT_CLINIC_OR_DEPARTMENT_OTHER)
Admission: RE | Admit: 2021-05-09 | Discharge: 2021-05-09 | Disposition: A | Discharge status: Home / Self Care | Payer: Medicare PPO | Source: Ambulatory Visit | Attending: Family Medicine | Admitting: Family Medicine

## 2021-05-09 DIAGNOSIS — R140 Abdominal distension (gaseous): Secondary | ICD-10-CM | POA: Diagnosis not present

## 2021-05-09 DIAGNOSIS — L729 Follicular cyst of the skin and subcutaneous tissue, unspecified: Secondary | ICD-10-CM | POA: Diagnosis not present

## 2021-05-09 DIAGNOSIS — R110 Nausea: Secondary | ICD-10-CM | POA: Diagnosis not present

## 2021-05-09 DIAGNOSIS — K5730 Diverticulosis of large intestine without perforation or abscess without bleeding: Secondary | ICD-10-CM | POA: Diagnosis not present

## 2021-05-09 DIAGNOSIS — K449 Diaphragmatic hernia without obstruction or gangrene: Secondary | ICD-10-CM | POA: Diagnosis not present

## 2021-05-09 MED ORDER — IOHEXOL 300 MG/ML  SOLN
100.0000 mL | Freq: Once | INTRAMUSCULAR | Status: AC | PRN
  Administered 2021-05-09: 100 mL via INTRAVENOUS

## 2021-05-10 ENCOUNTER — Encounter: Payer: Self-pay | Admitting: Family Medicine

## 2021-06-16 ENCOUNTER — Other Ambulatory Visit: Payer: Self-pay

## 2021-06-16 ENCOUNTER — Ambulatory Visit (HOSPITAL_BASED_OUTPATIENT_CLINIC_OR_DEPARTMENT_OTHER)
Admission: RE | Admit: 2021-06-16 | Discharge: 2021-06-16 | Disposition: A | Discharge status: Home / Self Care | Payer: Medicare PPO | Source: Ambulatory Visit | Attending: Family Medicine | Admitting: Family Medicine

## 2021-06-16 ENCOUNTER — Encounter: Payer: Self-pay | Admitting: Family Medicine

## 2021-06-16 ENCOUNTER — Ambulatory Visit (INDEPENDENT_AMBULATORY_CARE_PROVIDER_SITE_OTHER): Payer: Medicare PPO | Admitting: Family Medicine

## 2021-06-16 VITALS — BP 110/70 | HR 85 | Resp 19

## 2021-06-16 DIAGNOSIS — Z1589 Genetic susceptibility to other disease: Secondary | ICD-10-CM | POA: Diagnosis not present

## 2021-06-16 DIAGNOSIS — I82811 Embolism and thrombosis of superficial veins of right lower extremities: Secondary | ICD-10-CM

## 2021-06-16 DIAGNOSIS — M545 Low back pain, unspecified: Secondary | ICD-10-CM | POA: Diagnosis not present

## 2021-06-16 DIAGNOSIS — D6851 Activated protein C resistance: Secondary | ICD-10-CM | POA: Diagnosis not present

## 2021-06-16 DIAGNOSIS — Z79899 Other long term (current) drug therapy: Secondary | ICD-10-CM | POA: Diagnosis not present

## 2021-06-16 DIAGNOSIS — Z6826 Body mass index (BMI) 26.0-26.9, adult: Secondary | ICD-10-CM | POA: Diagnosis not present

## 2021-06-16 DIAGNOSIS — I824Y1 Acute embolism and thrombosis of unspecified deep veins of right proximal lower extremity: Secondary | ICD-10-CM

## 2021-06-16 DIAGNOSIS — R2241 Localized swelling, mass and lump, right lower limb: Secondary | ICD-10-CM | POA: Diagnosis not present

## 2021-06-16 DIAGNOSIS — M79661 Pain in right lower leg: Secondary | ICD-10-CM | POA: Diagnosis not present

## 2021-06-16 DIAGNOSIS — E663 Overweight: Secondary | ICD-10-CM | POA: Diagnosis not present

## 2021-06-16 DIAGNOSIS — L4050 Arthropathic psoriasis, unspecified: Secondary | ICD-10-CM | POA: Diagnosis not present

## 2021-06-16 DIAGNOSIS — L409 Psoriasis, unspecified: Secondary | ICD-10-CM | POA: Diagnosis not present

## 2021-06-16 DIAGNOSIS — M7989 Other specified soft tissue disorders: Secondary | ICD-10-CM | POA: Diagnosis not present

## 2021-06-16 MED ORDER — RIVAROXABAN (XARELTO) VTE STARTER PACK (15 & 20 MG): ORAL_TABLET | ORAL | 0 refills | Status: DC

## 2021-06-16 NOTE — Progress Notes (Signed)
[facility] at [facility] [address] [place], [place] [address] [phone]  Date:  06/16/2021   Name:  Randall Scott   DOB:  Jan 02, 1951   MRN:  [id number]  PCP:  Darreld Mclean, MD    Chief Complaint: Leg Pain (Hx of superficial venous thrombosis of lower extremity. Pt is scheduled for imaging today. )   History of Present Illness:  Randall Scott is a 70 y.o. very pleasant male patient who presents with the following:  Last visit with myself in October- pt contacted me today with concern of possible recurrent DVT   Pt with history of Factor 5 leiden and DVT in 2018- he was treated with xarelto in the past but now just on baby aspirin  He took xarelto for about one year per his recollection Also history of prediabetes, psoriasis/ psoriatic arthritis   His psoriatic arthritis is managed by Dr. Trudie Reed at [facility] rheumatology Symponi Donalda Ewings and methotrexate once a week  The Symponi is done every 2 months   He noted some discomfort in the dorsum of his right foot yesterday.  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   Headache(784.0) 08/21/2013   Lateral epicondylitis of right elbow 04/01/2013    Past Medical History:  Diagnosis Date   Allergy    Arthritis    Asthma    Cataract     Past Surgical History:  Procedure Laterality Date   EYE SURGERY     HERNIA REPAIR     PERIPHERAL VASCULAR THROMBECTOMY Right    RLE    Social History   Tobacco Use   Smoking status: Never   Smokeless tobacco: Never   Substance Use Topics   Alcohol use: No   Drug use: No    Family History  Problem Relation Age of Onset   Lung cancer Mother    Lung cancer Father    Sudden death Neg Hx    Hypertension Neg Hx    Hyperlipidemia Neg Hx    Heart attack Neg Hx    Diabetes Neg Hx     No Known Allergies  Medication list has been reviewed and updated.  Current Outpatient Medications on File Prior to Visit  Medication Sig Dispense Refill   aspirin 81 MG chewable tablet Chew 162 mg by mouth daily.     folic acid (FOLVITE) 1 MG tablet Take 1 mg by mouth daily.     golimumab (SIMPONI ARIA) 50 MG/4ML SOLN injection See admin instructions.     methotrexate 2.5 MG tablet Take by mouth once a week.     omeprazole (PRILOSEC) 40 MG capsule Take 40 mg by mouth daily.     No current facility-administered medications on file prior to visit.    Review of Systems:  As per HPI- otherwise negative.   Physical Examination: Vitals:   06/16/21 1436  BP: 110/70  Pulse: 85  Resp: 19  SpO2: 95%   There were no vitals filed for this visit. There is no height or  weight on file to calculate BMI. Ideal Body Weight:    GEN: no acute distress.  Looks well, no distress HEENT: Atraumatic, Normocephalic.  Ears and Nose: No external deformity. CV: RRR, No M/G/R. No JVD. No thrill. No extra heart sounds. PULM: CTA B, no wheezes, crackles, rhonchi. No retractions. No resp. distress. No accessory muscle use. EXTR: No c/c/e PSYCH: Normally interactive. Conversant.  Right leg: The right ankle and shin display 1-2+ edema with tenderness.  The foot is not swollen, normal pulses in the foot  Assessment and Plan: Localized swelling of right lower leg  Pt seen today with concern of recurrent DVT Exam is suspicious for DVT.  Patient is set up for an ultrasound immediately following our visit   Signed Lamar Blinks, MD  Received [place] report Called patient at approximately 7 PM.  I discussed with radiology, there is  suspicion of blood clot of the reports as phlebitis.  Given extension of clot to the thigh, known factor V Leiden mutation and history of previous DVT will start on treatment dose of Xarelto  Called in starter pack for patient, advised him to start this ASAP and hold aspirin while taking Xarelto  He has seen hematology in the past, Dr. Marin Olp.  We will arrange follow-up for him after the holidays to discuss duration of therapy  Jacson is asked to contact me if any worsening or if not getting better US Venous Img Lower Unilateral Right  Result Date: 06/16/2021 CLINICAL DATA:  Pain and swelling EXAM: Right LOWER EXTREMITY VENOUS DOPPLER ULTRASOUND TECHNIQUE: Gray-scale sonography with compression, as well as color and duplex ultrasound, were performed to evaluate the deep venous system(s) from the level of the common femoral vein through the popliteal and proximal calf veins. COMPARISON:  None. FINDINGS: VENOUS Normal compressibility of the common femoral, superficial femoral, and popliteal veins, as well as the visualized calf veins. There are intraluminal echoes in the portions of greater saphenous vein. There are intraluminal echoes in the lesser saphenous vein in the calf. No filling defects to suggest DVT on grayscale or color Doppler imaging. Doppler waveforms show normal direction of venous flow, normal respiratory plasticity and response to augmentation. Limited views of the contralateral common femoral vein are unremarkable. OTHER None. Limitations: none IMPRESSION: There is no evidence acute DVT in the right lower extremity. Acute/chronic superficial phlebitis is noted involving lesser saphenous vein in the calf and greater saphenous vein in the thigh. Electronically Signed   By: Elmer Picker M.D.   On: 06/16/2021 15:54

## 2021-06-17 ENCOUNTER — Telehealth: Payer: Self-pay | Admitting: Family Medicine

## 2021-06-17 ENCOUNTER — Encounter: Payer: Self-pay | Admitting: Family Medicine

## 2021-06-17 DIAGNOSIS — R7989 Other specified abnormal findings of blood chemistry: Secondary | ICD-10-CM | POA: Diagnosis not present

## 2021-06-17 DIAGNOSIS — L4050 Arthropathic psoriasis, unspecified: Secondary | ICD-10-CM | POA: Diagnosis not present

## 2021-06-17 DIAGNOSIS — Z79899 Other long term (current) drug therapy: Secondary | ICD-10-CM | POA: Diagnosis not present

## 2021-06-17 NOTE — Telephone Encounter (Signed)
Pt called and verbal given.

## 2021-06-17 NOTE — Telephone Encounter (Signed)
[facility] called and stated they do not have the Xarelto starter pack They want to see can it be split into 2 different rxs.  Xarelto 15mg  2xs a day Xarelto 20mg  1x a day  [facility]  [address] [place], [place] - [place], [place] [address]  Phone:  [phone]  Fax:  [phone]

## 2021-07-05 ENCOUNTER — Encounter: Payer: Self-pay | Admitting: Family Medicine

## 2021-07-05 DIAGNOSIS — L814 Other melanin hyperpigmentation: Secondary | ICD-10-CM | POA: Diagnosis not present

## 2021-07-05 DIAGNOSIS — L570 Actinic keratosis: Secondary | ICD-10-CM | POA: Diagnosis not present

## 2021-07-05 DIAGNOSIS — G548 Other nerve root and plexus disorders: Secondary | ICD-10-CM | POA: Diagnosis not present

## 2021-07-05 DIAGNOSIS — L821 Other seborrheic keratosis: Secondary | ICD-10-CM | POA: Diagnosis not present

## 2021-07-05 DIAGNOSIS — R140 Abdominal distension (gaseous): Secondary | ICD-10-CM

## 2021-07-05 DIAGNOSIS — X32XXXS Exposure to sunlight, sequela: Secondary | ICD-10-CM | POA: Diagnosis not present

## 2021-07-12 ENCOUNTER — Ambulatory Visit (INDEPENDENT_AMBULATORY_CARE_PROVIDER_SITE_OTHER): Payer: Medicare PPO

## 2021-07-12 ENCOUNTER — Other Ambulatory Visit: Payer: Self-pay

## 2021-07-12 DIAGNOSIS — Z0000 Encounter for general adult medical examination without abnormal findings: Secondary | ICD-10-CM

## 2021-07-12 NOTE — Patient Instructions (Addendum)
Randall Scott , Thank you for taking time to come for your Medicare Wellness Visit. I appreciate your ongoing commitment to your health goals. Please review the following plan we discussed and let me know if I can assist you in the future.   Screening recommendations/referrals: Colonoscopy: Done 03/05/13 repeat every 10 years  Recommended yearly ophthalmology/optometry visit for glaucoma screening and checkup Recommended yearly dental visit for hygiene and checkup  Vaccinations: Influenza vaccine: Done 04/25/21 Pneumococcal vaccine: Up to date Tdap vaccine: Done 12/26/12 repeat every 10 years  Shingles vaccine: Completed 10/24, 07/01/18  Covid-19: Completed 2/2, 2/20, & 03/03/20  Advanced directives: copies in chart  Conditions/risks identified: live long  Next appointment: Follow up in one year for your annual wellness visit.   Preventive Care 71 Years and Older, Male Preventive care refers to lifestyle choices and visits with your health care provider that can promote health and wellness. What does preventive care include? A yearly physical exam. This is also called an annual well check. Dental exams once or twice a year. Routine eye exams. Ask your health care provider how often you should have your eyes checked. Personal lifestyle choices, including: Daily care of your teeth and gums. Regular physical activity. Eating a healthy diet. Avoiding tobacco and drug use. Limiting alcohol use. Practicing safe sex. Taking low doses of aspirin every day. Taking vitamin and mineral supplements as recommended by your health care provider. What happens during an annual well check? The services and screenings done by your health care provider during your annual well check will depend on your age, overall health, lifestyle risk factors, and family history of disease. Counseling  Your health care provider may ask you questions about your: Alcohol use. Tobacco use. Drug use. Emotional  well-being. Home and relationship well-being. Sexual activity. Eating habits. History of falls. Memory and ability to understand (cognition). Work and work Statistician. Screening  You may have the following tests or measurements: Height, weight, and BMI. Blood pressure. Lipid and cholesterol levels. These may be checked every 5 years, or more frequently if you are over 74 years old. Skin check. Lung cancer screening. You may have this screening every year starting at age 9 if you have a 30-pack-year history of smoking and currently smoke or have quit within the past 15 years. Fecal occult blood test (FOBT) of the stool. You may have this test every year starting at age 69. Flexible sigmoidoscopy or colonoscopy. You may have a sigmoidoscopy every 5 years or a colonoscopy every 10 years starting at age 69. Prostate cancer screening. Recommendations will vary depending on your family history and other risks. Hepatitis C blood test. Hepatitis B blood test. Sexually transmitted disease (STD) testing. Diabetes screening. This is done by checking your blood sugar (glucose) after you have not eaten for a while (fasting). You may have this done every 1-3 years. Abdominal aortic aneurysm (AAA) screening. You may need this if you are a current or former smoker. Osteoporosis. You may be screened starting at age 101 if you are at high risk. Talk with your health care provider about your test results, treatment options, and if necessary, the need for more tests. Vaccines  Your health care provider may recommend certain vaccines, such as: Influenza vaccine. This is recommended every year. Tetanus, diphtheria, and acellular pertussis (Tdap, Td) vaccine. You may need a Td booster every 10 years. Zoster vaccine. You may need this after age 35. Pneumococcal 13-valent conjugate (PCV13) vaccine. One dose is recommended after age 39.  Pneumococcal polysaccharide (PPSV23) vaccine. One dose is recommended after  age 67. Talk to your health care provider about which screenings and vaccines you need and how often you need them. This information is not intended to replace advice given to you by your health care provider. Make sure you discuss any questions you have with your health care provider. Document Released: 07/16/2015 Document Revised: 03/08/2016 Document Reviewed: 04/20/2015 Elsevier Interactive Patient Education  2017 [employer] Prevention in the Home Falls can cause injuries. They can happen to people of all ages. There are many things you can do to make your home safe and to help prevent falls. What can I do on the outside of my home? Regularly fix the edges of walkways and driveways and fix any cracks. Remove anything that might make you trip as you walk through a door, such as a raised step or threshold. Trim any bushes or trees on the path to your home. Use bright outdoor lighting. Clear any walking paths of anything that might make someone trip, such as rocks or tools. Regularly check to see if handrails are loose or broken. Make sure that both sides of any steps have handrails. Any raised decks and porches should have guardrails on the edges. Have any leaves, snow, or ice cleared regularly. Use sand or salt on walking paths during winter. Clean up any spills in your garage right away. This includes oil or grease spills. What can I do in the bathroom? Use night lights. Install grab bars by the toilet and in the tub and shower. Do not use towel bars as grab bars. Use non-skid mats or decals in the tub or shower. If you need to sit down in the shower, use a plastic, non-slip stool. Keep the floor dry. Clean up any water that spills on the floor as soon as it happens. Remove soap buildup in the tub or shower regularly. Attach bath mats securely with double-sided non-slip rug tape. Do not have throw rugs and other things on the floor that can make you trip. What can I do in the  bedroom? Use night lights. Make sure that you have a light by your bed that is easy to reach. Do not use any sheets or blankets that are too big for your bed. They should not hang down onto the floor. Have a firm chair that has side arms. You can use this for support while you get dressed. Do not have throw rugs and other things on the floor that can make you trip. What can I do in the kitchen? Clean up any spills right away. Avoid walking on wet floors. Keep items that you use a lot in easy-to-reach places. If you need to reach something above you, use a strong step stool that has a grab bar. Keep electrical cords out of the way. Do not use floor polish or wax that makes floors slippery. If you must use wax, use non-skid floor wax. Do not have throw rugs and other things on the floor that can make you trip. What can I do with my stairs? Do not leave any items on the stairs. Make sure that there are handrails on both sides of the stairs and use them. Fix handrails that are broken or loose. Make sure that handrails are as long as the stairways. Check any carpeting to make sure that it is firmly attached to the stairs. Fix any carpet that is loose or worn. Avoid having throw rugs at the  top or bottom of the stairs. If you do have throw rugs, attach them to the floor with carpet tape. Make sure that you have a light switch at the top of the stairs and the bottom of the stairs. If you do not have them, ask someone to add them for you. What else can I do to help prevent falls? Wear shoes that: Do not have high heels. Have rubber bottoms. Are comfortable and fit you well. Are closed at the toe. Do not wear sandals. If you use a stepladder: Make sure that it is fully opened. Do not climb a closed stepladder. Make sure that both sides of the stepladder are locked into place. Ask someone to hold it for you, if possible. Clearly mark and make sure that you can see: Any grab bars or  handrails. First and last steps. Where the edge of each step is. Use tools that help you move around (mobility aids) if they are needed. These include: Canes. Walkers. Scooters. Crutches. Turn on the lights when you go into a dark area. Replace any light bulbs as soon as they burn out. Set up your furniture so you have a clear path. Avoid moving your furniture around. If any of your floors are uneven, fix them. If there are any pets around you, be aware of where they are. Review your medicines with your doctor. Some medicines can make you feel dizzy. This can increase your chance of falling. Ask your doctor what other things that you can do to help prevent falls. This information is not intended to replace advice given to you by your health care provider. Make sure you discuss any questions you have with your health care provider. Document Released: 04/15/2009 Document Revised: 11/25/2015 Document Reviewed: 07/24/2014 Elsevier Interactive Patient Education  2017 [employer].

## 2021-07-12 NOTE — Progress Notes (Addendum)
Virtual Visit via Telephone Note  I connected with  Randall Scott on 07/12/21 at  2:30 PM EST by telephone and verified that I am speaking with the correct person using two identifiers.  Medicare Annual Wellness visit completed telephonically due to Covid-19 pandemic.   Persons participating in this call: This Health Coach and this patient.   Location: Patient: home  Provider: office   I discussed the limitations, risks, security and privacy concerns of performing an evaluation and management service by telephone and the availability of in person appointments. The patient expressed understanding and agreed to proceed.  Unable to perform video visit due to video visit attempted and failed and/or patient does not have video capability.   Some vital signs may be absent or patient reported.   Willette Brace, LPN   Subjective:   Randall Scott is a 71 y.o. male who presents for Medicare Annual/Subsequent preventive examination.  Review of Systems           Objective:    There were no vitals filed for this visit. There is no height or weight on file to calculate BMI.  Advanced Directives 07/12/2021 01/06/2021 04/14/2019 08/19/2018 04/11/2018 02/15/2018 04/13/2017  Does Patient Have a Medical Advance Directive? Yes Yes Yes Yes Yes Yes Yes  Type of Paramedic of [place];Living will [place];Living will Living will;Healthcare Power of [place];Living will Living will [place];Living will  Does patient want to make changes to medical advance directive? - - No - Patient declined - - - -  Copy of [place] in Chart? Yes - validated most recent copy scanned in chart (See row information) - No - copy requested - No - copy requested - -    Current Medications (verified) Outpatient Encounter Medications as of 07/12/2021  Medication Sig   folic acid (FOLVITE)  1 MG tablet Take 1 mg by mouth daily.   golimumab (SIMPONI ARIA) 50 MG/4ML SOLN injection See admin instructions.   methotrexate 2.5 MG tablet Take by mouth once a week.   omeprazole (PRILOSEC) 40 MG capsule Take 40 mg by mouth daily.   RIVAROXABAN (XARELTO) VTE STARTER PACK (15 & 20 MG) Follow package directions: Take one 15mg  tablet by mouth twice a day. On day 22, switch to one 20mg  tablet once a day. Take with food. (Patient taking differently: 20 mg/day. Follow package directions: Take one 15mg  tablet by mouth twice a day. On day 22, switch to one 20mg  tablet once a day. Take with food.)   aspirin 81 MG chewable tablet Chew 162 mg by mouth daily. (Patient not taking: Reported on 07/12/2021)   No facility-administered encounter medications on file as of 07/12/2021.    Allergies (verified) Patient has no known allergies.   History: Past Medical History:  Diagnosis Date   Allergy    Arthritis    Asthma    Cataract    Past Surgical History:  Procedure Laterality Date   EYE SURGERY     HERNIA REPAIR     PERIPHERAL VASCULAR THROMBECTOMY Right    RLE   Family History  Problem Relation Age of Onset   Lung cancer Mother    Lung cancer Father    Sudden death Neg Hx    Hypertension Neg Hx    Hyperlipidemia Neg Hx    Heart attack Neg Hx    Diabetes Neg Hx    Social History  Socioeconomic History   Marital status: Married    Spouse name: Randall Scott   Number of children: 2   Years of education: 12   Highest education level: Not on file  Occupational History   Occupation: Information systems manager: [employer]  Tobacco Use   Smoking status: Never   Smokeless tobacco: Never  Substance and Sexual Activity   Alcohol use: No   Drug use: No   Sexual activity: Yes    Birth control/protection: None    Comment: married  Other Topics Concern   Not on file  Social History Narrative   Lives with his wife.  Daughters are grown and live nearby.   Patient is right handed    Education level is 12   Caffeine consumption is 2-3 cups daily   Social Determinants of Health   Financial Resource Strain: Low Risk    Difficulty of Paying Living Expenses: Not hard at all  Food Insecurity: No Food Insecurity   Worried About Charity fundraiser in the Last Year: Never true   Ran Out of Food in the Last Year: Never true  Transportation Needs: No Transportation Needs   Lack of Transportation (Medical): No   Lack of Transportation (Non-Medical): No  Physical Activity: Sufficiently Active   Days of Exercise per Week: 4 days   Minutes of Exercise per Session: 60 min  Stress: No Stress Concern Present   Feeling of Stress : Not at all  Social Connections: Moderately Integrated   Frequency of Communication with Friends and Family: Three times a week   Frequency of Social Gatherings with Friends and Family: More than three times a week   Attends Religious Services: More than 4 times per year   Active Member of [employer] or Organizations: No   Attends Music therapist: Never   Marital Status: Married    Tobacco Counseling Counseling given: Not Answered   Clinical Intake:  Pre-visit preparation completed: Yes  Pain : No/denies pain     Nutritional Risks: None Diabetes: No  How often do you need to have someone help you when you read instructions, pamphlets, or other written materials from your doctor or pharmacy?: 1 - Never  Diabetic?No  Interpreter Needed?: No  Information entered by :: Charlott Rakes, LPN   Activities of Daily Living In your present state of health, do you have any difficulty performing the following activities: 07/12/2021  Hearing? Y  Comment hearing aids  Vision? N  Difficulty concentrating or making decisions? N  Walking or climbing stairs? N  Dressing or bathing? N  Doing errands, shopping? N  Preparing Food and eating ? N  Using the Toilet? N  In the past six months, have you accidently leaked urine? N  Do you have  problems with loss of bowel control? N  Managing your Medications? N  Managing your Finances? N  Housekeeping or managing your Housekeeping? N  Some recent data might be hidden    Patient Care Team: Copland, Gay Filler, MD as PCP - General (Family Medicine) Maylon Peppers, MD (Family Medicine) Marin Olp Rudell Cobb, MD as Consulting Physician (Oncology) Clark-Burning, Anderson Malta, PA-C (Inactive) (Dermatology)  Indicate any recent Medical Services you may have received from other than [facility] providers in the past year (date may be approximate).     Assessment:   This is a routine wellness examination for [place].  Hearing/Vision screen Hearing Screening - Comments:: Pt wears hearing aids  Vision Screening - Comments:: Pt follows  up family eye care in [place] for annual eye exams   Dietary issues and exercise activities discussed: Current Exercise Habits: Home exercise routine, Type of exercise: Other - see comments, Time (Minutes): 60   Goals Addressed             This Visit's Progress    Patient Stated       Live long       Depression Screen PHQ 2/9 Scores 07/12/2021 04/25/2021 04/22/2020 04/14/2019 04/11/2018 04/09/2017 04/06/2016  PHQ - 2 Score 0 0 0 0 0 0 0  Exception Documentation - - - - - Patient refusal -    Fall Risk Fall Risk  07/12/2021 04/25/2021 04/22/2020 04/14/2019 04/11/2018  Falls in the past year? 0 0 0 0 No  Number falls in past yr: 0 0 0 0 -  Injury with Fall? 0 0 0 0 -  Risk for fall due to : Impaired vision - - - -  Follow up Falls prevention discussed - - - -    FALL RISK PREVENTION PERTAINING TO THE HOME:  Any stairs in or around the home? No  If so, are there any without handrails? No  Home free of loose throw rugs in walkways, pet beds, electrical cords, etc? Yes  Adequate lighting in your home to reduce risk of falls? Yes   ASSISTIVE DEVICES UTILIZED TO PREVENT FALLS:  Life alert? No  Use of a cane, walker or w/c? No  Grab bars in the  bathroom? No  Shower chair or bench in shower? No  Elevated toilet seat or a handicapped toilet? No   TIMED UP AND GO:  Was the test performed? No .   Cognitive Function: MMSE - Mini Mental State Exam 04/09/2017  Orientation to time 5  Orientation to Place 5  Registration 3  Attention/ Calculation 5  Recall 3  Language- name 2 objects 2  Language- repeat 1  Language- follow 3 step command 3  Language- read & follow direction 1  Write a sentence 1  Copy design 1  Total score 30     6CIT Screen 07/12/2021  What Year? 0 points  What month? 0 points  What time? 0 points  Count back from 20 0 points  Months in reverse 0 points  Repeat phrase 0 points  Total Score 0    Immunizations Immunization History  Administered Date(s) Administered   Fluad Quad(high Dose 65+) 03/16/2019, 04/22/2020, 04/25/2021   Influenza Split 07/04/2006   Influenza, High Dose Seasonal PF 04/06/2016, 04/09/2017, 04/11/2018   Influenza,inj,Quad PF,6+ Mos 03/25/2013, 04/21/2014, 05/10/2015   PFIZER(Purple Top)SARS-COV-2 Vaccination 08/05/2019, 08/23/2019, 03/03/2020   Pneumococcal Conjugate-13 04/06/2016   Pneumococcal Polysaccharide-23 04/09/2017   Tdap 12/26/2012   Zoster Recombinat (Shingrix) 04/25/2018, 07/01/2018    TDAP status: Up to date  Flu Vaccine status: Up to date  Pneumococcal vaccine status: Up to date  Covid-19 vaccine status: Completed vaccines  Qualifies for Shingles Vaccine? Yes   Zostavax completed Yes   Shingrix Completed?: Yes  Screening Tests Health Maintenance  Topic Date Due   COVID-19 Vaccine (4 - Booster for Pfizer series) 04/28/2020   TETANUS/TDAP  12/27/2022   COLONOSCOPY (Pts 45-33yrs Insurance coverage will need to be confirmed)  03/06/2023   Pneumonia Vaccine 44+ Years old  Completed   INFLUENZA VACCINE  Completed   Hepatitis C Screening  Completed   Zoster Vaccines- Shingrix  Completed   HPV VACCINES  Aged Out    Health Maintenance  Health  Maintenance Due  Topic Date Due   COVID-19 Vaccine (4 - Booster for Pfizer series) 04/28/2020     Colorectal cancer screening: Type of screening: Colonoscopy. Completed 03/05/13. Repeat every 10 years    Additional Screening:  Hepatitis C Screening:  Completed 03/10/20  Vision Screening: Recommended annual ophthalmology exams for early detection of glaucoma and other disorders of the eye. Is the patient up to date with their annual eye exam?  Yes  Who is the provider or what is the name of the office in which the patient attends annual eye exams? [facility]  If pt is not established with a provider, would they like to be referred to a provider to establish care? No .   Dental Screening: Recommended annual dental exams for proper oral hygiene  Community Resource Referral / Chronic Care Management: CRR required this visit?  No   CCM required this visit?  No      Plan:     I have personally reviewed and noted the following in the patients chart:   Medical and social history Use of alcohol, tobacco or illicit drugs  Current medications and supplements including opioid prescriptions. Patient is not currently taking opioid prescriptions. Functional ability and status Nutritional status Physical activity Advanced directives List of other physicians Hospitalizations, surgeries, and ER visits in previous 12 months Vitals Screenings to include cognitive, depression, and falls Referrals and appointments  In addition, I have reviewed and discussed with patient certain preventive protocols, quality metrics, and best practice recommendations. A written personalized care plan for preventive services as well as general preventive health recommendations were provided to patient.     Willette Brace, LPN   12/31/1599   Nurse Notes: None

## 2021-07-13 ENCOUNTER — Encounter: Payer: Self-pay | Admitting: Family Medicine

## 2021-07-13 ENCOUNTER — Other Ambulatory Visit: Payer: Self-pay | Admitting: Family Medicine

## 2021-07-13 DIAGNOSIS — D6851 Activated protein C resistance: Secondary | ICD-10-CM

## 2021-07-13 DIAGNOSIS — I82811 Embolism and thrombosis of superficial veins of right lower extremities: Secondary | ICD-10-CM

## 2021-07-13 MED ORDER — RIVAROXABAN 20 MG PO TABS: 20.0000 mg | ORAL_TABLET | Freq: Every day | ORAL | 3 refills | Status: DC

## 2021-07-13 NOTE — Progress Notes (Signed)
Pts wife asked me about his Xarelto today as he is about out of his starter pack which we began in December for blood clot in his leg  This is a recurrent clot, history of factor V Leiden  I have refilled Xarelto today, will ask patient to get in touch with hematology for follow-up recommendations

## 2021-07-14 ENCOUNTER — Ambulatory Visit: Payer: Medicare PPO | Admitting: Gastroenterology

## 2021-07-25 ENCOUNTER — Ambulatory Visit: Payer: Medicare PPO | Admitting: Hematology & Oncology

## 2021-07-25 ENCOUNTER — Inpatient Hospital Stay: Payer: Medicare PPO

## 2021-07-27 ENCOUNTER — Inpatient Hospital Stay: Payer: Medicare PPO | Admitting: Hematology & Oncology

## 2021-07-27 ENCOUNTER — Other Ambulatory Visit: Payer: Self-pay

## 2021-07-27 ENCOUNTER — Encounter: Payer: Self-pay | Admitting: Hematology & Oncology

## 2021-07-27 ENCOUNTER — Inpatient Hospital Stay: Payer: Medicare PPO | Attending: Family Medicine

## 2021-07-27 VITALS — BP 120/89 | HR 69 | Temp 98.0°F | Resp 20 | Ht 68.0 in | Wt 178.8 lb

## 2021-07-27 DIAGNOSIS — D6851 Activated protein C resistance: Secondary | ICD-10-CM | POA: Diagnosis not present

## 2021-07-27 DIAGNOSIS — Z79899 Other long term (current) drug therapy: Secondary | ICD-10-CM | POA: Diagnosis not present

## 2021-07-27 DIAGNOSIS — M069 Rheumatoid arthritis, unspecified: Secondary | ICD-10-CM | POA: Insufficient documentation

## 2021-07-27 DIAGNOSIS — Z7901 Long term (current) use of anticoagulants: Secondary | ICD-10-CM | POA: Diagnosis not present

## 2021-07-27 DIAGNOSIS — Z7982 Long term (current) use of aspirin: Secondary | ICD-10-CM | POA: Insufficient documentation

## 2021-07-27 DIAGNOSIS — Z86718 Personal history of other venous thrombosis and embolism: Secondary | ICD-10-CM | POA: Insufficient documentation

## 2021-07-27 LAB — CBC WITH DIFFERENTIAL (CANCER CENTER ONLY)
Abs Immature Granulocytes: 0.02 10*3/uL (ref 0.00–0.07)
Basophils Absolute: 0.1 10*3/uL (ref 0.0–0.1)
Basophils Relative: 1 %
Eosinophils Absolute: 0.2 10*3/uL (ref 0.0–0.5)
Eosinophils Relative: 4 %
HCT: 40.3 % (ref 39.0–52.0)
Hemoglobin: 13.7 g/dL (ref 13.0–17.0)
Immature Granulocytes: 0 %
Lymphocytes Relative: 35 %
Lymphs Abs: 2.1 10*3/uL (ref 0.7–4.0)
MCH: 34 pg (ref 26.0–34.0)
MCHC: 34 g/dL (ref 30.0–36.0)
MCV: 100 fL (ref 80.0–100.0)
Monocytes Absolute: 0.3 10*3/uL (ref 0.1–1.0)
Monocytes Relative: 5 %
Neutro Abs: 3.2 10*3/uL (ref 1.7–7.7)
Neutrophils Relative %: 55 %
Platelet Count: 250 10*3/uL (ref 150–400)
RBC: 4.03 MIL/uL — ABNORMAL LOW (ref 4.22–5.81)
RDW: 13 % (ref 11.5–15.5)
WBC Count: 5.8 10*3/uL (ref 4.0–10.5)
nRBC: 0 % (ref 0.0–0.2)

## 2021-07-27 LAB — CMP (CANCER CENTER ONLY)
ALT: 16 U/L (ref 0–44)
AST: 19 U/L (ref 15–41)
Albumin: 4.3 g/dL (ref 3.5–5.0)
Alkaline Phosphatase: 51 U/L (ref 38–126)
Anion gap: 6 (ref 5–15)
BUN: 14 mg/dL (ref 8–23)
CO2: 28 mmol/L (ref 22–32)
Calcium: 9.9 mg/dL (ref 8.9–10.3)
Chloride: 107 mmol/L (ref 98–111)
Creatinine: 1.25 mg/dL — ABNORMAL HIGH (ref 0.61–1.24)
GFR, Estimated: >60 mL/min (ref >60)
Glucose, Bld: 132 mg/dL — ABNORMAL HIGH (ref 70–99)
Potassium: 4 mmol/L (ref 3.5–5.1)
Sodium: 141 mmol/L (ref 135–145)
Total Bilirubin: 0.6 mg/dL (ref 0.3–1.2)
Total Protein: 6.5 g/dL (ref 6.5–8.1)

## 2021-07-27 LAB — D-DIMER, QUANTITATIVE: D-Dimer, Quant: <0.27 ug/mL-FEU (ref 0.00–0.50)

## 2021-07-27 NOTE — Progress Notes (Signed)
Hematology and Oncology Follow Up Visit  Randall Scott [id number] January 17, 1951 71 y.o. 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 daily-maintenance therapy for 1 year-complete in August 2020 EC ASA 162 mg po q day -- start on 02/17/2019 --D/C on 06/16/2021 Xarelto 20 mg p.o. daily-6 weeks to finish up on 08/22/2021      Interim History:  Randall Scott is back for follow-up.  We have not seen for about a couple years.  He been doing quite well.  Over last saw him, we had him on aspirin.  He is on 2 baby aspirin a day.  In December 2022, he has some pain in the right lower leg.  He called his family doctor, Dr. Lorelei Pont.  As always, she is very thorough.  She went and ordered a Doppler.  There is no thrombus in the right leg.  However, there was what appeared to be some thrombophlebitis.  She went ahead and stopped his aspirin and put him on full dose Xarelto.  We now are seeing him back.  Otherwise he is doing nicely.  He is having no problems with cough or shortness of breath.  He has had no problems with COVID.  He has had no issues with nausea or vomiting.  There is no change in bowel or bladder habits.  He has had no problems with rashes..  Does have rheumatoid arthritis.  I wonder if this thrombophlebitis may be a manifestation of the rheumatism.  He has had no weight loss or weight gain.  Overall, I would say his performance status is ECOG 0.  Medications:  Current Outpatient Medications:    folic acid (FOLVITE) 229 MCG tablet, Take 1,600 mcg by mouth daily., Disp: , Rfl:    golimumab (SIMPONI ARIA) 50 MG/4ML SOLN injection, every 8 (eight) weeks., Disp: , Rfl:    methotrexate 2.5 MG tablet, Take by mouth once a week., Disp: , Rfl:     omeprazole (PRILOSEC) 40 MG capsule, Take 40 mg by mouth daily., Disp: , Rfl:    rivaroxaban (XARELTO) 20 MG TABS tablet, Take 1 tablet (20 mg total) by mouth daily with supper., Disp: 90 tablet, Rfl: 3   aspirin 81 MG chewable tablet, Chew 162 mg by mouth daily. (Patient not taking: Reported on 07/12/2021), Disp: , Rfl:   Allergies: No Known Allergies  Past Medical History, Surgical history, Social history, and Family History were reviewed and updated.  Review of Systems: Review of Systems  Constitutional:  Negative for appetite change, fatigue, fever and unexpected weight change.  HENT:   Negative for lump/mass, mouth sores, sore throat and trouble swallowing.   Respiratory:  Negative for cough, hemoptysis and shortness of breath.   Cardiovascular:  Negative for leg swelling and palpitations.  Gastrointestinal:  Negative for abdominal distention, abdominal pain, blood in stool, constipation, diarrhea, nausea and vomiting.  Genitourinary:  Negative for bladder incontinence, dysuria, frequency and hematuria.   Musculoskeletal:  Negative for arthralgias, back pain, gait problem and myalgias.  Skin:  Negative for itching and rash.  Neurological:  Negative for dizziness, extremity weakness, gait problem, headaches, numbness, seizures and speech difficulty.  Hematological:  Does not bruise/bleed easily.  Psychiatric/Behavioral:  Negative for depression and sleep disturbance. The patient is not nervous/anxious.  Physical Exam:  height is 5\' 8"  (1.727 m) and weight is 178 lb 12.8 oz (81.1 kg). His oral temperature is 98 F (36.7 C). His blood pressure is 120/89 and his pulse is 69. His respiration is 20 and oxygen saturation is 99%.   Wt Readings from Last 3 Encounters:  07/27/21 178 lb 12.8 oz (81.1 kg)  04/25/21 176 lb 3.2 oz (79.9 kg)  05/03/20 172 lb (78 kg)    Physical Exam Vitals reviewed.  HENT:     Head: Normocephalic and atraumatic.  Eyes:     Pupils: Pupils are equal, round,  and reactive to light.  Cardiovascular:     Rate and Rhythm: Normal rate and regular rhythm.     Heart sounds: Normal heart sounds.  Pulmonary:     Effort: Pulmonary effort is normal.     Breath sounds: Normal breath sounds.  Abdominal:     General: Bowel sounds are normal.     Palpations: Abdomen is soft.  Musculoskeletal:        General: No tenderness or deformity. Normal range of motion.     Cervical back: Normal range of motion.  Lymphadenopathy:     Cervical: No cervical adenopathy.  Skin:    General: Skin is warm and dry.     Findings: No erythema or rash.  Neurological:     Mental Status: He is alert and oriented to person, place, and time.  Psychiatric:        Behavior: Behavior normal.        Thought Content: Thought content normal.        Judgment: Judgment normal.     Lab Results  Component Value Date   WBC 5.8 07/27/2021   HGB 13.7 07/27/2021   HCT 40.3 07/27/2021   MCV 100.0 07/27/2021   PLT 250 07/27/2021     Chemistry      Component Value Date/Time   NA 142 04/25/2021 1107   NA 139 03/10/2020 0000   NA 147 (H) 04/13/2017 0936   K 5.1 04/25/2021 1107   K 4.4 04/13/2017 0936   CL 107 04/25/2021 1107   CL 108 04/13/2017 0936   CO2 28 04/25/2021 1107   CO2 33 04/13/2017 0936   BUN 17 04/25/2021 1107   BUN 16 03/10/2020 0000   BUN 13 04/13/2017 0936   CREATININE 1.18 04/25/2021 1107   CREATININE 1.30 (H) 08/18/2019 1002   CREATININE 1.5 (H) 04/13/2017 0936   GLU 83 03/10/2020 0000      Component Value Date/Time   CALCIUM 9.4 04/25/2021 1107   CALCIUM 10.0 04/13/2017 0936   ALKPHOS 48 04/25/2021 1107   ALKPHOS 51 04/13/2017 0936   AST 17 04/25/2021 1107   AST 17 08/18/2019 1002   ALT 12 04/25/2021 1107   ALT 12 08/18/2019 1002   ALT 34 04/13/2017 0936   BILITOT 0.7 04/25/2021 1107   BILITOT 0.6 08/18/2019 1002      Impression and Plan: Randall Scott is a 71 year old male. He has a Factor V Leiden mutation. He is heterozygous for this.  I am  not sure if this thrombophlebitis warrants him being on lifelong anticoagulation.  I think that we can have him on 6 weeks of full dose Xarelto.  I would then plan for follow-up Doppler to see how this thrombophlebitis looks.  Looks fine, that we will probably get back onto aspirin.  Again, there is no thrombus.  Again the thrombophlebitis might be an extra-articular manifestation of the his  rheumatoid arthritis.  It was nice to see him again.  He is also healthy.  It is always fun talking with him.  When we see him back in early March, we will get a Doppler the same day that I see him.   Volanda Napoleon, MD 1/25/20232:18 PM

## 2021-08-03 ENCOUNTER — Telehealth: Payer: Self-pay | Admitting: General Surgery

## 2021-08-03 ENCOUNTER — Other Ambulatory Visit: Payer: Self-pay

## 2021-08-03 ENCOUNTER — Encounter: Payer: Self-pay | Admitting: Gastroenterology

## 2021-08-03 ENCOUNTER — Ambulatory Visit (INDEPENDENT_AMBULATORY_CARE_PROVIDER_SITE_OTHER): Payer: Medicare PPO | Admitting: Gastroenterology

## 2021-08-03 VITALS — BP 110/78 | HR 70 | Ht 68.0 in | Wt 179.5 lb

## 2021-08-03 DIAGNOSIS — Z1211 Encounter for screening for malignant neoplasm of colon: Secondary | ICD-10-CM | POA: Diagnosis not present

## 2021-08-03 DIAGNOSIS — R6881 Early satiety: Secondary | ICD-10-CM | POA: Diagnosis not present

## 2021-08-03 DIAGNOSIS — R140 Abdominal distension (gaseous): Secondary | ICD-10-CM | POA: Diagnosis not present

## 2021-08-03 DIAGNOSIS — K219 Gastro-esophageal reflux disease without esophagitis: Secondary | ICD-10-CM

## 2021-08-03 DIAGNOSIS — R142 Eructation: Secondary | ICD-10-CM

## 2021-08-03 DIAGNOSIS — R1310 Dysphagia, unspecified: Secondary | ICD-10-CM | POA: Diagnosis not present

## 2021-08-03 MED ORDER — CLENPIQ 10-3.5-12 MG-GM -GM/160ML PO SOLN: 1.0000 | ORAL | 0 refills | Status: DC

## 2021-08-03 NOTE — Progress Notes (Signed)
Chief Complaint: Abdominal bloating   Referring Provider:     Darreld Mclean, MD   HPI:     Randall Scott is a 71 y.o. male with a history of factor V Leiden mutation, thromboembolic disease with DVT 2018, asthma, psoriatic arthritis (on golimumab and MTX), referred to the [facility]ology [facility] for evaluation of abdominal bloating.  States he has had abdominal bloating x3-4 months or so. Increased belching. +early satiety. Weight stable to increased a couple lbs. No change in bowel habits. No hematochezia, melena. No preceding Abx, changes in diet, new meds.   Separately, occasional solid food dysphagia, pointing to suprasternal notch. Has been present for years. Thinks he had an EGD years ago. No change in sxs recently. No reflux sxs.   Recent evaluation n/f:  -04/2021: MCV 102 (chronic elevation), otherwise normal CBC.  Normal CMP, A1c, lipase -05/2021: CT abdomen/pelvis: Small HH, diverticulosis, otherwise normal GI tract.  Normal liver, GB, pancreas, spleen - 07/2021: Normal CBC, CMP (creatinine 1.25)  Was recently started on Xarelto for concern of DVT (ultrasound with thrombophlebitis but no clot).  Was seen last month by Dr. Marin Olp in the hematology clinic, with plan to transition Xarelto back to aspirin after 6 weeks.  Separately, long history of GERD x3-4 years. Well controlled with omeprazole 40 mg/day.    Endoscopic History: - Colonoscopy (03/2013): Diverticulosis, otherwise normal - EGD years ago per patient. No report available for review   Past Medical History:  Diagnosis Date   Allergy    Arthritis    Asthma    Cataract      Past Surgical History:  Procedure Laterality Date   EYE SURGERY     HERNIA REPAIR     PERIPHERAL VASCULAR THROMBECTOMY Right    RLE   Family History  Problem Relation Age of Onset   Lung cancer Mother    Lung cancer Father    Sudden death Neg Hx    Hypertension Neg Hx    Hyperlipidemia Neg Hx    Heart attack  Neg Hx    Diabetes Neg Hx    Colon cancer Neg Hx    Rectal cancer Neg Hx    Esophageal cancer Neg Hx    Pancreatic cancer Neg Hx    Social History   Tobacco Use   Smoking status: Never   Smokeless tobacco: Never  Vaping Use   Vaping Use: Never used  Substance Use Topics   Alcohol use: No   Drug use: No   Current Outpatient Medications  Medication Sig Dispense Refill   folic acid (FOLVITE) 789 MCG tablet Take 1,600 mcg by mouth daily.     golimumab (SIMPONI ARIA) 50 MG/4ML SOLN injection every 8 (eight) weeks.     methotrexate 2.5 MG tablet Take by mouth once a week.     omeprazole (PRILOSEC) 40 MG capsule Take 40 mg by mouth daily.     rivaroxaban (XARELTO) 20 MG TABS tablet Take 1 tablet (20 mg total) by mouth daily with supper. 90 tablet 3   No current facility-administered medications for this visit.   No Known Allergies   Review of Systems: All systems reviewed and negative except where noted in HPI.     Physical Exam:    Wt Readings from Last 3 Encounters:  08/03/21 179 lb 8 oz (81.4 kg)  07/27/21 178 lb 12.8 oz (81.1 kg)  04/25/21 176 lb 3.2 oz (79.9 kg)  BP 110/78    Pulse 70    Ht 5\' 8"  (1.727 m)    Wt 179 lb 8 oz (81.4 kg)    SpO2 98%    BMI 27.29 kg/m  Constitutional:  Pleasant, in no acute distress. Psychiatric: Normal mood and affect. Behavior is normal. EENT: Pupils normal.  Conjunctivae are normal. No scleral icterus. Neck supple. No cervical LAD. Cardiovascular: Normal rate, regular rhythm. No edema Pulmonary/chest: Effort normal and breath sounds normal. No wheezing, rales or rhonchi. Abdominal: Soft, nondistended, nontender. Bowel sounds active throughout. There are no masses palpable. No hepatomegaly. Neurological: Alert and oriented to person place and time. Skin: Skin is warm and dry. No rashes noted.   ASSESSMENT AND PLAN;   1) Abdominal bloating 2) Belching 3) Early satiety 4) Dysphagia 5) hiatal hernia on CT 6) GERD - EGD to  evaluate for mucosal/luminal pathology - Discussed esophageal dilation and/or biopsies as appropriate - Continue omeprazole 40 mg/day - Check micronutrient panel given chronic PPI use - Discussed risks/benefits of continued PPI use - Evaluate for hiatal hernia size, LES laxity, and erosive esophagitis at time of EGD - Continue antireflux lifestyle/dietary modifications - Trial course of probiotics - May benefit from low FODMAP diet pending evaluation above  7) Colon cancer screening - Due for ongoing colon cancer screening within the next 12 months. - Schedule colonoscopy to be done at the same time as EGD as above  8) Factor V Leiden 9) Systemic anticoagulation -Hold Xarelto 2 days before procedure - will instruct when and how to resume after procedure. Low but real risk of cardiovascular event such as heart attack, stroke, embolism, thrombosis or ischemia/infarct of other organs off Xarelto explained and need to seek urgent help if this occurs. The patient consents to proceed. Will communicate by phone or EMR with patient's prescribing provider to confirm that holding Xarelto is reasonable in this case - Okay to use ASA 81 mg in the perioperative setting   The indications, risks, and benefits of EGD and colonoscopy were explained to the patient in detail. Risks include but are not limited to bleeding, perforation, adverse reaction to medications, and cardiopulmonary compromise. Sequelae include but are not limited to the possibility of surgery, hospitalization, and mortality. The patient verbalized understanding and wished to proceed. All questions answered, referred to scheduler and bowel prep ordered. Further recommendations pending results of the exam.    Lavena Bullion, DO, FACG  08/03/2021, 9:33 AM   Cop[place], Gay Filler, MD

## 2021-08-03 NOTE — Patient Instructions (Signed)
If you are age 71 or older, your body mass index should be between 23-30. Your Body mass index is 27.29 kg/m. If this is out of the aforementioned range listed, please consider follow up with your Primary Care Provider.  If you are age 64 or younger, your body mass index should be between 19-25. Your Body mass index is 27.29 kg/m. If this is out of the aformentioned range listed, please consider follow up with your Primary Care Provider.   __________________________________________________________  The [facility] providers would like to encourage you to use [facility] to communicate with providers for non-urgent requests or questions.  Due to long hold times on the telephone, sending your provider a message by [facility] may be a faster and more efficient way to get a response.  Please allow 48 business hours for a response.  Please remember that this is for non-urgent requests.    We have sent the following medications to your pharmacy for you to pick up at your convenience:  Clenpiq for your colonoscopy.  Please purchase the following medications over the counter and take as directed:  Please start on a probiotic  You will be contaced by our office prior to your procedure for directions on holding your Xarelto.  If you do not hear from our office 1 week prior to your scheduled procedure, please call [phone] to discuss.  Thank you for choosing me and [facility].  Vito Cirigliano, D.O.

## 2021-08-03 NOTE — Telephone Encounter (Signed)
 [facility] Pre-operative Risk Assessment     Request for surgical clearance:     Endoscopy Procedure  What type of surgery is being performed?     EGD/colonoscopy  When is this surgery scheduled?     08/30/2021  What type of clearance is required ?   Xarelto clearance  Are there any medications that need to be held prior to surgery and how long? Xarelto for 2 days  Practice name and name of physician performing surgery?      [facility]  What is your office phone and fax number?      Phone- [phone]  [phone]  Anesthesia type (None, local, MAC, general) ?       MAC

## 2021-08-04 ENCOUNTER — Encounter: Payer: Self-pay | Admitting: Family Medicine

## 2021-08-11 NOTE — Telephone Encounter (Signed)
Spoke with Randall Scott and explained to stop his Xarelto for 2 days before his procedure. The patient verbalized understanding.

## 2021-08-12 DIAGNOSIS — L4050 Arthropathic psoriasis, unspecified: Secondary | ICD-10-CM | POA: Diagnosis not present

## 2021-08-12 DIAGNOSIS — Z79899 Other long term (current) drug therapy: Secondary | ICD-10-CM | POA: Diagnosis not present

## 2021-08-16 ENCOUNTER — Encounter: Payer: Self-pay | Admitting: Gastroenterology

## 2021-08-19 ENCOUNTER — Encounter: Payer: Self-pay | Admitting: Family

## 2021-08-19 ENCOUNTER — Ambulatory Visit (INDEPENDENT_AMBULATORY_CARE_PROVIDER_SITE_OTHER): Payer: Medicare PPO | Admitting: Family

## 2021-08-19 VITALS — BP 110/70 | HR 73 | Temp 98.0°F | Ht 68.0 in | Wt 180.6 lb

## 2021-08-19 DIAGNOSIS — J019 Acute sinusitis, unspecified: Secondary | ICD-10-CM

## 2021-08-19 MED ORDER — DOXYCYCLINE HYCLATE 100 MG PO CAPS: 100.0000 mg | ORAL_CAPSULE | Freq: Two times a day (BID) | ORAL | 0 refills | Status: DC

## 2021-08-19 MED ORDER — AZELASTINE HCL 0.1 % NA SOLN: 2.0000 | Freq: Two times a day (BID) | NASAL | 12 refills | Status: DC

## 2021-08-19 NOTE — Progress Notes (Signed)
Randall Scott is a 71 y.o. 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 Use in each nostril as directed 30 mL 12   doxycycline (VIBRAMYCIN) 100 MG capsule Take 1 capsule (100 mg total) by mouth 2 (two) times daily. 20 capsule 0   folic acid (FOLVITE) 176 MCG tablet Take 1,600 mcg by mouth daily.     golimumab (SIMPONI ARIA) 50 MG/4ML SOLN injection every 8 (eight) weeks.     methotrexate 2.5 MG tablet Take by mouth once a week.     omeprazole (PRILOSEC) 40 MG capsule Take 40 mg by mouth daily.     rivaroxaban (XARELTO) 20 MG TABS tablet Take 1 tablet (20 mg total) by mouth daily with supper. 90 tablet 3   Sod Picosulfate-Mag Ox-Cit Acd (CLENPIQ) 10-3.5-12 MG-GM -GM/160ML SOLN Take 1 kit by mouth as directed. 320 mL 0   No current facility-administered medications for this visit.    Allergies: Patient has no known allergies.  Past Medical History:  Diagnosis Date   Allergy    Arthritis    Asthma    Cataract     Past Surgical History:  Procedure Laterality Date   EYE SURGERY     HERNIA REPAIR     PERIPHERAL VASCULAR THROMBECTOMY Right    RLE    Family History  Problem Relation Age of Onset   Lung cancer Mother    Lung cancer Father    Sudden death Neg Hx    Hypertension Neg Hx    Hyperlipidemia Neg Hx    Heart attack Neg Hx    Diabetes Neg Hx    Colon cancer Neg Hx    Rectal cancer Neg Hx    Esophageal cancer Neg Hx     Pancreatic cancer Neg Hx     Social History   Tobacco Use   Smoking status: Never   Smokeless tobacco: Never  Substance Use Topics   Alcohol use: No    Subjective:   Presents with concerns for sinus infection; symptoms x 1 week; does feel like congestion moving into chest; "dry, tickly cough."  Using OTC Dayquil/ Nyquil with some benefit; has taken allergy shots in the past;    Objective:  Vitals:   08/19/21 1308  BP: 110/70  Pulse: 73  Temp: 98 F (36.7 C)  TempSrc: Oral  SpO2: 98%  Weight: 180 lb 9.6 oz (81.9 kg)  Height: _0  (1.727 m)    General: Well developed, well nourished, in no acute distress  Skin : Warm and dry.  Head: Normocephalic and atraumatic  Eyes: Sclera and conjunctiva clear; pupils round and reactive to light; extraocular movements intact  Ears: External normal; canals clear; tympanic membranes normal  Oropharynx: Pink, supple. No suspicious lesions  Neck: Supple without thyromegaly, adenopathy  Lungs: Respirations unlabored; clear to auscultation bilaterally without wheeze, rales, rhonchi  CVS exam: normal rate and regular rhythm.  Neurologic: Alert and oriented; speech intact; face symmetrical;  moves all extremities well; CNII-XII intact without focal deficit   Assessment:  1. Acute sinusitis, recurrence not specified, unspecified location     Plan:  Rx for Doxycycline 100 mg bid x 10 days; refill updated on [place]; increase fluids, rest and follow up worse, no better.   This visit occurred during the SARS-CoV-2 public health emergency.  Safety protocols were in place, including screening questions prior to the visit, additional usage of staff PPE, and extensive cleaning of exam room while observing appropriate contact time as indicated for disinfecting solutions.     No follow-ups on file.  No orders of the defined types were placed in this encounter.   Requested Prescriptions   Signed Prescriptions Disp Refills   doxycycline  (VIBRAMYCIN) 100 MG capsule 20 capsule 0    Sig: Take 1 capsule (100 mg total) by mouth 2 (two) times daily.   azelastine (ASTELIN) 0.1 % nasal spray 30 mL 12    Sig: Place 2 sprays into both nostrils 2 (two) times daily. Use in each nostril as directed

## 2021-08-30 ENCOUNTER — Encounter: Payer: Self-pay | Admitting: Gastroenterology

## 2021-08-30 ENCOUNTER — Ambulatory Visit (AMBULATORY_SURGERY_CENTER): Payer: Medicare PPO | Admitting: Gastroenterology

## 2021-08-30 ENCOUNTER — Other Ambulatory Visit: Payer: Self-pay

## 2021-08-30 VITALS — BP 139/82 | HR 83 | Temp 98.4°F | Resp 16 | Ht 68.0 in | Wt 179.0 lb

## 2021-08-30 DIAGNOSIS — K5730 Diverticulosis of large intestine without perforation or abscess without bleeding: Secondary | ICD-10-CM | POA: Diagnosis not present

## 2021-08-30 DIAGNOSIS — Z1211 Encounter for screening for malignant neoplasm of colon: Secondary | ICD-10-CM

## 2021-08-30 DIAGNOSIS — K640 First degree hemorrhoids: Secondary | ICD-10-CM

## 2021-08-30 DIAGNOSIS — K2970 Gastritis, unspecified, without bleeding: Secondary | ICD-10-CM

## 2021-08-30 DIAGNOSIS — R1310 Dysphagia, unspecified: Secondary | ICD-10-CM | POA: Diagnosis not present

## 2021-08-30 DIAGNOSIS — K219 Gastro-esophageal reflux disease without esophagitis: Secondary | ICD-10-CM

## 2021-08-30 DIAGNOSIS — K222 Esophageal obstruction: Secondary | ICD-10-CM | POA: Diagnosis not present

## 2021-08-30 DIAGNOSIS — K319 Disease of stomach and duodenum, unspecified: Secondary | ICD-10-CM | POA: Diagnosis not present

## 2021-08-30 DIAGNOSIS — R12 Heartburn: Secondary | ICD-10-CM

## 2021-08-30 DIAGNOSIS — K449 Diaphragmatic hernia without obstruction or gangrene: Secondary | ICD-10-CM | POA: Diagnosis not present

## 2021-08-30 DIAGNOSIS — K3189 Other diseases of stomach and duodenum: Secondary | ICD-10-CM | POA: Diagnosis not present

## 2021-08-30 DIAGNOSIS — R142 Eructation: Secondary | ICD-10-CM

## 2021-08-30 DIAGNOSIS — R140 Abdominal distension (gaseous): Secondary | ICD-10-CM | POA: Diagnosis not present

## 2021-08-30 HISTORY — PX: COLONOSCOPY: SHX174

## 2021-08-30 HISTORY — PX: UPPER GASTROINTESTINAL ENDOSCOPY: SHX188

## 2021-08-30 MED ORDER — OMEPRAZOLE 40 MG PO CPDR: 40.0000 mg | DELAYED_RELEASE_CAPSULE | Freq: Two times a day (BID) | ORAL | 1 refills | Status: DC

## 2021-08-30 MED ORDER — SODIUM CHLORIDE 0.9 % IV SOLN: 500.0000 mL | Freq: Once | INTRAVENOUS | Status: DC

## 2021-08-30 NOTE — Progress Notes (Signed)
VSS. Transported to PACU

## 2021-08-30 NOTE — Op Note (Signed)
[facility] Patient Name: Randall Scott Procedure Date: 08/30/2021 2:28 PM MRN: [id number] Endoscopist: Gerrit Heck , MD Age: 71 Referring MD:  Date of Birth: 09/07/1950 Gender: Male Account #: [id number] Procedure:                Upper GI endoscopy Indications:              Dysphagia, Heartburn, Suspected esophageal reflux,                            Abdominal bloating Medicines:                Monitored Anesthesia Care Procedure:                Pre-Anesthesia Assessment:                           - Prior to the procedure, a History and Physical                            was performed, and patient medications and                            allergies were reviewed. The patient's tolerance of                            previous anesthesia was also reviewed. The risks                            and benefits of the procedure and the sedation                            options and risks were discussed with the patient.                            All questions were answered, and informed consent                            was obtained. Prior Anticoagulants: The patient has                            taken Xarelto (rivaroxaban), last dose was 2 days                            prior to procedure. ASA Grade Assessment: III - A                            patient with severe systemic disease. After                            reviewing the risks and benefits, the patient was                            deemed in satisfactory condition to undergo the  procedure.                           After obtaining informed consent, the endoscope was                            passed under direct vision. Throughout the                            procedure, the patient's blood pressure, pulse, and                            oxygen saturations were monitored continuously. The                            Endoscope was introduced through the mouth, and                             advanced to the second part of duodenum. The upper                            GI endoscopy was accomplished without difficulty.                            The patient tolerated the procedure well. Scope In: Scope Out: Findings:                 A non-obstructing and mild Schatzki ring was found                            in the lower third of the esophagus. A TTS dilator                            was passed through the scope. Dilation with an                            18-19-20 mm balloon dilator was performed to 20 mm.                            The dilation site was examined and showed mild                            mucosal disruption. This was then biopsied with a                            cold forceps for further fracturing of the ring (no                            pathology collected). Estimated blood loss was                            minimal.  A 2 cm hiatal hernia was present.                           The upper third of the esophagus and middle third                            of the esophagus were normal.                           Two patches of moderate inflammation characterized                            by congestion (edema) and erythema was found in the                            gastric body. There was a single shallow, clean                            based ulcer. Biopsies were taken with a cold                            forceps for histology. Estimated blood loss was                            minimal.                           Localized mild inflammation characterized by                            congestion (edema) and erythema was found in the                            gastric antrum. Biopsies were taken with a cold                            forceps for Helicobacter pylori testing. Estimated                            blood loss was minimal.                           Patchy mildly erythematous mucosa without active                             bleeding and with no stigmata of bleeding was found                            in the duodenal bulb and in the second portion of                            the duodenum. Biopsies were taken with a cold  forceps for histology. Estimated blood loss was                            minimal. Complications:            No immediate complications. Estimated Blood Loss:     Estimated blood loss was minimal. Impression:               - Non-obstructing and mild Schatzki ring. Dilated                            with 20 mm TTS balloon then fractured with cold                            forceps.                           - 2 cm hiatal hernia.                           - Normal upper third of esophagus and middle third                            of esophagus.                           - Gastritis. Biopsied.                           - Gastritis. Biopsied.                           - Erythematous duodenopathy. Biopsied. Recommendation:           - Patient has a contact number available for                            emergencies. The signs and symptoms of potential                            delayed complications were discussed with the                            patient. Return to normal activities tomorrow.                            Written discharge instructions were provided to the                            patient.                           - Soft diet today then advance tomorrow as                            tolerated per protocol.                           -  Continue present medications.                           - Await pathology results.                           - Resume Xarelto (rivaroxaban) at prior dose                            tomorrow.                           - Increase Prilosec (omeprazole) to 40 mg PO BID                            for 6 weeks to promote mucosal healing, then reduce                            back to lowest dose to control reflux  symptoms. Gerrit Heck, MD 08/30/2021 3:09:05 PM

## 2021-08-30 NOTE — Patient Instructions (Addendum)
Handouts provided on diverticulosis, hemorrhoids, gastritis and hiatal hernia.   Soft diet today, then advance tomorrow as tolerated.   Increase Prilosec (omeprazole) to 40mg  by mouth twice daily for 6 weeks to promote mucosal healing, then reduce back to lowest dose to control reflux symptoms.   Resume Xarelto (riveroxaban) at prior dose tomorrow.   YOU HAD AN ENDOSCOPIC PROCEDURE TODAY AT [facility]:   Refer to the procedure report that was given to you for any specific questions about what was found during the examination.  If the procedure report does not answer your questions, please call your gastroenterologist to clarify.  If you requested that your care partner not be given the details of your procedure findings, then the procedure report has been included in a sealed envelope for you to review at your convenience later.  YOU SHOULD EXPECT: Some feelings of bloating in the abdomen. Passage of more gas than usual.  Walking can help get rid of the air that was put into your GI tract during the procedure and reduce the bloating. If you had a lower endoscopy (such as a colonoscopy or flexible sigmoidoscopy) you may notice spotting of blood in your stool or on the toilet paper. If you underwent a bowel prep for your procedure, you may not have a normal bowel movement for a few days.  Please Note:  You might notice some irritation and congestion in your nose or some drainage.  This is from the oxygen used during your procedure.  There is no need for concern and it should clear up in a day or so.  SYMPTOMS TO REPORT IMMEDIATELY:  Following lower endoscopy (colonoscopy or flexible sigmoidoscopy):  Excessive amounts of blood in the stool  Significant tenderness or worsening of abdominal pains  Swelling of the abdomen that is new, acute  Fever of 100F or higher  Following upper endoscopy (EGD)  Vomiting of blood or coffee ground material  New chest pain or pain under the  shoulder blades  Painful or persistently difficult swallowing  New shortness of breath  Fever of 100F or higher  Black, tarry-looking stools  For urgent or emergent issues, a gastroenterologist can be reached at any hour by calling [phone]. Do not use MyChart messaging for urgent concerns.    DIET:  Soft diet for the rest of today (see handout). Then advance as tolerated to your regular diet tomorrow. Drink plenty of fluids but you should avoid alcoholic beverages for 24 hours.  ACTIVITY:  You should plan to take it easy for the rest of today and you should NOT DRIVE or use heavy machinery until tomorrow (because of the sedation medicines used during the test).    FOLLOW UP: Our staff will call the number listed on your records 48-72 hours following your procedure to check on you and address any questions or concerns that you may have regarding the information given to you following your procedure. If we do not reach you, we will leave a message.  We will attempt to reach you two times.  During this call, we will ask if you have developed any symptoms of COVID 19. If you develop any symptoms (ie: fever, flu-like symptoms, shortness of breath, cough etc.) before then, please call [phone].  If you test positive for Covid 19 in the 2 weeks post procedure, please call and report this information to [place].    If any biopsies were taken you will be contacted by phone or by letter within  the next 1-3 weeks.  Please call us at [phone] if you have not heard about the biopsies in 3 weeks.    SIGNATURES/CONFIDENTIALITY: You and/or your care partner have signed paperwork which will be entered into your electronic medical record.  These signatures attest to the fact that that the information above on your After Visit Summary has been reviewed and is understood.  Full responsibility of the confidentiality of this discharge information lies with you and/or your care-partner.

## 2021-08-30 NOTE — Progress Notes (Signed)
GASTROENTEROLOGY PROCEDURE H&P NOTE   Primary Care Physician: Darreld Mclean, MD    Reason for Procedure:   Abdominal bloating, belching, early satiety, dysphagia, GERD, colon cancer screening  Plan:    EGD with possible bx and dilation ; colonoscopy  Patient is appropriate for endoscopic procedure(s) in the ambulatory ([facility]) setting.  The nature of the procedure, as well as the risks, benefits, and alternatives were carefully and thoroughly reviewed with the patient. Ample time for discussion and questions allowed. The patient understood, was satisfied, and agreed to proceed.     HPI: Randall Scott is a 71 y.o. male who presents for EGD and colonoscopy for evaluation of abdominal bloating, belching, GERD, early satiety, dysphagia and CRC screening.  Patient was most recently seen in the Gastroenterology Clinic on 08/03/2021 by me.  No interval change in medical history since that appointment. Please refer to that note for full details regarding GI history and clinical presentation.   Past Medical History:  Diagnosis Date   Allergy    Arthritis    Asthma    Cataract    GERD (gastroesophageal reflux disease)     Past Surgical History:  Procedure Laterality Date   COLONOSCOPY  08/30/2021   EYE SURGERY     HERNIA REPAIR     PERIPHERAL VASCULAR THROMBECTOMY Right    RLE   UPPER GASTROINTESTINAL ENDOSCOPY  08/30/2021    Prior to Admission medications   Medication Sig Start Date End Date Taking? Authorizing Provider  doxycycline (VIBRAMYCIN) 100 MG capsule Take 1 capsule (100 mg total) by mouth 2 (two) times daily. 08/19/21  Yes Marrian Salvage, FNP  Probiotic Product (PROBIOTIC DAILY PO) Take by mouth.   Yes [provider]  azelastine (ASTELIN) 0.1 % nasal spray Place 2 sprays into both nostrils 2 (two) times daily. Use in each nostril as directed 08/19/21   Marrian Salvage, FNP  folic acid (FOLVITE) 202 MCG tablet Take 1,600 mcg by mouth daily.     [provider]  golimumab ([place] ARIA) 50 MG/4ML SOLN injection every 8 (eight) weeks.    [provider]  methotrexate 2.5 MG tablet Take by mouth once a week.    [provider]  omeprazole (PRILOSEC) 40 MG capsule Take 40 mg by mouth daily. 08/08/18   [provider]  rivaroxaban (XARELTO) 20 MG TABS tablet Take 1 tablet (20 mg total) by mouth daily with supper. 07/13/21   Copland, Gay Filler, MD    Current Outpatient Medications  Medication Sig Dispense Refill   doxycycline (VIBRAMYCIN) 100 MG capsule Take 1 capsule (100 mg total) by mouth 2 (two) times daily. 20 capsule 0   Probiotic Product (PROBIOTIC DAILY PO) Take by mouth.     azelastine (ASTELIN) 0.1 % nasal spray Place 2 sprays into both nostrils 2 (two) times daily. Use in each nostril as directed 30 mL 12   folic acid (FOLVITE) 542 MCG tablet Take 1,600 mcg by mouth daily.     golimumab (SIMPONI ARIA) 50 MG/4ML SOLN injection every 8 (eight) weeks.     methotrexate 2.5 MG tablet Take by mouth once a week.     omeprazole (PRILOSEC) 40 MG capsule Take 40 mg by mouth daily.     rivaroxaban (XARELTO) 20 MG TABS tablet Take 1 tablet (20 mg total) by mouth daily with supper. 90 tablet 3   Current Facility-Administered Medications  Medication Dose Route Frequency Provider Last Rate Last Admin   0.9 %  sodium chloride infusion  500 mL Intravenous Once Ramses Klecka V, DO        Allergies as of 08/30/2021   (No Known Allergies)    Family History  Problem Relation Age of Onset   Lung cancer Mother    Lung cancer Father    Sudden death Neg Hx    Hypertension Neg Hx    Hyperlipidemia Neg Hx    Heart attack Neg Hx    Diabetes Neg Hx    Colon cancer Neg Hx    Rectal cancer Neg Hx    Esophageal cancer Neg Hx    Pancreatic cancer Neg Hx    Stomach cancer Neg Hx     Social History   Socioeconomic History   Marital status: Married    Spouse name: Caren Griffins   Number of children: 2   Years  of education: 12   Highest education level: Not on file  Occupational History   Occupation: Information systems manager: [employer]   Occupation: retired  Tobacco Use   Smoking status: Never   Smokeless tobacco: Never  Scientific laboratory technician Use: Never used  Substance and Sexual Activity   Alcohol use: No   Drug use: No   Sexual activity: Yes    Birth control/protection: None    Comment: married  Other Topics Concern   Not on file  Social History Narrative   Lives with his wife.  Daughters are grown and live nearby.   Patient is right handed   Education level is 12   Caffeine consumption is 2-3 cups daily   Social Determinants of Health   Financial Resource Strain: Low Risk    Difficulty of Paying Living Expenses: Not hard at all  Food Insecurity: No Food Insecurity   Worried About Charity fundraiser in the Last Year: Never true   Ran Out of Food in the Last Year: Never true  Transportation Needs: No Transportation Needs   Lack of Transportation (Medical): No   Lack of Transportation (Non-Medical): No  Physical Activity: Sufficiently Active   Days of Exercise per Week: 4 days   Minutes of Exercise per Session: 60 min  Stress: No Stress Concern Present   Feeling of Stress : Not at all  Social Connections: Moderately Integrated   Frequency of Communication with Friends and Family: Three times a week   Frequency of Social Gatherings with Friends and Family: More than three times a week   Attends Religious Services: More than 4 times per year   Active Member of [employer] or Organizations: No   Attends Archivist Meetings: Never   Marital Status: Married  Human resources officer Violence: Not At Risk   Fear of Current or Ex-Partner: No   Emotionally Abused: No   Physically Abused: No   Sexually Abused: No    Physical Exam: Vital signs in last 24 hours: @BP  121/85    Pulse 73    Temp 98.4 F (36.9 C)    Ht 5\' 8"  (1.727 m)    Wt 179 lb (81.2 kg)    SpO2 98%     BMI 27.22 kg/m  GEN: NAD EYE: Sclerae anicteric ENT: MMM CV: Non-tachycardic Pulm: CTA b/l GI: Soft, NT/ND NEURO:  Alert & Oriented x [place], DO [facility]   08/30/2021 2:22 PM

## 2021-08-30 NOTE — Op Note (Signed)
[facility] Patient Name: Randall Scott Procedure Date: 08/30/2021 2:19 PM MRN: [id number] Endoscopist: Gerrit Heck , MD Age: 71 Referring MD:  Date of Birth: 02-12-1951 Gender: Male Account #: [id number] Procedure:                Colonoscopy Indications:              Screening for colorectal malignant neoplasm (last                            colonoscopy was 10 years ago) Medicines:                Monitored Anesthesia Care Procedure:                Pre-Anesthesia Assessment:                           - Prior to the procedure, a History and Physical                            was performed, and patient medications and                            allergies were reviewed. The patient's tolerance of                            previous anesthesia was also reviewed. The risks                            and benefits of the procedure and the sedation                            options and risks were discussed with the patient.                            All questions were answered, and informed consent                            was obtained. Prior Anticoagulants: The patient has                            taken Xarelto (rivaroxaban), last dose was 2 days                            prior to procedure. ASA Grade Assessment: III - A                            patient with severe systemic disease. After                            reviewing the risks and benefits, the patient was                            deemed in satisfactory condition to undergo the  procedure.                           After obtaining informed consent, the colonoscope                            was passed under direct vision. Throughout the                            procedure, the patient's blood pressure, pulse, and                            oxygen saturations were monitored continuously. The                            Olympus #[id number] was introduced through the anus                             and advanced to the the terminal ileum. The                            colonoscopy was performed without difficulty. The                            patient tolerated the procedure well. The quality                            of the bowel preparation was good. The terminal                            ileum, ileocecal valve, appendiceal orifice, and                            rectum were photographed. Scope In: 2:44:04 PM Scope Out: 2:59:30 PM Scope Withdrawal Time: 0 hours 11 minutes 48 seconds  Total Procedure Duration: 0 hours 15 minutes 26 seconds  Findings:                 The perianal and digital rectal examinations were                            normal.                           Multiple small and large-mouthed diverticula were                            found in the sigmoid colon, descending colon,                            transverse colon and ascending colon.                           Non-bleeding internal hemorrhoids were found during  retroflexion. The hemorrhoids were small.                           The exam was otherwise normal throughout the                            remainder of the colon.                           The terminal ileum appeared normal. Complications:            No immediate complications. Estimated Blood Loss:     Estimated blood loss: none. Impression:               - Diverticulosis in the sigmoid colon, in the                            descending colon, in the transverse colon and in                            the ascending colon.                           - Non-bleeding internal hemorrhoids.                           - The examined portion of the ileum was normal.                           - No specimens collected. Recommendation:           - Patient has a contact number available for                            emergencies. The signs and symptoms of potential                            delayed complications were  discussed with the                            patient. Return to normal activities tomorrow.                            Written discharge instructions were provided to the                            patient.                           - Repeat colonoscopy is not recommended due to                            current age (48 years or older).                           - Return to GI office PRN. Gerrit Heck, MD 08/30/2021 3:11:38 PM

## 2021-08-30 NOTE — Progress Notes (Signed)
Called to room to assist during endoscopic procedure.  Patient ID and intended procedure confirmed with present staff. Received instructions for my participation in the procedure from the performing physician.  

## 2021-08-31 ENCOUNTER — Inpatient Hospital Stay: Payer: Medicare PPO | Admitting: Hematology & Oncology

## 2021-08-31 ENCOUNTER — Ambulatory Visit (HOSPITAL_BASED_OUTPATIENT_CLINIC_OR_DEPARTMENT_OTHER)
Admission: RE | Admit: 2021-08-31 | Discharge: 2021-08-31 | Disposition: A | Discharge status: Home / Self Care | Payer: Medicare PPO | Source: Ambulatory Visit | Attending: Hematology & Oncology | Admitting: Hematology & Oncology

## 2021-08-31 ENCOUNTER — Other Ambulatory Visit: Payer: Self-pay

## 2021-08-31 ENCOUNTER — Inpatient Hospital Stay: Payer: Medicare PPO | Attending: Hematology & Oncology

## 2021-08-31 ENCOUNTER — Encounter: Payer: Self-pay | Admitting: Hematology & Oncology

## 2021-08-31 VITALS — BP 128/74 | HR 93 | Temp 98.1°F | Resp 18 | Wt 176.8 lb

## 2021-08-31 DIAGNOSIS — Z86718 Personal history of other venous thrombosis and embolism: Secondary | ICD-10-CM | POA: Insufficient documentation

## 2021-08-31 DIAGNOSIS — D6851 Activated protein C resistance: Secondary | ICD-10-CM | POA: Insufficient documentation

## 2021-08-31 DIAGNOSIS — Z7982 Long term (current) use of aspirin: Secondary | ICD-10-CM | POA: Diagnosis not present

## 2021-08-31 DIAGNOSIS — M069 Rheumatoid arthritis, unspecified: Secondary | ICD-10-CM | POA: Insufficient documentation

## 2021-08-31 DIAGNOSIS — I824Y1 Acute embolism and thrombosis of unspecified deep veins of right proximal lower extremity: Secondary | ICD-10-CM | POA: Diagnosis not present

## 2021-08-31 LAB — CBC WITH DIFFERENTIAL (CANCER CENTER ONLY)
Abs Immature Granulocytes: 0.01 10*3/uL (ref 0.00–0.07)
Basophils Absolute: 0 10*3/uL (ref 0.0–0.1)
Basophils Relative: 1 %
Eosinophils Absolute: 0.2 10*3/uL (ref 0.0–0.5)
Eosinophils Relative: 3 %
HCT: 40.9 % (ref 39.0–52.0)
Hemoglobin: 13.9 g/dL (ref 13.0–17.0)
Immature Granulocytes: 0 %
Lymphocytes Relative: 28 %
Lymphs Abs: 1.6 10*3/uL (ref 0.7–4.0)
MCH: 33.9 pg (ref 26.0–34.0)
MCHC: 34 g/dL (ref 30.0–36.0)
MCV: 99.8 fL (ref 80.0–100.0)
Monocytes Absolute: 0.5 10*3/uL (ref 0.1–1.0)
Monocytes Relative: 8 %
Neutro Abs: 3.4 10*3/uL (ref 1.7–7.7)
Neutrophils Relative %: 60 %
Platelet Count: 209 10*3/uL (ref 150–400)
RBC: 4.1 MIL/uL — ABNORMAL LOW (ref 4.22–5.81)
RDW: 13.6 % (ref 11.5–15.5)
WBC Count: 5.7 10*3/uL (ref 4.0–10.5)
nRBC: 0 % (ref 0.0–0.2)

## 2021-08-31 LAB — CMP (CANCER CENTER ONLY)
ALT: 13 U/L (ref 0–44)
AST: 19 U/L (ref 15–41)
Albumin: 4.1 g/dL (ref 3.5–5.0)
Alkaline Phosphatase: 40 U/L (ref 38–126)
Anion gap: 7 (ref 5–15)
BUN: 9 mg/dL (ref 8–23)
CO2: 30 mmol/L (ref 22–32)
Calcium: 9.4 mg/dL (ref 8.9–10.3)
Chloride: 109 mmol/L (ref 98–111)
Creatinine: 1.16 mg/dL (ref 0.61–1.24)
GFR, Estimated: >60 mL/min (ref >60)
Glucose, Bld: 101 mg/dL — ABNORMAL HIGH (ref 70–99)
Potassium: 4.3 mmol/L (ref 3.5–5.1)
Sodium: 146 mmol/L — ABNORMAL HIGH (ref 135–145)
Total Bilirubin: 0.8 mg/dL (ref 0.3–1.2)
Total Protein: 6.4 g/dL — ABNORMAL LOW (ref 6.5–8.1)

## 2021-08-31 LAB — D-DIMER, QUANTITATIVE: D-Dimer, Quant: 0.51 ug/mL-FEU — ABNORMAL HIGH (ref 0.00–0.50)

## 2021-08-31 NOTE — Progress Notes (Signed)
?Hematology and Oncology Follow Up Visit ? ?Randall Scott ?[id number] ?07-21-1950 71 y.o. ?08/31/2021 ? ? 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 ?Current Therapy:   ?Xarelto 20 mg by mouth daily-to complete 1 year in August 2019  ?Xarelto 10 mg p.o. daily-maintenance therapy for 1 year-complete in August 2020 ?EC ASA 162 mg po q day -- start on 02/17/2019 --D/C on 06/16/2021 ?Xarelto 20 mg p.o. daily-6 weeks to finish up on 08/22/2021 ? ?    ?Interim History:  [name] is back for follow-up.  He now is off the Xarelto.  He is on aspirin at 162 mg a day.  We did do a Doppler of his right leg today.  There is no deep vein thrombus.  He still have the superficial thrombus in the greater saphenous vein and superficial saphenous vein. ? ?He has had no problems with leg swelling or pain.  He has had no cough or shortness of breath.  He has had no nausea or vomiting.  He has had no change in bowel or bladder habits.  There is been no bleeding. ? ?Overall, I would say his performance status is ECOG 1.   ? ?Medications:  ?Current Outpatient Medications:  ?  aspirin 81 MG chewable tablet, Chew 81 mg by mouth daily., Disp: , Rfl:  ?  folic acid (FOLVITE) 093 MCG tablet, Take 1,600 mcg by mouth daily., Disp: , Rfl:  ?  golimumab (SIMPONI ARIA) 50 MG/4ML SOLN injection, every 8 (eight) weeks., Disp: , Rfl:  ?  methotrexate 2.5 MG tablet, Take by mouth once a week., Disp: , Rfl:  ?  omeprazole (PRILOSEC) 40 MG capsule, Take 1 capsule (40 mg total) by mouth 2 (two) times daily., Disp: 30 capsule, Rfl: 1 ?  Probiotic Product (PROBIOTIC DAILY PO), Take by mouth., Disp: , Rfl:  ?  azelastine (ASTELIN) 0.1 % nasal spray, Place 2 sprays into both nostrils 2 (two) times daily. Use in each nostril as directed (Patient not taking: Reported on 08/31/2021), Disp: 30 mL, Rfl: 12 ?  doxycycline  (VIBRAMYCIN) 100 MG capsule, Take 1 capsule (100 mg total) by mouth 2 (two) times daily. (Patient not taking: Reported on 08/31/2021), Disp: 20 capsule, Rfl: 0 ?  omeprazole (PRILOSEC) 40 MG capsule, Take 40 mg by mouth daily. (Patient not taking: Reported on 08/31/2021), Disp: , Rfl:  ?  rivaroxaban (XARELTO) 20 MG TABS tablet, Take 1 tablet (20 mg total) by mouth daily with supper. (Patient not taking: Reported on 08/31/2021), Disp: 90 tablet, Rfl: 3 ? ?Allergies: No Known Allergies ? ?Past Medical History, Surgical history, Social history, and Family History were reviewed and updated. ? ?Review of Systems: ?Review of Systems  ?Constitutional:  Negative for appetite change, fatigue, fever and unexpected weight change.  ?HENT:   Negative for lump/mass, mouth sores, sore throat and trouble swallowing.   ?Respiratory:  Negative for cough, hemoptysis and shortness of breath.   ?Cardiovascular:  Negative for leg swelling and palpitations.  ?Gastrointestinal:  Negative for abdominal distention, abdominal pain, blood in stool, constipation, diarrhea, nausea and vomiting.  ?Genitourinary:  Negative for bladder incontinence, dysuria, frequency and hematuria.   ?Musculoskeletal:  Negative for arthralgias, back pain, gait problem and myalgias.  ?Skin:  Negative for itching and rash.  ?Neurological:  Negative for dizziness, extremity weakness, gait problem, headaches, numbness, seizures and speech difficulty.  ?Hematological:  Does not bruise/bleed easily.  ?Psychiatric/Behavioral:  Negative for depression and sleep disturbance. The patient is not nervous/anxious.   ? ?Physical Exam: ? weight is 176 lb 12.8 oz (80.2 kg). His oral temperature is 98.1 ?F (36.7 ?C). His blood pressure is 128/74 and his pulse is 93. His respiration is 18 and oxygen saturation is 99%.  ? ?Wt Readings from Last 3 Encounters:  ?08/31/21 176 lb 12.8 oz (80.2 kg)  ?08/30/21 179 lb (81.2 kg)  ?08/19/21 180 lb 9.6 oz (81.9 kg)  ? ? ?Physical Exam ?Vitals  reviewed.  ?HENT:  ?   Head: Normocephalic and atraumatic.  ?Eyes:  ?   Pupils: Pupils are equal, round, and reactive to light.  ?Cardiovascular:  ?   Rate and Rhythm: Normal rate and regular rhythm.  ?   Heart sounds: Normal heart sounds.  ?Pulmonary:  ?   Effort: Pulmonary effort is normal.  ?   Breath sounds: Normal breath sounds.  ?Abdominal:  ?   General: Bowel sounds are normal.  ?   Palpations: Abdomen is soft.  ?Musculoskeletal:     ?   General: No tenderness or deformity. Normal range of motion.  ?   Cervical back: Normal range of motion.  ?Lymphadenopathy:  ?   Cervical: No cervical adenopathy.  ?Skin: ?   General: Skin is warm and dry.  ?   Findings: No erythema or rash.  ?Neurological:  ?   Mental Status: He is alert and oriented to person, place, and time.  ?Psychiatric:     ?   Behavior: Behavior normal.     ?   Thought Content: Thought content normal.     ?   Judgment: Judgment normal.  ? ? ? ?Lab Results  ?Component Value Date  ? WBC 5.7 08/31/2021  ? HGB 13.9 08/31/2021  ? HCT 40.9 08/31/2021  ? MCV 99.8 08/31/2021  ? PLT 209 08/31/2021  ? ?  Chemistry   ?   ?Component Value Date/Time  ? NA 146 (H) 08/31/2021 0834  ? NA 139 03/10/2020 0000  ? NA 147 (H) 04/13/2017 0936  ? K 4.3 08/31/2021 0834  ? K 4.4 04/13/2017 0936  ? CL 109 08/31/2021 0834  ? CL 108 04/13/2017 0936  ? CO2 30 08/31/2021 0834  ? CO2 33 04/13/2017 0936  ? BUN 9 08/31/2021 0834  ? BUN 16 03/10/2020 0000  ? BUN 13 04/13/2017 0936  ? CREATININE 1.16 08/31/2021 0834  ? CREATININE 1.5 (H) 04/13/2017 0936  ? GLU 83 03/10/2020 0000  ?    ?Component Value Date/Time  ? CALCIUM 9.4 08/31/2021 0834  ? CALCIUM 10.0 04/13/2017 0936  ? ALKPHOS 40 08/31/2021 0834  ? ALKPHOS 51 04/13/2017 0936  ? AST 19 08/31/2021 0834  ? ALT 13 08/31/2021 0834  ? ALT 34 04/13/2017 0936  ? BILITOT 0.8 08/31/2021 0834  ?  ? ? ?Impression and Plan: ?[name] is a 71 year old male. He has a Factor V Leiden mutation. He is heterozygous for this. ? ?Again, we will  repeat a Doppler of the right leg when we see him back.  If there is still any propagation of thrombus, we may have to get him back on low-dose Xarelto. ? ?I told him if there are any problems between now and May, we can certainly see him back. ? 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 ? ?I know he has a rheumatoid arthritis.  I suppose this could always be considered a  risk factor for him.  However the factor V Leiden clearly is the main etiology for the thrombus. ? ? ? ? ?Volanda Napoleon, MD ?3/1/202311:14 AM ?

## 2021-09-01 ENCOUNTER — Telehealth: Payer: Self-pay | Admitting: *Deleted

## 2021-09-01 NOTE — Telephone Encounter (Signed)
?  Follow up Call- ? ?Call back number 08/30/2021  ?Post procedure Call Back phone  # [phone]  ?Permission to leave phone message Yes  ?Some recent data might be hidden  ?  ? ?Patient questions: ? ?Do you have a fever, pain , or abdominal swelling? No. ?Pain Score  0 * ? ?Have you tolerated food without any problems? Yes.   ? ?Have you been able to return to your normal activities? Yes.   ? ?Do you have any questions about your discharge instructions: ?Diet   No. ?Medications  No. ?Follow up visit  No. ? ?Do you have questions or concerns about your Care? No. ? ?Actions: ?* If pain score is 4 or above: ?No action needed, pain <4. ? ?Have you developed a fever since your procedure? no ? ?2.   Have you had an respiratory symptoms (SOB or cough) since your procedure? no ? ?3.   Have you tested positive for COVID 19 since your procedure no ? ?4.   Have you had any family members/close contacts diagnosed with the COVID 19 since your procedure?  no ? ? ?If yes to any of these questions please route to Joylene John, RN and Joella Prince, RN ? ? ? ?

## 2021-09-06 ENCOUNTER — Encounter: Payer: Self-pay | Admitting: Gastroenterology

## 2021-09-28 DIAGNOSIS — E663 Overweight: Secondary | ICD-10-CM | POA: Diagnosis not present

## 2021-09-28 DIAGNOSIS — Z1589 Genetic susceptibility to other disease: Secondary | ICD-10-CM | POA: Diagnosis not present

## 2021-09-28 DIAGNOSIS — M545 Low back pain, unspecified: Secondary | ICD-10-CM | POA: Diagnosis not present

## 2021-09-28 DIAGNOSIS — Z6827 Body mass index (BMI) 27.0-27.9, adult: Secondary | ICD-10-CM | POA: Diagnosis not present

## 2021-09-28 DIAGNOSIS — L4050 Arthropathic psoriasis, unspecified: Secondary | ICD-10-CM | POA: Diagnosis not present

## 2021-09-28 DIAGNOSIS — I80201 Phlebitis and thrombophlebitis of unspecified deep vessels of right lower extremity: Secondary | ICD-10-CM | POA: Diagnosis not present

## 2021-09-28 DIAGNOSIS — L409 Psoriasis, unspecified: Secondary | ICD-10-CM | POA: Diagnosis not present

## 2021-09-28 DIAGNOSIS — Z79899 Other long term (current) drug therapy: Secondary | ICD-10-CM | POA: Diagnosis not present

## 2021-09-29 ENCOUNTER — Telehealth: Payer: Self-pay | Admitting: *Deleted

## 2021-09-29 NOTE — Telephone Encounter (Signed)
Faxed medical record request from [facility] to [facility] @ [phone] ?

## 2021-10-10 DIAGNOSIS — Z79899 Other long term (current) drug therapy: Secondary | ICD-10-CM | POA: Diagnosis not present

## 2021-10-10 DIAGNOSIS — L4050 Arthropathic psoriasis, unspecified: Secondary | ICD-10-CM | POA: Diagnosis not present

## 2021-11-09 ENCOUNTER — Other Ambulatory Visit: Payer: Self-pay

## 2021-11-09 DIAGNOSIS — D6851 Activated protein C resistance: Secondary | ICD-10-CM

## 2021-11-10 ENCOUNTER — Inpatient Hospital Stay: Payer: Medicare PPO | Attending: Hematology & Oncology

## 2021-11-10 ENCOUNTER — Encounter: Payer: Self-pay | Admitting: Hematology & Oncology

## 2021-11-10 ENCOUNTER — Other Ambulatory Visit: Payer: Self-pay

## 2021-11-10 ENCOUNTER — Inpatient Hospital Stay: Payer: Medicare PPO | Admitting: Hematology & Oncology

## 2021-11-10 ENCOUNTER — Ambulatory Visit (HOSPITAL_BASED_OUTPATIENT_CLINIC_OR_DEPARTMENT_OTHER)
Admission: RE | Admit: 2021-11-10 | Discharge: 2021-11-10 | Disposition: A | Discharge status: Home / Self Care | Payer: Medicare PPO | Source: Ambulatory Visit | Attending: Hematology & Oncology | Admitting: Hematology & Oncology

## 2021-11-10 VITALS — BP 106/69 | HR 62 | Temp 97.6°F | Resp 18 | Wt 174.0 lb

## 2021-11-10 DIAGNOSIS — I824Y1 Acute embolism and thrombosis of unspecified deep veins of right proximal lower extremity: Secondary | ICD-10-CM | POA: Insufficient documentation

## 2021-11-10 DIAGNOSIS — Z86718 Personal history of other venous thrombosis and embolism: Secondary | ICD-10-CM | POA: Insufficient documentation

## 2021-11-10 DIAGNOSIS — Z7982 Long term (current) use of aspirin: Secondary | ICD-10-CM | POA: Insufficient documentation

## 2021-11-10 DIAGNOSIS — M069 Rheumatoid arthritis, unspecified: Secondary | ICD-10-CM | POA: Insufficient documentation

## 2021-11-10 DIAGNOSIS — D6851 Activated protein C resistance: Secondary | ICD-10-CM

## 2021-11-10 DIAGNOSIS — Z0389 Encounter for observation for other suspected diseases and conditions ruled out: Secondary | ICD-10-CM | POA: Diagnosis not present

## 2021-11-10 LAB — CBC WITH DIFFERENTIAL (CANCER CENTER ONLY)
Abs Immature Granulocytes: 0.01 10*3/uL (ref 0.00–0.07)
Basophils Absolute: 0.1 10*3/uL (ref 0.0–0.1)
Basophils Relative: 1 %
Eosinophils Absolute: 0.3 10*3/uL (ref 0.0–0.5)
Eosinophils Relative: 5 %
HCT: 40.5 % (ref 39.0–52.0)
Hemoglobin: 13.9 g/dL (ref 13.0–17.0)
Immature Granulocytes: 0 %
Lymphocytes Relative: 35 %
Lymphs Abs: 1.9 10*3/uL (ref 0.7–4.0)
MCH: 34 pg (ref 26.0–34.0)
MCHC: 34.3 g/dL (ref 30.0–36.0)
MCV: 99 fL (ref 80.0–100.0)
Monocytes Absolute: 0.4 10*3/uL (ref 0.1–1.0)
Monocytes Relative: 6 %
Neutro Abs: 2.9 10*3/uL (ref 1.7–7.7)
Neutrophils Relative %: 53 %
Platelet Count: 203 10*3/uL (ref 150–400)
RBC: 4.09 MIL/uL — ABNORMAL LOW (ref 4.22–5.81)
RDW: 13.5 % (ref 11.5–15.5)
WBC Count: 5.5 10*3/uL (ref 4.0–10.5)
nRBC: 0 % (ref 0.0–0.2)

## 2021-11-10 LAB — D-DIMER, QUANTITATIVE: D-Dimer, Quant: 0.3 ug/mL-FEU (ref 0.00–0.50)

## 2021-11-10 LAB — CMP (CANCER CENTER ONLY)
ALT: 15 U/L (ref 0–44)
AST: 19 U/L (ref 15–41)
Albumin: 4.3 g/dL (ref 3.5–5.0)
Alkaline Phosphatase: 45 U/L (ref 38–126)
Anion gap: 8 (ref 5–15)
BUN: 19 mg/dL (ref 8–23)
CO2: 27 mmol/L (ref 22–32)
Calcium: 9.2 mg/dL (ref 8.9–10.3)
Chloride: 106 mmol/L (ref 98–111)
Creatinine: 1.27 mg/dL — ABNORMAL HIGH (ref 0.61–1.24)
GFR, Estimated: >60 mL/min (ref >60)
Glucose, Bld: 99 mg/dL (ref 70–99)
Potassium: 4.3 mmol/L (ref 3.5–5.1)
Sodium: 141 mmol/L (ref 135–145)
Total Bilirubin: 0.7 mg/dL (ref 0.3–1.2)
Total Protein: 6.4 g/dL — ABNORMAL LOW (ref 6.5–8.1)

## 2021-11-10 NOTE — Progress Notes (Signed)
?Hematology and Oncology Follow Up Visit ? ?LEVII HAIRFIELD ?[id number] ?[birth year] 71 y.o. ?11/10/2021 ? ? 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 ?Current Therapy:   ?Xarelto 20 mg by mouth daily-to complete 1 year in August 2019  ?Xarelto 10 mg p.o. daily-maintenance therapy for 1 year-complete in August 2020 ?EC ASA 162 mg po q day -- start on 09/16/2021 ?Xarelto 20 mg p.o. daily-6 weeks to finish up on 08/22/2021 ? ?    ?Interim History:  Mr. Rhodes is back for follow-up.  We last saw him back in January.  Since then, he has been doing okay.  He did have a Doppler of his right leg done today.  This did not show any evidence of thrombus in his superficial veins in the right lower leg. ? ?He is on baby aspirin.  He is on 162 mg a day.  We will keep him on that. ? ?He is doing good to [employer] in June.  I told him that while he is traveling, he should get back on Xarelto.  He has a 20 mg pills.  I told him to start Xarelto the day before he goes on vacation.  He will take Xarelto daily until he comes back.  The day after his return, he will stop the Xarelto and get back on his baby aspirin.  He does understand this. ? ?He has had no problems with bleeding.  There is no dyspepsia.  He has had no change in bowel or bladder habits.  He has had no nausea or vomiting.  There is no chest wall pain.  He is no rashes.  He has had no cough or shortness of breath. ? ?Overall, I would say his performance status is probably ECOG 0. .   ? ?Medications:  ?Current Outpatient Medications:  ?  aspirin 81 MG EC tablet, Take 162 mg by mouth daily., Disp: , Rfl:  ?  azelastine (ASTELIN) 0.1 % nasal spray, Place 2 sprays into both nostrils 2 (two) times daily. Use in each nostril as directed (Patient not taking: Reported on 08/31/2021), Disp: 30 mL, Rfl: 12 ?  folic acid (FOLVITE) 546 MCG tablet,  Take 1,600 mcg by mouth daily., Disp: , Rfl:  ?  golimumab (SIMPONI ARIA) 50 MG/4ML SOLN injection, every 8 (eight) weeks., Disp: , Rfl:  ?  methotrexate 2.5 MG tablet, Take by mouth once a week., Disp: , Rfl:  ?  Probiotic Product (PROBIOTIC DAILY PO), Take by mouth., Disp: , Rfl:  ? ?Allergies: No Known Allergies ? ?Past Medical History, Surgical history, Social history, and Family History were reviewed and updated. ? ?Review of Systems: ?Review of Systems  ?Constitutional:  Negative for appetite change, fatigue, fever and unexpected weight change.  ?HENT:   Negative for lump/mass, mouth sores, sore throat and trouble swallowing.   ?Respiratory:  Negative for cough, hemoptysis and shortness of breath.   ?Cardiovascular:  Negative for leg swelling and palpitations.  ?Gastrointestinal:  Negative for abdominal distention, abdominal pain, blood in stool, constipation, diarrhea, nausea and vomiting.  ?Genitourinary:  Negative for bladder incontinence, dysuria, frequency and hematuria.   ?Musculoskeletal:  Negative for arthralgias, back pain, gait problem and myalgias.  ?Skin:  Negative for itching and rash.  ?Neurological:  Negative for dizziness, extremity weakness, gait problem, headaches, numbness, seizures and speech difficulty.  ?Hematological:  Does not bruise/bleed easily.  ?Psychiatric/Behavioral:  Negative for depression and  sleep disturbance. The patient is not nervous/anxious.   ? ?Physical Exam: ? weight is 174 lb (78.9 kg). His oral temperature is 97.6 ?F (36.4 ?C). His blood pressure is 106/69 and his pulse is 62. His respiration is 18 and oxygen saturation is 100%.  ? ?Wt Readings from Last 3 Encounters:  ?11/10/21 174 lb (78.9 kg)  ?08/31/21 176 lb 12.8 oz (80.2 kg)  ?08/30/21 179 lb (81.2 kg)  ? ? ?Physical Exam ?Vitals reviewed.  ?HENT:  ?   Head: Normocephalic and atraumatic.  ?Eyes:  ?   Pupils: Pupils are equal, round, and reactive to light.  ?Cardiovascular:  ?   Rate and Rhythm: Normal rate and  regular rhythm.  ?   Heart sounds: Normal heart sounds.  ?Pulmonary:  ?   Effort: Pulmonary effort is normal.  ?   Breath sounds: Normal breath sounds.  ?Abdominal:  ?   General: Bowel sounds are normal.  ?   Palpations: Abdomen is soft.  ?Musculoskeletal:     ?   General: No tenderness or deformity. Normal range of motion.  ?   Cervical back: Normal range of motion.  ?Lymphadenopathy:  ?   Cervical: No cervical adenopathy.  ?Skin: ?   General: Skin is warm and dry.  ?   Findings: No erythema or rash.  ?Neurological:  ?   Mental Status: He is alert and oriented to person, place, and time.  ?Psychiatric:     ?   Behavior: Behavior normal.     ?   Thought Content: Thought content normal.     ?   Judgment: Judgment normal.  ? ? ? ?Lab Results  ?Component Value Date  ? WBC 5.5 11/10/2021  ? HGB 13.9 11/10/2021  ? HCT 40.5 11/10/2021  ? MCV 99.0 11/10/2021  ? PLT 203 11/10/2021  ? ?  Chemistry   ?   ?Component Value Date/Time  ? NA 141 11/10/2021 0802  ? NA 139 03/10/2020 0000  ? NA 147 (H) 04/13/2017 0936  ? K 4.3 11/10/2021 0802  ? K 4.4 04/13/2017 0936  ? CL 106 11/10/2021 0802  ? CL 108 04/13/2017 0936  ? CO2 27 11/10/2021 0802  ? CO2 33 04/13/2017 0936  ? BUN 19 11/10/2021 0802  ? BUN 16 03/10/2020 0000  ? BUN 13 04/13/2017 0936  ? CREATININE 1.27 (H) 11/10/2021 0802  ? CREATININE 1.5 (H) 04/13/2017 0936  ? GLU 83 03/10/2020 0000  ?    ?Component Value Date/Time  ? CALCIUM 9.2 11/10/2021 0802  ? CALCIUM 10.0 04/13/2017 0936  ? ALKPHOS 45 11/10/2021 0802  ? ALKPHOS 51 04/13/2017 0936  ? AST 19 11/10/2021 0802  ? ALT 15 11/10/2021 0802  ? ALT 34 04/13/2017 0936  ? BILITOT 0.7 11/10/2021 0802  ?  ? ? ?Impression and Plan: ?Mr. Chuba is a 71 year old male. He has a Factor V Leiden mutation. He is heterozygous for this. ? ?I am happy that the Doppler did not show any residual thrombus in the superficial veins in his right lower leg.  As such, we will continue him on the aspirin. ? ?Again, when he goes on vacation, he will  switch over to Xarelto while he is on vacation. ? ?I do not see that we have to do any further Dopplers on him.  I would only do one if he does have any changes in his legs. ? ?His D-dimer was normal today. ? ?I will plan to get him back now probably in  6 months.  I think this would be reasonable. ? ? ? ?Volanda Napoleon, MD ?5/11/20239:58 AM ?

## 2021-12-05 DIAGNOSIS — Z111 Encounter for screening for respiratory tuberculosis: Secondary | ICD-10-CM | POA: Diagnosis not present

## 2021-12-05 DIAGNOSIS — Z79899 Other long term (current) drug therapy: Secondary | ICD-10-CM | POA: Diagnosis not present

## 2021-12-05 DIAGNOSIS — R5383 Other fatigue: Secondary | ICD-10-CM | POA: Diagnosis not present

## 2021-12-05 DIAGNOSIS — L4050 Arthropathic psoriasis, unspecified: Secondary | ICD-10-CM | POA: Diagnosis not present

## 2022-01-16 IMAGING — CT CT ABD-PELV W/ CM
2 of 5 series · 16 of 46 positions shown, 18 images · IV contrast (Omnipaque)
Comparison: CT abdomen and pelvis 03/14/2017; X-ray chest
05/12/2013.

CLINICAL DATA: Patient complains of abdominal distension, nausea
and gas (even without eating) for a few months. History of hernia
surgery.

EXAM:
CT ABDOMEN AND PELVIS WITH CONTRAST
TECHNIQUE: Multidetector CT imaging of the abdomen and pelvis was performed
using the standard protocol following bolus administration of
intravenous contrast.
CONTRAST:  100mL OMNIPAQUE IOHEXOL 300 MG/ML  SOLN

[Series 2: axial st · axial · 0.89mm/px · z∈[-526,-66]mm · 13 of 104 slices shown, 15 images]
[im 6/104  soft-tissue]
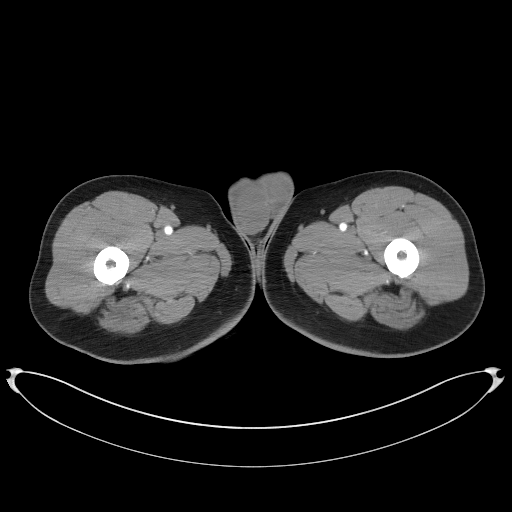
[im 6/104  bone]
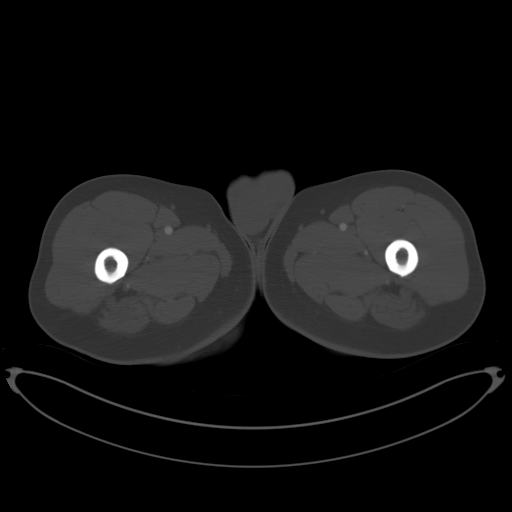
[im 16/104  soft-tissue]
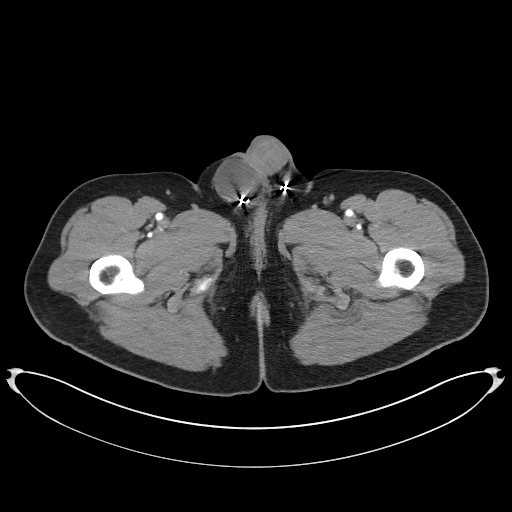
[im 21/104  soft-tissue]
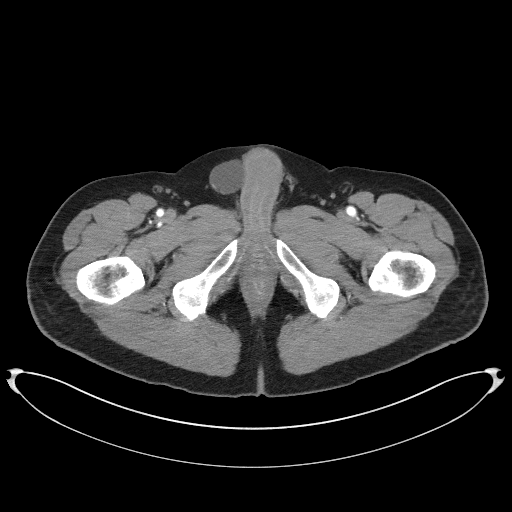
[im 31/104  soft-tissue]
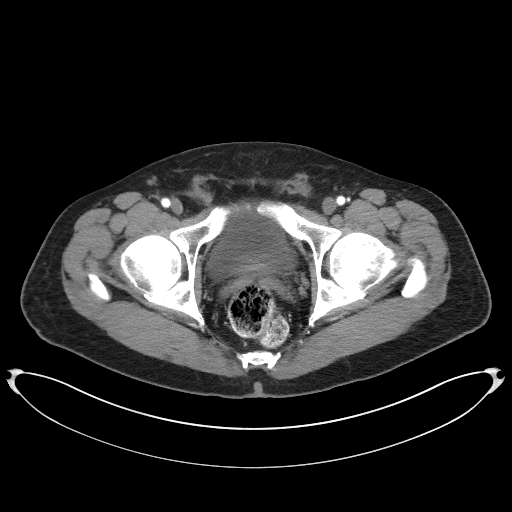
[im 37/104  soft-tissue]
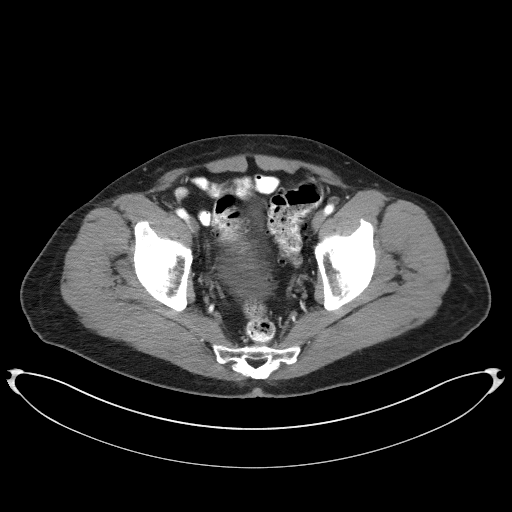
[im 47/104  soft-tissue]
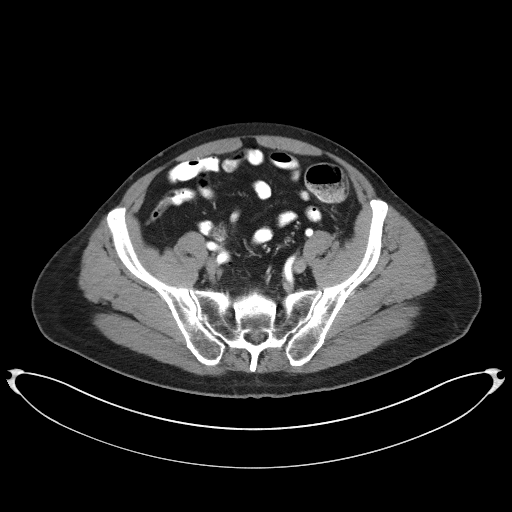
[im 52/104  soft-tissue]
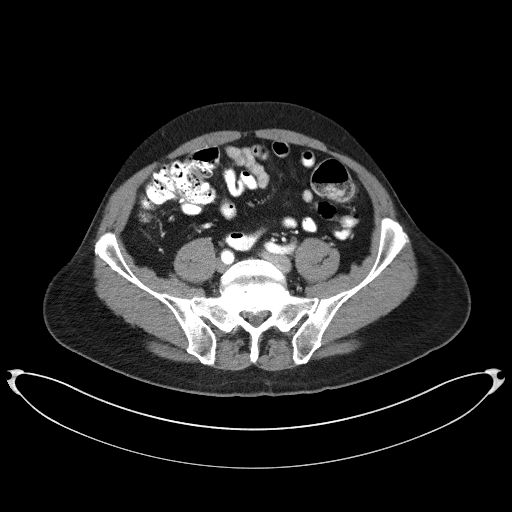
[im 57/104  soft-tissue]
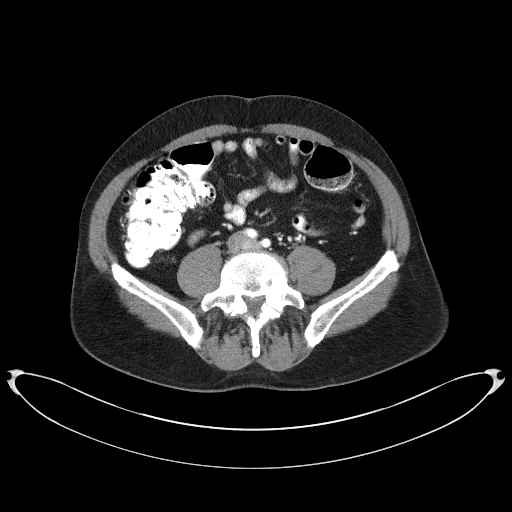
[im 67/104  soft-tissue]
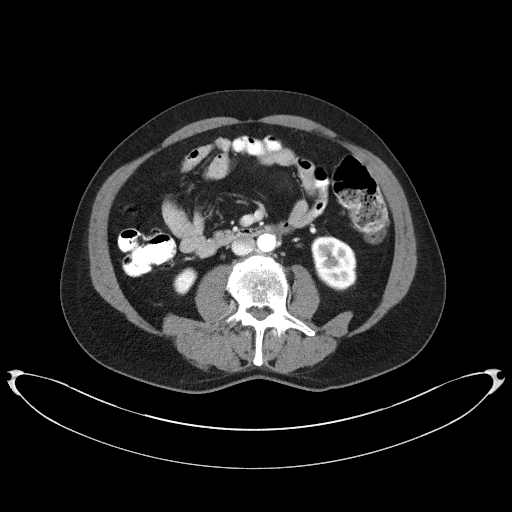
[im 67/104  bone]
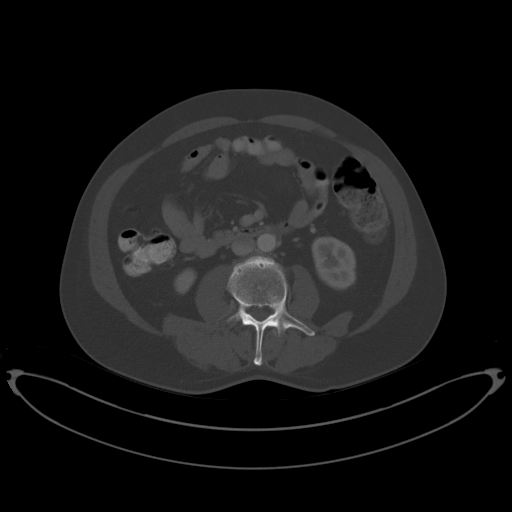
[im 73/104  soft-tissue]
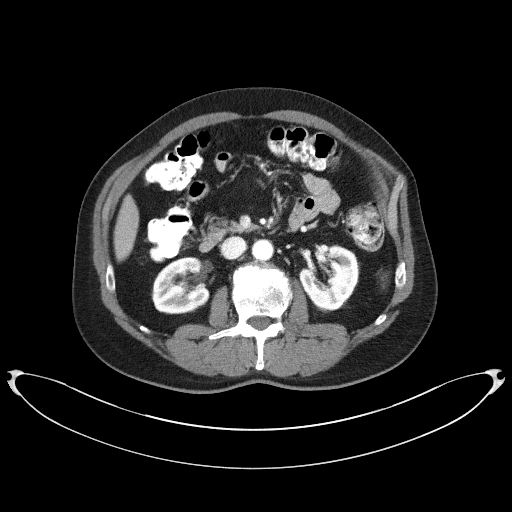
[im 83/104  soft-tissue]
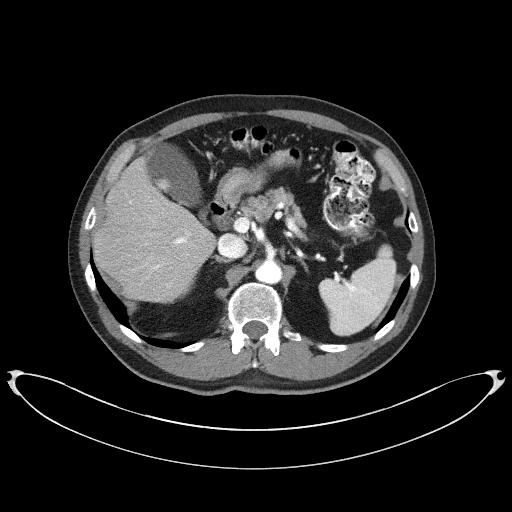
[im 88/104  soft-tissue]
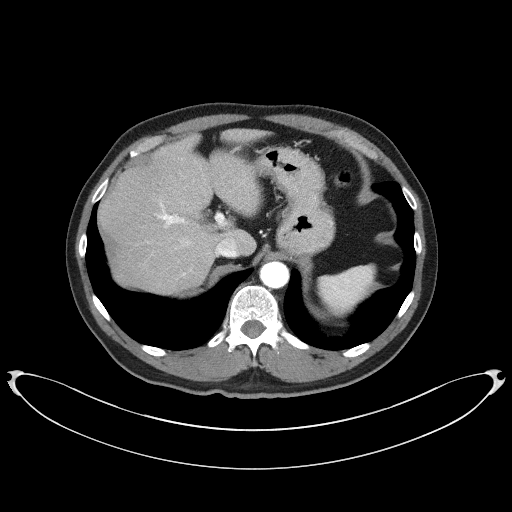
[im 98/104  soft-tissue]
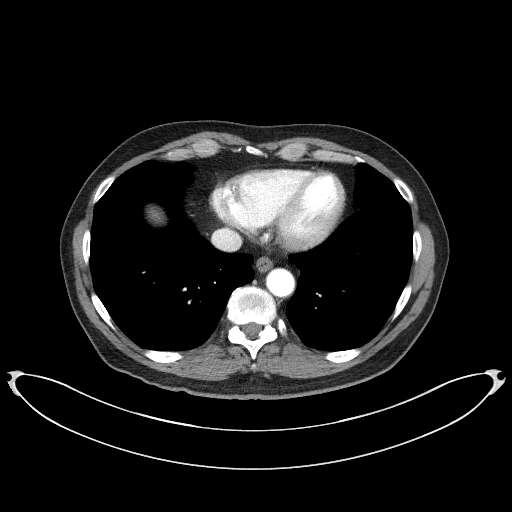

[Series 5: coronal st · coronal · 0.84mm/px · 3 of 95 slices shown]
[im 32/95  soft-tissue]
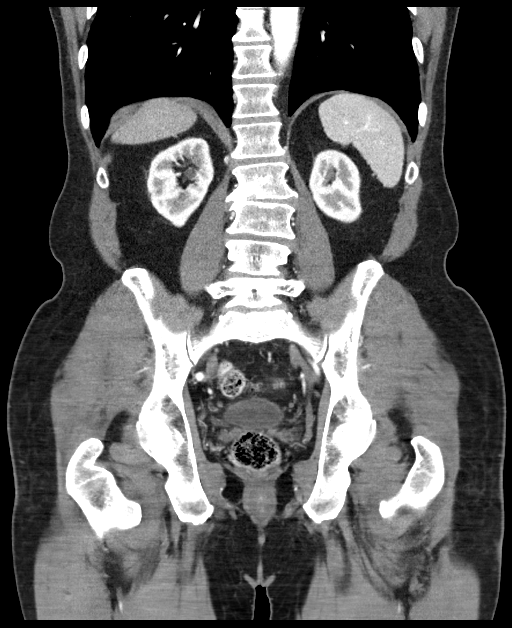
[im 42/95  soft-tissue]
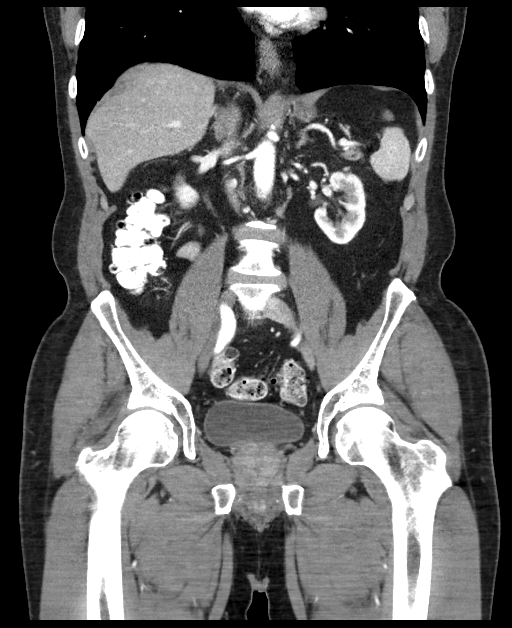
[im 53/95  soft-tissue]
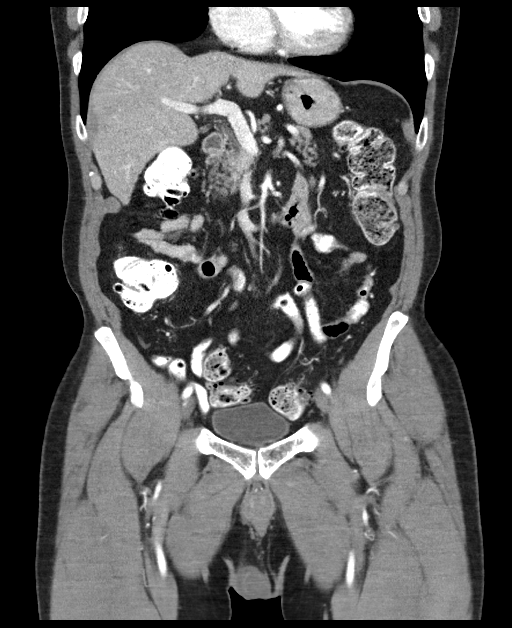

[16 of 46 positions shown; findings below may reference images not displayed]

FINDINGS: Lower chest: No significant pulmonary nodules or acute consolidative
airspace disease.

Hepatobiliary: Normal liver size. No liver mass. Normal gallbladder
with no radiopaque cholelithiasis. No biliary ductal dilatation.

Pancreas: Normal, with no mass or duct dilation.

Spleen: Normal size. No mass.

Adrenals/Urinary Tract: Normal adrenals. Normal kidneys with no
hydronephrosis and no renal mass. Normal bladder.

Stomach/Bowel: Small hiatal hernia. Otherwise normal nondistended
stomach. Normal caliber small bowel with no small bowel wall
thickening. Normal appendix. Oral contrast transits to the rectum.
Moderate diffuse colonic diverticulosis, most prominent in the
sigmoid colon, with no large bowel wall thickening or significant
pericolonic fat stranding.

Vascular/Lymphatic: Normal caliber abdominal aorta. Patent portal,
splenic, hepatic and renal veins. No pathologically enlarged lymph
nodes in the abdomen or pelvis.

Reproductive: Normal size prostate.

Other: No pneumoperitoneum or ascites. Chronic 4.0 cm cystic focus
in the right scrotum (series 2/image 93), slightly increased from
3.6 cm on 03/14/2017, differential includes hydrocele or
spermatocele. Vasectomy clips in the bilateral scrotum again noted.

Musculoskeletal: No aggressive appearing focal osseous lesions.
Marked lumbar spondylosis, most prominent at L2-3.
IMPRESSION: 1. No acute abnormality. No evidence of bowel obstruction or acute
bowel inflammation. Moderate diffuse colonic diverticulosis, with no
evidence of acute diverticulitis.
2. Small hiatal hernia.
3. Chronic 4.0 cm cystic focus in the right scrotum, slightly
increased in size since 03/14/2017, differential includes hydrocele
or spermatocele. Scrotal ultrasound correlation may be obtained as
clinically warranted.

## 2022-01-30 DIAGNOSIS — Z79899 Other long term (current) drug therapy: Secondary | ICD-10-CM | POA: Diagnosis not present

## 2022-01-30 DIAGNOSIS — L4050 Arthropathic psoriasis, unspecified: Secondary | ICD-10-CM | POA: Diagnosis not present

## 2022-03-27 DIAGNOSIS — Z79899 Other long term (current) drug therapy: Secondary | ICD-10-CM | POA: Diagnosis not present

## 2022-03-27 DIAGNOSIS — L4050 Arthropathic psoriasis, unspecified: Secondary | ICD-10-CM | POA: Diagnosis not present

## 2022-03-28 LAB — BASIC METABOLIC PANEL
BUN: 14 (ref 4–21)
CO2: 17 (ref 13–22)
Chloride: 106 (ref 99–108)
Creatinine: 1.1 (ref 0.6–1.3)
Glucose: 73
Potassium: 4.7 mEq/L (ref 3.5–5.1)
Sodium: 145 (ref 137–147)

## 2022-03-28 LAB — CBC AND DIFFERENTIAL
HCT: 42 (ref 41–53)
Hemoglobin: 14 (ref 13.5–17.5)
Platelets: 201 10*3/uL (ref 150–400)
WBC: 6.1

## 2022-03-28 LAB — COMPREHENSIVE METABOLIC PANEL
Albumin: 4.5 (ref 3.5–5.0)
Calcium: 9.6 (ref 8.7–10.7)
eGFR: 72

## 2022-03-28 LAB — HEPATIC FUNCTION PANEL
ALT: 17 U/L (ref 10–40)
AST: 30 (ref 14–40)
Alkaline Phosphatase: 58 (ref 25–125)
Bilirubin, Total: 0.4

## 2022-03-31 DIAGNOSIS — Z6826 Body mass index (BMI) 26.0-26.9, adult: Secondary | ICD-10-CM | POA: Diagnosis not present

## 2022-03-31 DIAGNOSIS — I80201 Phlebitis and thrombophlebitis of unspecified deep vessels of right lower extremity: Secondary | ICD-10-CM | POA: Diagnosis not present

## 2022-03-31 DIAGNOSIS — E663 Overweight: Secondary | ICD-10-CM | POA: Diagnosis not present

## 2022-03-31 DIAGNOSIS — L4050 Arthropathic psoriasis, unspecified: Secondary | ICD-10-CM | POA: Diagnosis not present

## 2022-03-31 DIAGNOSIS — M545 Low back pain, unspecified: Secondary | ICD-10-CM | POA: Diagnosis not present

## 2022-03-31 DIAGNOSIS — L409 Psoriasis, unspecified: Secondary | ICD-10-CM | POA: Diagnosis not present

## 2022-03-31 DIAGNOSIS — Z1589 Genetic susceptibility to other disease: Secondary | ICD-10-CM | POA: Diagnosis not present

## 2022-03-31 DIAGNOSIS — Z79899 Other long term (current) drug therapy: Secondary | ICD-10-CM | POA: Diagnosis not present

## 2022-04-12 ENCOUNTER — Ambulatory Visit: Payer: Medicare PPO | Admitting: Hematology & Oncology

## 2022-04-12 ENCOUNTER — Encounter: Payer: Medicare PPO | Admitting: Family Medicine

## 2022-04-12 ENCOUNTER — Inpatient Hospital Stay: Payer: Medicare PPO

## 2022-04-15 NOTE — Patient Instructions (Incomplete)
It was good to see you today!   I will be in touch with your labs  Keep up the good work with exercise Recommend getting the latest covid 19 shot and a dose of RSV this fall  Think about getting a coronary calcium CT-  I am glad to order this for you if you decide to do so

## 2022-04-15 NOTE — Progress Notes (Unsigned)
[place] [facility] [address], [place], [place] [address] [phone]  Date:  04/19/2022   Name:  Randall Scott   DOB:  Dec 12, 1950   MRN:  [id number]  PCP:  Darreld Mclean, MD    Chief Complaint: No chief complaint on file.   History of Present Illness:  Randall Scott is a 71 y.o. very pleasant male patient who presents with the following:  Pt seen today for a CPE Last visit with myself December 2022 Pt with history of Factor 5 leiden and DVT in 2018- he was treated with xarelto in the past but now just on baby aspirin  He took xarelto for about one year per his recollection Also history of prediabetes, psoriasis/ psoriatic arthritis    His psoriatic arthritis is managed by Dr. 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   Headache(784.0) 08/21/2013   Lateral epicondylitis of right elbow 04/01/2013    Past Medical History:  Diagnosis Date   Allergy    Arthritis    Asthma    Cataract    GERD (gastroesophageal reflux disease)     Past Surgical History:  Procedure Laterality Date   COLONOSCOPY  08/30/2021   EYE SURGERY     HERNIA REPAIR     PERIPHERAL VASCULAR THROMBECTOMY Right    RLE   UPPER GASTROINTESTINAL ENDOSCOPY  08/30/2021    Social History   Tobacco Use   Smoking status: Never   Smokeless tobacco: Never  Vaping Use   Vaping Use: Never used  Substance Use Topics   Alcohol use: No   Drug use: No    Family History  Problem Relation Age of Onset    Lung cancer Mother    Lung cancer Father    Sudden death Neg Hx    Hypertension Neg Hx    Hyperlipidemia Neg Hx    Heart attack Neg Hx    Diabetes Neg Hx    Colon cancer Neg Hx    Rectal cancer Neg Hx    Esophageal cancer Neg Hx    Pancreatic cancer Neg Hx    Stomach cancer Neg Hx     No Known Allergies  Medication list has been reviewed and updated.  Current Outpatient Medications on File Prior to Visit  Medication Sig Dispense Refill   aspirin 81 MG EC tablet Take 162 mg by mouth daily.     azelastine (ASTELIN) 0.1 % nasal spray Place 2 sprays into both nostrils 2 (two) times daily. Use in each nostril as directed (Patient not taking: Reported on 11/08/8323) 30 mL 12   folic acid (FOLVITE) 498 MCG tablet Take 1,600 mcg by mouth daily.     golimumab (SIMPONI ARIA) 50 MG/4ML SOLN injection every 8 (eight) weeks.     methotrexate 2.5 MG tablet Take by mouth once a week.     Probiotic Product (PROBIOTIC DAILY PO) Take by mouth.     No current facility-administered medications on file prior to  visit.    Review of Systems:  As per HPI- otherwise negative.   Physical Examination: There were no vitals filed for this visit. There were no vitals filed for this visit. There is no height or weight on file to calculate BMI. Ideal Body Weight:    GEN: no acute distress. HEENT: Atraumatic, Normocephalic.  Ears and Nose: No external deformity. CV: RRR, No M/G/R. No JVD. No thrill. No extra heart sounds. PULM: CTA B, no wheezes, crackles, rhonchi. No retractions. No resp. distress. No accessory muscle use. ABD: S, NT, ND, +BS. No rebound. No HSM. EXTR: No c/c/e PSYCH: Normally interactive. Conversant.    Assessment and Plan: *** Physical exam today- recommend healthy diet and exercise routine  Will plan further follow- up pending labs.  Signed Lamar Blinks, MD

## 2022-04-18 ENCOUNTER — Other Ambulatory Visit: Payer: Self-pay

## 2022-04-18 ENCOUNTER — Inpatient Hospital Stay: Payer: Medicare PPO

## 2022-04-18 ENCOUNTER — Inpatient Hospital Stay: Payer: Medicare PPO | Attending: Hematology & Oncology | Admitting: Hematology & Oncology

## 2022-04-18 ENCOUNTER — Encounter: Payer: Self-pay | Admitting: Hematology & Oncology

## 2022-04-18 VITALS — BP 116/79 | HR 84 | Temp 97.6°F | Resp 18 | Ht 68.0 in | Wt 173.1 lb

## 2022-04-18 DIAGNOSIS — Z86718 Personal history of other venous thrombosis and embolism: Secondary | ICD-10-CM | POA: Diagnosis not present

## 2022-04-18 DIAGNOSIS — D6851 Activated protein C resistance: Secondary | ICD-10-CM | POA: Diagnosis not present

## 2022-04-18 DIAGNOSIS — Z7901 Long term (current) use of anticoagulants: Secondary | ICD-10-CM | POA: Diagnosis not present

## 2022-04-18 DIAGNOSIS — Z79899 Other long term (current) drug therapy: Secondary | ICD-10-CM | POA: Insufficient documentation

## 2022-04-18 DIAGNOSIS — Z7982 Long term (current) use of aspirin: Secondary | ICD-10-CM | POA: Diagnosis not present

## 2022-04-18 LAB — D-DIMER, QUANTITATIVE: D-Dimer, Quant: 0.55 ug/mL-FEU — ABNORMAL HIGH (ref 0.00–0.50)

## 2022-04-18 NOTE — Progress Notes (Signed)
Hematology and Oncology Follow Up Visit  Randall Scott [id number] 03-18-1951 71 y.o. 04/18/2022   Principle Diagnosis:  Thrombo-embolic disease of the right saphenous femoral junction Factor V Leiden mutation-heterozygote Superficial thrombophlebitis of the right saphenous vein  Current Therapy:   Xarelto 20 mg by mouth daily-to complete 1 year in August 2019  Xarelto 10 mg p.o. daily-maintenance therapy for 1 year-complete in August 2020 EC ASA 162 mg po q day -- start on 09/16/2021 Xarelto 20 mg p.o. daily-6 weeks to finish up on 08/22/2021      Interim History:  Randall Scott is back for follow-up.  We saw 6 months ago.  He is doing quite well.  He and his family did have a wonderful time down to [place].  They enjoyed themselves.  He took Xarelto while he was down there.  He is being followed by Rheumatology.  I think because he is on methotrexate.  His LFTs are normal.  He has had no problems with his legs.  He has had no leg swelling.  He has had no nausea or vomiting.  He has had no bleeding.  He has had no change in bowel or bladder habits.  He has had some bloating lately.  I think he was seen by Gastroenterology.  He did have a upper and lower endoscopy, back in February.  He has had no rashes.  He has had no leg swelling.  He has had no pain.  He has avoided COVID.  Overall, I would say his performance status is probably ECOG 1.    Medications:  Current Outpatient Medications:    aspirin 81 MG EC tablet, Take 162 mg by mouth daily., Disp: , Rfl:    azelastine (ASTELIN) 0.1 % nasal spray, Place 2 sprays into both nostrils 2 (two) times daily. Use in each nostril as directed, Disp: 30 mL, Rfl: 12   folic acid (FOLVITE) 132 MCG tablet, Take 1,600 mcg by mouth daily., Disp: , Rfl:    golimumab (SIMPONI ARIA) 50 MG/4ML SOLN injection, every 8 (eight) weeks., Disp: , Rfl:    methotrexate 2.5 MG tablet, Take by mouth once a week., Disp: , Rfl:    omeprazole (PRILOSEC) 40 MG  capsule, Take 40 mg by mouth daily., Disp: , Rfl:    Probiotic Product (PROBIOTIC DAILY PO), Take by mouth., Disp: , Rfl:   Allergies: No Known Allergies  Past Medical History, Surgical history, Social history, and Family History were reviewed and updated.  Review of Systems: Review of Systems  Constitutional:  Negative for appetite change, fatigue, fever and unexpected weight change.  HENT:   Negative for lump/mass, mouth sores, sore throat and trouble swallowing.   Respiratory:  Negative for cough, hemoptysis and shortness of breath.   Cardiovascular:  Negative for leg swelling and palpitations.  Gastrointestinal:  Negative for abdominal distention, abdominal pain, blood in stool, constipation, diarrhea, nausea and vomiting.  Genitourinary:  Negative for bladder incontinence, dysuria, frequency and hematuria.   Musculoskeletal:  Negative for arthralgias, back pain, gait problem and myalgias.  Skin:  Negative for itching and rash.  Neurological:  Negative for dizziness, extremity weakness, gait problem, headaches, numbness, seizures and speech difficulty.  Hematological:  Does not bruise/bleed easily.  Psychiatric/Behavioral:  Negative for depression and sleep disturbance. The patient is not nervous/anxious.     Physical Exam:  height is '5\' 8"'$  (1.727 m) and weight is 173 lb 1.9 oz (78.5 kg). His oral temperature is 97.6 F (36.4 C). His blood  pressure is 116/79 and his pulse is 84. His respiration is 18 and oxygen saturation is 100%.   Wt Readings from Last 3 Encounters:  04/18/22 173 lb 1.9 oz (78.5 kg)  11/10/21 174 lb (78.9 kg)  08/31/21 176 lb 12.8 oz (80.2 kg)    Physical Exam Vitals reviewed.  HENT:     Head: Normocephalic and atraumatic.  Eyes:     Pupils: Pupils are equal, round, and reactive to light.  Cardiovascular:     Rate and Rhythm: Normal rate and regular rhythm.     Heart sounds: Normal heart sounds.  Pulmonary:     Effort: Pulmonary effort is normal.      Breath sounds: Normal breath sounds.  Abdominal:     General: Bowel sounds are normal.     Palpations: Abdomen is soft.  Musculoskeletal:        General: No tenderness or deformity. Normal range of motion.     Cervical back: Normal range of motion.  Lymphadenopathy:     Cervical: No cervical adenopathy.  Skin:    General: Skin is warm and dry.     Findings: No erythema or rash.  Neurological:     Mental Status: He is alert and oriented to person, place, and time.  Psychiatric:        Behavior: Behavior normal.        Thought Content: Thought content normal.        Judgment: Judgment normal.      Lab Results  Component Value Date   WBC 5.5 11/10/2021   HGB 13.9 11/10/2021   HCT 40.5 11/10/2021   MCV 99.0 11/10/2021   PLT 203 11/10/2021     Chemistry      Component Value Date/Time   NA 141 11/10/2021 0802   NA 139 03/10/2020 0000   NA 147 (H) 04/13/2017 0936   K 4.3 11/10/2021 0802   K 4.4 04/13/2017 0936   CL 106 11/10/2021 0802   CL 108 04/13/2017 0936   CO2 27 11/10/2021 0802   CO2 33 04/13/2017 0936   BUN 19 11/10/2021 0802   BUN 16 03/10/2020 0000   BUN 13 04/13/2017 0936   CREATININE 1.27 (H) 11/10/2021 0802   CREATININE 1.5 (H) 04/13/2017 0936   GLU 83 03/10/2020 0000      Component Value Date/Time   CALCIUM 9.2 11/10/2021 0802   CALCIUM 10.0 04/13/2017 0936   ALKPHOS 45 11/10/2021 0802   ALKPHOS 51 04/13/2017 0936   AST 19 11/10/2021 0802   ALT 15 11/10/2021 0802   ALT 34 04/13/2017 0936   BILITOT 0.7 11/10/2021 0802      Impression and Plan: Randall Scott is a 71 year old male. He has a Factor V Leiden mutation. He is heterozygous for this.  So far, things look pretty good.  We will just follow him along every 6 months.  He is on baby aspirin.  I think his next trip is coming to the beach next June.  I am happy that his quality of life is doing well.  If he has any problems before 6 months, he can was come back to see [place].     Volanda Napoleon,  MD 10/17/20238:30 AM

## 2022-04-19 ENCOUNTER — Other Ambulatory Visit: Payer: Self-pay

## 2022-04-19 ENCOUNTER — Ambulatory Visit (INDEPENDENT_AMBULATORY_CARE_PROVIDER_SITE_OTHER): Payer: Medicare PPO | Admitting: Family Medicine

## 2022-04-19 ENCOUNTER — Encounter: Payer: Self-pay | Admitting: Family Medicine

## 2022-04-19 ENCOUNTER — Other Ambulatory Visit (INDEPENDENT_AMBULATORY_CARE_PROVIDER_SITE_OTHER): Payer: Medicare PPO

## 2022-04-19 VITALS — BP 118/80 | HR 70 | Temp 97.8°F | Resp 18 | Ht 68.0 in | Wt 173.8 lb

## 2022-04-19 DIAGNOSIS — Z13220 Encounter for screening for lipoid disorders: Secondary | ICD-10-CM | POA: Diagnosis not present

## 2022-04-19 DIAGNOSIS — Z125 Encounter for screening for malignant neoplasm of prostate: Secondary | ICD-10-CM

## 2022-04-19 DIAGNOSIS — D6851 Activated protein C resistance: Secondary | ICD-10-CM

## 2022-04-19 DIAGNOSIS — R7309 Other abnormal glucose: Secondary | ICD-10-CM

## 2022-04-19 DIAGNOSIS — Z0000 Encounter for general adult medical examination without abnormal findings: Secondary | ICD-10-CM | POA: Diagnosis not present

## 2022-04-19 DIAGNOSIS — Z23 Encounter for immunization: Secondary | ICD-10-CM | POA: Diagnosis not present

## 2022-04-19 LAB — LIPID PANEL
Cholesterol: 147 mg/dL (ref 0–200)
HDL: 49.5 mg/dL (ref >39.00)
LDL Cholesterol: 83 mg/dL (ref 0–99)
NonHDL: 97.32
Total CHOL/HDL Ratio: 3
Triglycerides: 73 mg/dL (ref 0.0–149.0)
VLDL: 14.6 mg/dL (ref 0.0–40.0)

## 2022-04-19 LAB — HEMOGLOBIN A1C: Hgb A1c MFr Bld: 5.8 % (ref 4.6–6.5)

## 2022-04-19 LAB — PSA: PSA: 1.5 ng/mL (ref 0.10–4.00)

## 2022-05-22 DIAGNOSIS — Z79899 Other long term (current) drug therapy: Secondary | ICD-10-CM | POA: Diagnosis not present

## 2022-05-22 DIAGNOSIS — L4050 Arthropathic psoriasis, unspecified: Secondary | ICD-10-CM | POA: Diagnosis not present

## 2022-06-01 DIAGNOSIS — Z79899 Other long term (current) drug therapy: Secondary | ICD-10-CM | POA: Diagnosis not present

## 2022-06-01 DIAGNOSIS — L4050 Arthropathic psoriasis, unspecified: Secondary | ICD-10-CM | POA: Diagnosis not present

## 2022-07-12 DIAGNOSIS — G548 Other nerve root and plexus disorders: Secondary | ICD-10-CM | POA: Diagnosis not present

## 2022-07-12 DIAGNOSIS — L821 Other seborrheic keratosis: Secondary | ICD-10-CM | POA: Diagnosis not present

## 2022-07-12 DIAGNOSIS — L814 Other melanin hyperpigmentation: Secondary | ICD-10-CM | POA: Diagnosis not present

## 2022-07-12 DIAGNOSIS — I781 Nevus, non-neoplastic: Secondary | ICD-10-CM | POA: Diagnosis not present

## 2022-07-18 DIAGNOSIS — Z79899 Other long term (current) drug therapy: Secondary | ICD-10-CM | POA: Diagnosis not present

## 2022-07-18 DIAGNOSIS — L4050 Arthropathic psoriasis, unspecified: Secondary | ICD-10-CM | POA: Diagnosis not present

## 2022-07-19 ENCOUNTER — Telehealth: Payer: Self-pay | Admitting: Family Medicine

## 2022-07-19 NOTE — Telephone Encounter (Signed)
Copied from [place] [id number]. Topic: Medicare AWV >> Jul 19, 2022 11:05 AM Devoria Glassing wrote: Reason for CRM: Left message for patient to schedule Annual Wellness Visit.  Please schedule with Health Nurse Advisor at [facility]. Call [place] at [phone]

## 2022-07-20 IMAGING — US US EXTREM LOW VENOUS*R*
1 series · 14 of 24 positions shown · non-contrast
Comparison: 08/31/2021

CLINICAL DATA: Evaluate progression of superficial thrombophlebitis

EXAM:
RIGHT LOWER EXTREMITY VENOUS DOPPLER ULTRASOUND
TECHNIQUE: Gray-scale sonography with compression, as well as color and duplex
ultrasound, were performed to evaluate the deep venous system(s)
from the level of the common femoral vein through the popliteal and
proximal calf veins.

[Series 1: us extrem low venous*right* · 14 of 42 slices shown]
[im 1/42]
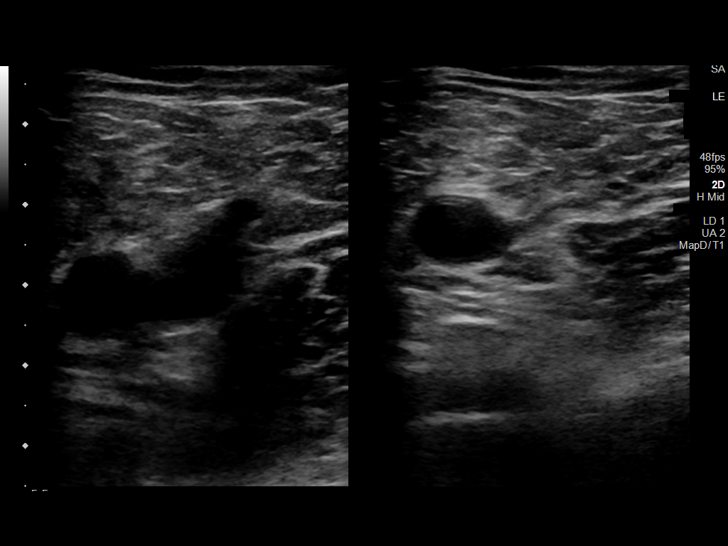
[im 4/42]
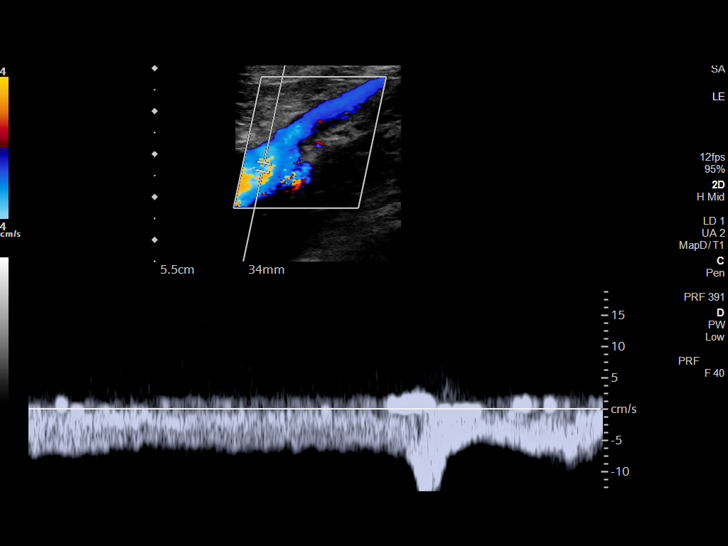
[im 8/42]
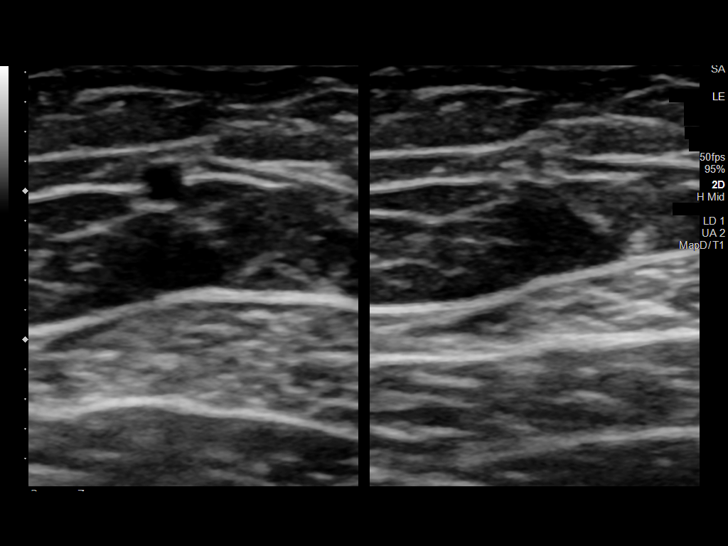
[im 11/42]
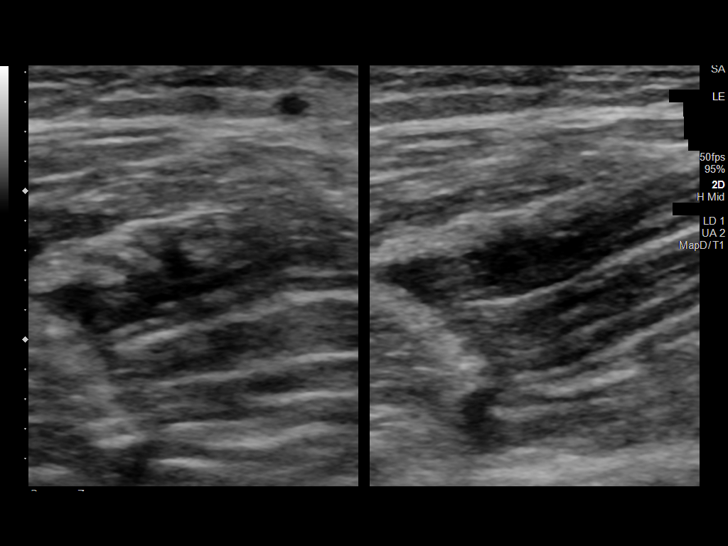
[im 13/42]
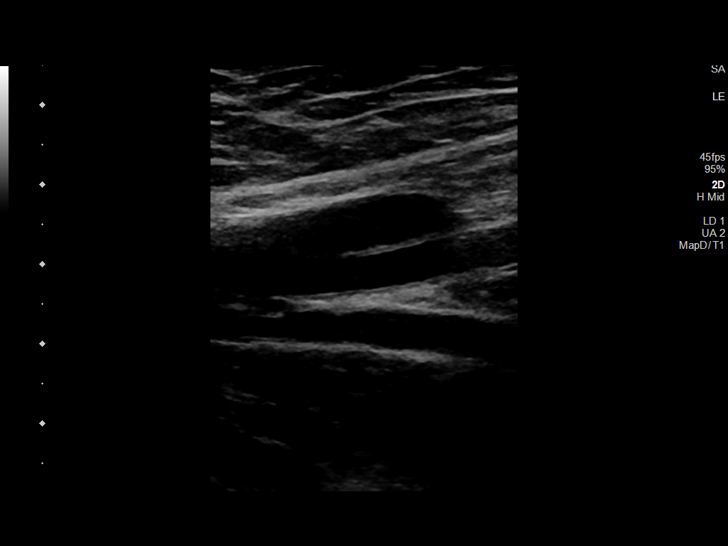
[im 17/42]
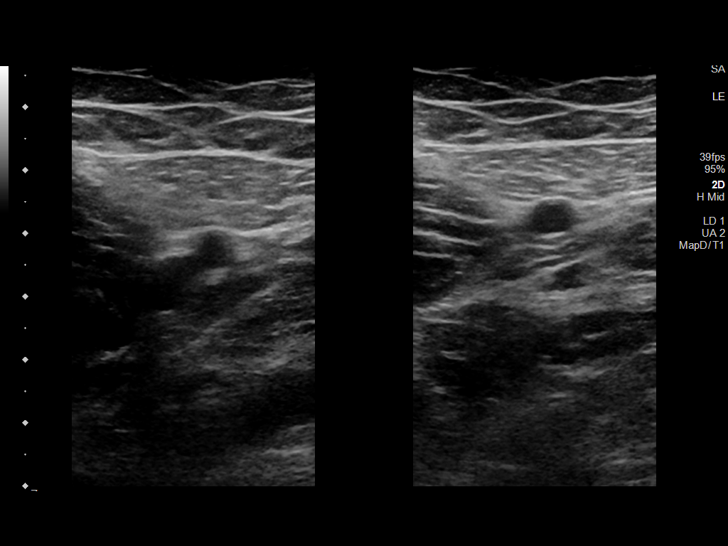
[im 20/42]
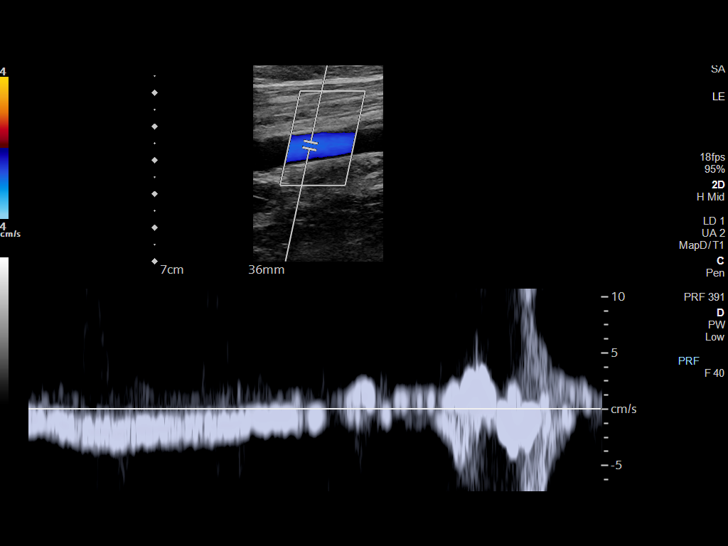
[im 22/42]
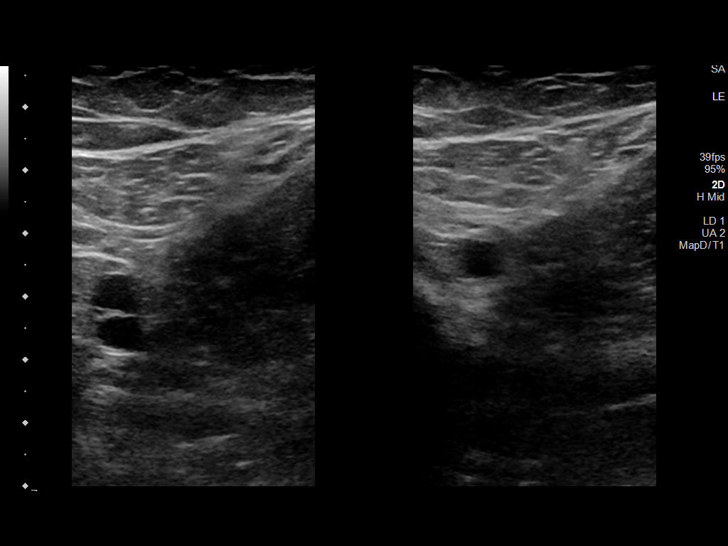
[im 25/42]
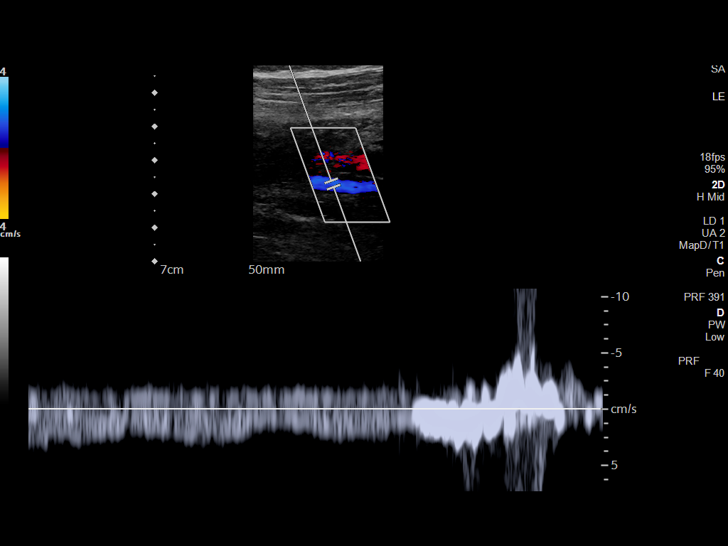
[im 29/42]
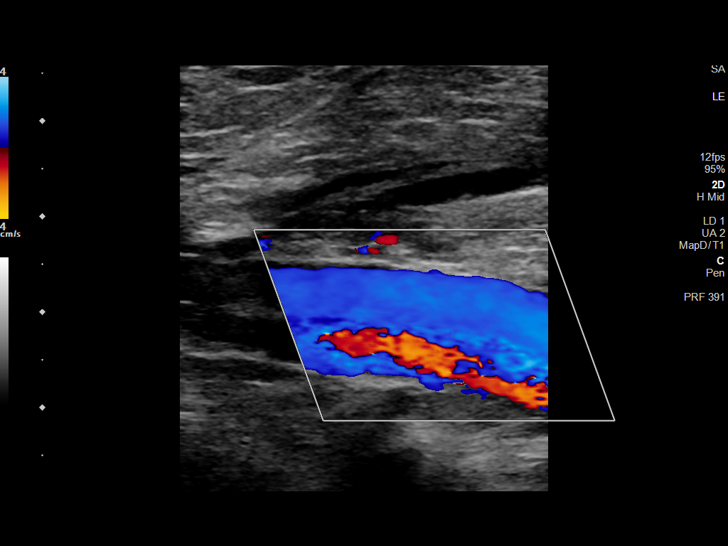
[im 33/42]
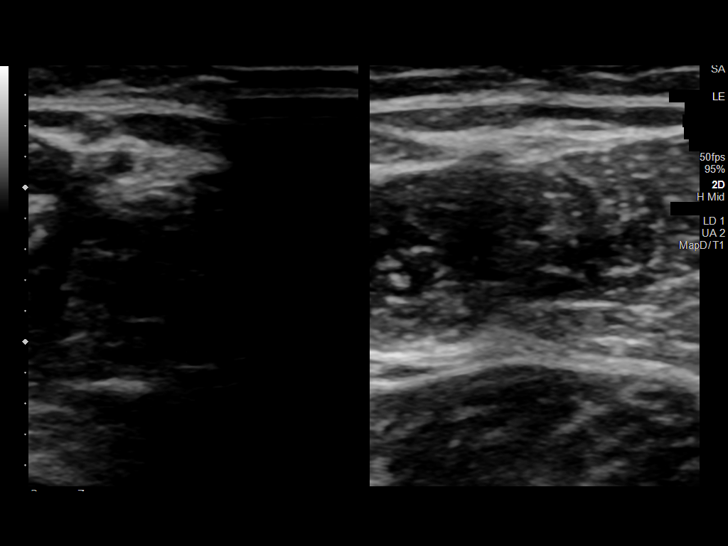
[im 34/42]
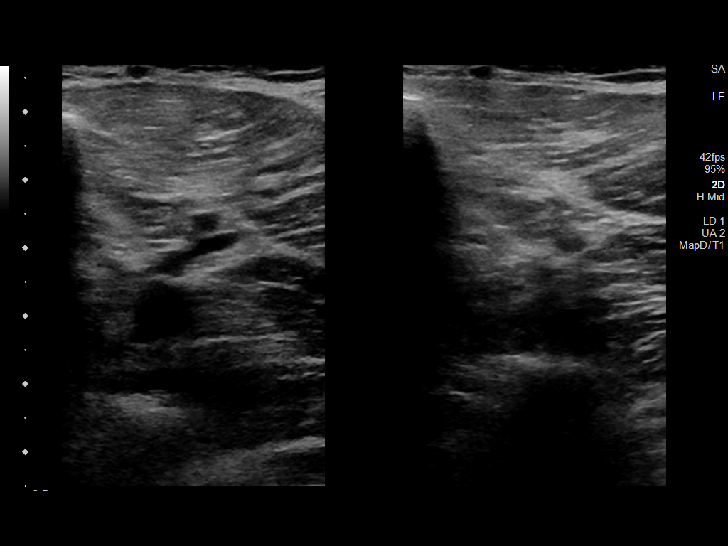
[im 38/42]
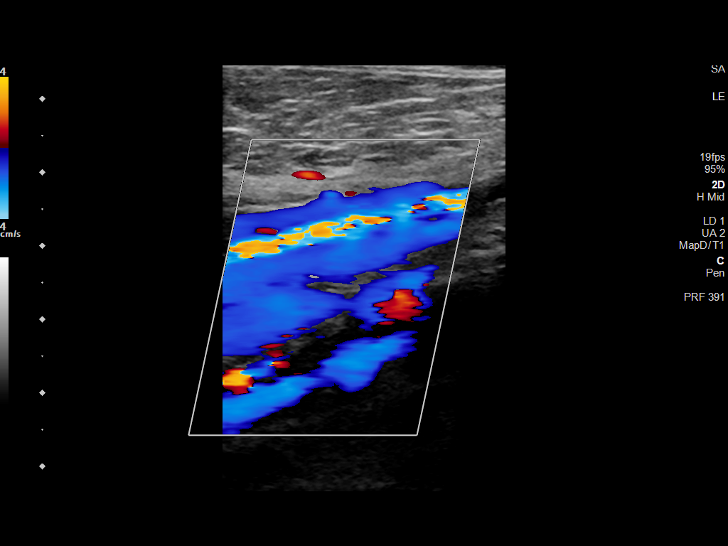
[im 42/42]
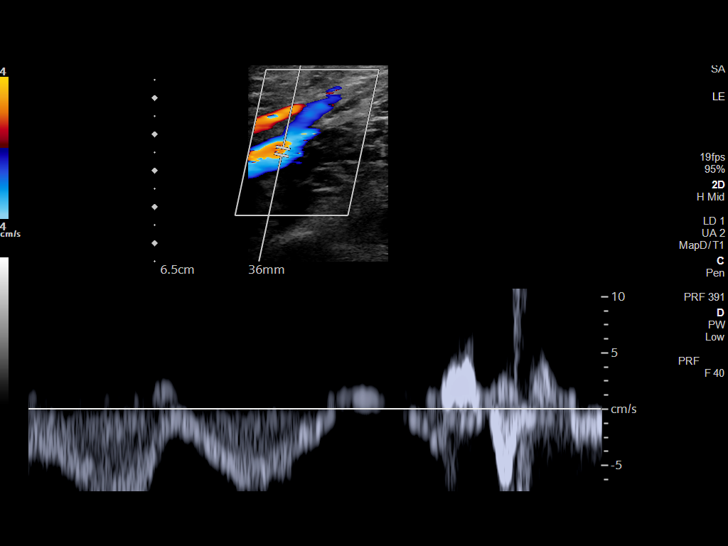

[14 of 24 positions shown; findings below may reference images not displayed]

FINDINGS: VENOUS

Normal compressibility of the common femoral, superficial femoral,
and popliteal veins, as well as the visualized calf veins.
Visualized portions of profunda femoral vein are unremarkable. No
filling defects to suggest DVT on grayscale or color Doppler
imaging. Doppler waveforms show normal direction of venous flow,
normal respiratory plasticity and response to augmentation.

Limited views of the contralateral common femoral vein are
unremarkable.

OTHER

None.

Limitations: none
IMPRESSION: 1. Interval resolution of previously seen right greater and small
saphenous vein thrombus.
2. No right lower extremity DVT.

## 2022-08-21 ENCOUNTER — Ambulatory Visit (INDEPENDENT_AMBULATORY_CARE_PROVIDER_SITE_OTHER): Payer: Medicare PPO | Admitting: *Deleted

## 2022-08-21 DIAGNOSIS — Z0000 Encounter for general adult medical examination without abnormal findings: Secondary | ICD-10-CM | POA: Diagnosis not present

## 2022-08-21 NOTE — Progress Notes (Signed)
Subjective:   Randall Scott is a 72 y.o. male who presents for Medicare Annual/Subsequent preventive examination.  I connected with  [name] on 08/21/22 by a audio enabled telemedicine application and verified that I am speaking with the correct person using two identifiers.  Patient Location: Home  Provider Location: Office/Clinic  I discussed the limitations of evaluation and management by telemedicine. The patient expressed understanding and agreed to proceed.   Review of Systems    Defer to PCP Cardiac Risk Factors include: advanced age (>57mn, >>33women);male gender     Objective:    There were no vitals filed for this visit. There is no height or weight on file to calculate BMI.     08/21/2022    8:22 AM 04/18/2022    8:24 AM 11/10/2021    9:42 AM 08/31/2021   10:04 AM 07/27/2021    2:07 PM 07/12/2021    2:38 PM 01/06/2021    3:49 PM  Advanced Directives  Does Patient Have a Medical Advance Directive? Yes Yes Yes Yes Yes Yes Yes  Type of [place]aramed[place]of [place] will [place] will Living will;Healthcare Power of [place]of [place]of [place]Living will  Does patient want to make changes to medical advance directive? No - Patient declined No - Patient declined No - Patient declined      Copy of HBuffaloin Chart? Yes - validated most recent copy scanned in chart (See row information) Yes - validated most recent copy scanned in chart (See row information)  Yes - validated most recent copy scanned in chart (See row information) Yes - validated most recent copy scanned in chart (See row information) Yes - validated most recent copy scanned in chart (See row information)     Current Medications (verified) Outpatient Encounter Medications as of 08/21/2022  Medication Sig   aspirin 81 MG EC tablet Take 162 mg  by mouth daily.   azelastine (ASTELIN) 0.1 % nasal spray Place 2 sprays into both nostrils 2 (two) times daily. Use in each nostril as directed   folic acid (FOLVITE) 4A999333MCG tablet Take 1,600 mcg by mouth daily.   golimumab (SIMPONI ARIA) 50 MG/4ML SOLN injection every 8 (eight) weeks.   methotrexate 2.5 MG tablet Take by mouth once a week.   omeprazole (PRILOSEC) 40 MG capsule Take 40 mg by mouth daily.   Probiotic Product (PROBIOTIC DAILY PO) Take by mouth.   No facility-administered encounter medications on file as of 08/21/2022.    Allergies (verified) Patient has no known allergies.   History: Past Medical History:  Diagnosis Date   Allergy    Arthritis    Asthma    Cataract    GERD (gastroesophageal reflux disease)    Past Surgical History:  Procedure Laterality Date   COLONOSCOPY  08/30/2021   EYE SURGERY     HERNIA REPAIR     PERIPHERAL VASCULAR THROMBECTOMY Right    RLE   UPPER GASTROINTESTINAL ENDOSCOPY  08/30/2021   Family History  Problem Relation Age of Onset   Lung cancer Mother    Lung cancer Father    Sudden death Neg Hx    Hypertension Neg Hx    Hyperlipidemia Neg Hx    Heart attack Neg Hx    Diabetes Neg Hx    Colon cancer Neg Hx    Rectal cancer Neg Hx  Esophageal cancer Neg Hx    Pancreatic cancer Neg Hx    Stomach cancer Neg Hx    Social History   Socioeconomic History   Marital status: Married    Spouse name: Caren Griffins   Number of children: 2   Years of education: 12   Highest education level: Not on file  Occupational History   Occupation: Information systems manager: [employer]   Occupation: retired  Tobacco Use   Smoking status: Never   Smokeless tobacco: Never  Scientific laboratory technician Use: Never used  Substance and Sexual Activity   Alcohol use: No   Drug use: No   Sexual activity: Yes    Birth control/protection: None    Comment: married  Other Topics Concern   Not on file  Social History Narrative   Lives with  his wife.  Daughters are grown and live nearby.   Patient is right handed   Education level is 12   Caffeine consumption is 2-3 cups daily   Social Determinants of Health   Financial Resource Strain: Low Risk  (07/12/2021)   Overall Financial Resource Strain (CARDIA)    Difficulty of Paying Living Expenses: Not hard at all  Food Insecurity: No Food Insecurity (08/21/2022)   Hunger Vital Sign    Worried About Running Out of Food in the Last Year: Never true    Ran Out of Food in the Last Year: Never true  Transportation Needs: No Transportation Needs (08/21/2022)   PRAPARE - Hydrologist (Medical): No    Lack of Transportation (Non-Medical): No  Physical Activity: Sufficiently Active (07/12/2021)   Exercise Vital Sign    Days of Exercise per Week: 4 days    Minutes of Exercise per Session: 60 min  Stress: No Stress Concern Present (07/12/2021)   [place]    Feeling of Stress : Not at all  Social Connections: Moderately Integrated (07/12/2021)   Social Connection and Isolation Panel [NHANES]    Frequency of Communication with Friends and Family: Three times a week    Frequency of Social Gatherings with Friends and Family: More than three times a week    Attends Religious Services: More than 4 times per year    Active Member of [employer] or Organizations: No    Attends Music therapist: Never    Marital Status: Married    Tobacco Counseling Counseling given: Not Answered   Clinical Intake:  Pre-visit preparation completed: Yes  Pain : No/denies pain  Diabetes: No  How often do you need to have someone help you when you read instructions, pamphlets, or other written materials from your doctor or pharmacy?: 1 - Never  Activities of Daily Living    08/21/2022    8:25 AM  In your present state of health, do you have any difficulty performing the following activities:   Hearing? 0  Comment wears hearing aids  Vision? 0  Difficulty concentrating or making decisions? 0  Walking or climbing stairs? 0  Dressing or bathing? 0  Doing errands, shopping? 0  Preparing Food and eating ? N  Using the Toilet? N  In the past six months, have you accidently leaked urine? N  Do you have problems with loss of bowel control? N  Managing your Medications? N  Managing your Finances? N  Housekeeping or managing your Housekeeping? N    Patient Care Team: Copland, Gay Filler, MD  as PCP - General (Family Medicine) Maylon Peppers, MD (Family Medicine) Volanda Napoleon, MD as Consulting Physician (Oncology) Clark-Burning, Anderson Malta, PA-C (Inactive) (Dermatology)  Indicate any recent Medical Services you may have received from other than [facility] providers in the past year (date may be approximate).     Assessment:   This is a routine wellness examination for [place].  Hearing/Vision screen No results found.  Dietary issues and exercise activities discussed: Current Exercise Habits: Home exercise routine, Type of exercise: walking;Other - see comments (has "total gym" machine at home), Time (Minutes): 30, Frequency (Times/Week): 5, Weekly Exercise (Minutes/Week): 150, Intensity: Mild, Exercise limited by: None identified   Goals Addressed   None    Depression Screen    08/21/2022    8:25 AM 04/19/2022    9:25 AM 08/19/2021    1:09 PM 07/12/2021    2:37 PM 04/25/2021   10:17 AM 04/22/2020    9:05 AM 04/14/2019    9:04 AM  PHQ 2/9 Scores  PHQ - 2 Score 0 0 0 0 0 0 0    Fall Risk    08/21/2022    8:22 AM 04/19/2022    9:25 AM 08/19/2021    1:09 PM 07/12/2021    2:39 PM 04/25/2021   10:16 AM  [place] in the past year? 0 1 0 0 0  Number falls in past yr: 0 0 0 0 0  Injury with Fall? 0 1 0 0 0  Risk for fall due to : No Fall Risks  No Fall Risks Impaired vision   Follow up Falls evaluation completed Falls evaluation completed Falls evaluation completed  Falls prevention discussed     FALL RISK PREVENTION PERTAINING TO THE HOME:  Any stairs in or around the home? No  Home free of loose throw rugs in walkways, pet beds, electrical cords, etc? Yes  Adequate lighting in your home to reduce risk of falls? Yes   ASSISTIVE DEVICES UTILIZED TO PREVENT FALLS:  Life alert? No  Use of a cane, walker or w/c? No  Grab bars in the bathroom? No  Shower chair or bench in shower? No  Elevated toilet seat or a handicapped toilet? No   TIMED UP AND GO:  Was the test performed?  No, audio visit .   Cognitive Function:    04/09/2017   10:09 AM  MMSE - Mini Mental State Exam  Orientation to time 5  Orientation to Place 5  Registration 3  Attention/ Calculation 5  Recall 3  Language- name 2 objects 2  Language- repeat 1  Language- follow 3 step command 3  Language- read & follow direction 1  Write a sentence 1  Copy design 1  Total score 30        08/21/2022    8:30 AM 07/12/2021    2:43 PM  6CIT Screen  What Year? 0 points 0 points  What month? 0 points 0 points  What time? 0 points 0 points  Count back from 20 0 points 0 points  Months in reverse 0 points 0 points  Repeat phrase 0 points 0 points  Total Score 0 points 0 points    Immunizations Immunization History  Administered Date(s) Administered   Fluad Quad(high Dose 65+) 03/16/2019, 04/22/2020, 04/25/2021, 04/19/2022   Influenza Split 07/04/2006   Influenza, High Dose Seasonal PF 04/06/2016, 04/09/2017, 04/11/2018   Influenza,inj,Quad PF,6+ Mos 03/25/2013, 04/21/2014, 05/10/2015   PFIZER(Purple Top)SARS-COV-2 Vaccination 08/05/2019, 08/23/2019, 03/03/2020  Pneumococcal Conjugate-13 04/06/2016   Pneumococcal Polysaccharide-23 04/09/2017   Tdap 12/26/2012   Zoster Recombinat (Shingrix) 04/25/2018, 07/01/2018    TDAP status: Up to date  Flu Vaccine status: Up to date  Pneumococcal vaccine status: Up to date  Covid-19 vaccine status: Information provided on how to  obtain vaccines.   Qualifies for Shingles Vaccine? Yes   Zostavax completed No   Shingrix Completed?: Yes  Screening Tests Health Maintenance  Topic Date Due   COVID-19 Vaccine (4 - 2023-24 season) 03/03/2022   Medicare Annual Wellness (AWV)  07/12/2022   DTaP/Tdap/Td (2 - Td or Tdap) 12/27/2022   COLONOSCOPY (Pts 45-6yr Insurance coverage will need to be confirmed)  08/31/2031   Pneumonia Vaccine 72 Years old  Completed   INFLUENZA VACCINE  Completed   Hepatitis C Screening  Completed   Zoster Vaccines- Shingrix  Completed   HPV VACCINES  Aged Out    Health Maintenance  Health Maintenance Due  Topic Date Due   COVID-19 Vaccine (4 - 2023-24 season) 03/03/2022   Medicare Annual Wellness (AWV)  07/12/2022    Colorectal cancer screening: Type of screening: Colonoscopy. Completed 08/30/21. Repeat every 10 years  Lung Cancer Screening: (Low Dose CT Chest recommended if Age 72-80years, 30 pack-year currently smoking OR have quit w/in 15years.) does not qualify.   Additional Screening:  Hepatitis C Screening: does qualify; Completed 03/10/20  Vision Screening: Recommended annual ophthalmology exams for early detection of glaucoma and other disorders of the eye. Is the patient up to date with their annual eye exam?  No  Who is the provider or what is the name of the office in which the patient attends annual eye exams? [place]If pt is not established with a provider, would they like to be referred to a provider to establish care? No .   Dental Screening: Recommended annual dental exams for proper oral hygiene  Community Resource Referral / Chronic Care Management: CRR required this visit?  No   CCM required this visit?  No      Plan:     I have personally reviewed and noted the following in the patient's chart:   Medical and social history Use of alcohol, tobacco or illicit drugs  Current medications and supplements including opioid  prescriptions. Patient is not currently taking opioid prescriptions. Functional ability and status Nutritional status Physical activity Advanced directives List of other physicians Hospitalizations, surgeries, and ER visits in previous 12 months Vitals Screenings to include cognitive, depression, and falls Referrals and appointments  In addition, I have reviewed and discussed with patient certain preventive protocols, quality metrics, and best practice recommendations. A written personalized care plan for preventive services as well as general preventive health recommendations were provided to patient.   Due to this being a telephonic visit, the after visit summary with patients personalized plan was offered to patient via mail or my-chart. Patient would like to access on my-chart.  BBeatris Ship [place]regon  08/21/2022   Nurse Notes: None

## 2022-08-21 NOTE — Patient Instructions (Signed)
Mr. Randall Scott , Thank you for taking time to come for your Medicare Wellness Visit. I appreciate your ongoing commitment to your health goals. Please review the following plan we discussed and let me know if I can assist you in the future.   These are the goals we discussed:  Goals       maintain current healthy lifestyle (pt-stated)      Patient Stated      Live long        This is a list of the screening recommended for you and due dates:  Health Maintenance  Topic Date Due   COVID-19 Vaccine (4 - 2023-24 season) 03/03/2022   DTaP/Tdap/Td vaccine (2 - Td or Tdap) 12/27/2022   Medicare Annual Wellness Visit  08/22/2023   Colon Cancer Screening  08/31/2031   Pneumonia Vaccine  Completed   Flu Shot  Completed   Hepatitis C Screening: USPSTF Recommendation to screen - Ages 18-79 yo.  Completed   Zoster (Shingles) Vaccine  Completed   HPV Vaccine  Aged Out    Next appointment: Follow up in one year for your annual wellness visit.   Preventive Care 46 Years and Older, Male Preventive care refers to lifestyle choices and visits with your health care provider that can promote health and wellness. What does preventive care include? A yearly physical exam. This is also called an annual well check. Dental exams once or twice a year. Routine eye exams. Ask your health care provider how often you should have your eyes checked. Personal lifestyle choices, including: Daily care of your teeth and gums. Regular physical activity. Eating a healthy diet. Avoiding tobacco and drug use. Limiting alcohol use. Practicing safe sex. Taking low doses of aspirin every day. Taking vitamin and mineral supplements as recommended by your health care provider. What happens during an annual well check? The services and screenings done by your health care provider during your annual well check will depend on your age, overall health, lifestyle risk factors, and family history of disease. Counseling  Your  health care provider may ask you questions about your: Alcohol use. Tobacco use. Drug use. Emotional well-being. Home and relationship well-being. Sexual activity. Eating habits. History of falls. Memory and ability to understand (cognition). Work and work Statistician. Screening  You may have the following tests or measurements: Height, weight, and BMI. Blood pressure. Lipid and cholesterol levels. These may be checked every 5 years, or more frequently if you are over 26 years old. Skin check. Lung cancer screening. You may have this screening every year starting at age 72 if you have a 30-pack-year history of smoking and currently smoke or have quit within the past 15 years. Fecal occult blood test (FOBT) of the stool. You may have this test every year starting at age 31. Flexible sigmoidoscopy or colonoscopy. You may have a sigmoidoscopy every 5 years or a colonoscopy every 10 years starting at age 74. Prostate cancer screening. Recommendations will vary depending on your family history and other risks. Hepatitis C blood test. Hepatitis B blood test. Sexually transmitted disease (STD) testing. Diabetes screening. This is done by checking your blood sugar (glucose) after you have not eaten for a while (fasting). You may have this done every 1-3 years. Abdominal aortic aneurysm (AAA) screening. You may need this if you are a current or former smoker. Osteoporosis. You may be screened starting at age 10 if you are at high risk. Talk with your health care provider about your test results,  treatment options, and if necessary, the need for more tests. Vaccines  Your health care provider may recommend certain vaccines, such as: Influenza vaccine. This is recommended every year. Tetanus, diphtheria, and acellular pertussis (Tdap, Td) vaccine. You may need a Td booster every 10 years. Zoster vaccine. You may need this after age 17. Pneumococcal 13-valent conjugate (PCV13) vaccine. One dose  is recommended after age 39. Pneumococcal polysaccharide (PPSV23) vaccine. One dose is recommended after age 66. Talk to your health care provider about which screenings and vaccines you need and how often you need them. This information is not intended to replace advice given to you by your health care provider. Make sure you discuss any questions you have with your health care provider. Document Released: 07/16/2015 Document Revised: 03/08/2016 Document Reviewed: 04/20/2015 Elsevier Interactive Patient Education  2017 [employer] Prevention in the Home Falls can cause injuries. They can happen to people of all ages. There are many things you can do to make your home safe and to help prevent falls. What can I do on the outside of my home? Regularly fix the edges of walkways and driveways and fix any cracks. Remove anything that might make you trip as you walk through a door, such as a raised step or threshold. Trim any bushes or trees on the path to your home. Use bright outdoor lighting. Clear any walking paths of anything that might make someone trip, such as rocks or tools. Regularly check to see if handrails are loose or broken. Make sure that both sides of any steps have handrails. Any raised decks and porches should have guardrails on the edges. Have any leaves, snow, or ice cleared regularly. Use sand or salt on walking paths during winter. Clean up any spills in your garage right away. This includes oil or grease spills. What can I do in the bathroom? Use night lights. Install grab bars by the toilet and in the tub and shower. Do not use towel bars as grab bars. Use non-skid mats or decals in the tub or shower. If you need to sit down in the shower, use a plastic, non-slip stool. Keep the floor dry. Clean up any water that spills on the floor as soon as it happens. Remove soap buildup in the tub or shower regularly. Attach bath mats securely with double-sided non-slip rug  tape. Do not have throw rugs and other things on the floor that can make you trip. What can I do in the bedroom? Use night lights. Make sure that you have a light by your bed that is easy to reach. Do not use any sheets or blankets that are too big for your bed. They should not hang down onto the floor. Have a firm chair that has side arms. You can use this for support while you get dressed. Do not have throw rugs and other things on the floor that can make you trip. What can I do in the kitchen? Clean up any spills right away. Avoid walking on wet floors. Keep items that you use a lot in easy-to-reach places. If you need to reach something above you, use a strong step stool that has a grab bar. Keep electrical cords out of the way. Do not use floor polish or wax that makes floors slippery. If you must use wax, use non-skid floor wax. Do not have throw rugs and other things on the floor that can make you trip. What can I do with my stairs? Do  not leave any items on the stairs. Make sure that there are handrails on both sides of the stairs and use them. Fix handrails that are broken or loose. Make sure that handrails are as long as the stairways. Check any carpeting to make sure that it is firmly attached to the stairs. Fix any carpet that is loose or worn. Avoid having throw rugs at the top or bottom of the stairs. If you do have throw rugs, attach them to the floor with carpet tape. Make sure that you have a light switch at the top of the stairs and the bottom of the stairs. If you do not have them, ask someone to add them for you. What else can I do to help prevent falls? Wear shoes that: Do not have high heels. Have rubber bottoms. Are comfortable and fit you well. Are closed at the toe. Do not wear sandals. If you use a stepladder: Make sure that it is fully opened. Do not climb a closed stepladder. Make sure that both sides of the stepladder are locked into place. Ask someone to  hold it for you, if possible. Clearly mark and make sure that you can see: Any grab bars or handrails. First and last steps. Where the edge of each step is. Use tools that help you move around (mobility aids) if they are needed. These include: Canes. Walkers. Scooters. Crutches. Turn on the lights when you go into a dark area. Replace any light bulbs as soon as they burn out. Set up your furniture so you have a clear path. Avoid moving your furniture around. If any of your floors are uneven, fix them. If there are any pets around you, be aware of where they are. Review your medicines with your doctor. Some medicines can make you feel dizzy. This can increase your chance of falling. Ask your doctor what other things that you can do to help prevent falls. This information is not intended to replace advice given to you by your health care provider. Make sure you discuss any questions you have with your health care provider. Document Released: 04/15/2009 Document Revised: 11/25/2015 Document Reviewed: 07/24/2014 Elsevier Interactive Patient Education  2017 [employer].

## 2022-09-12 DIAGNOSIS — R5383 Other fatigue: Secondary | ICD-10-CM | POA: Diagnosis not present

## 2022-09-12 DIAGNOSIS — Z79899 Other long term (current) drug therapy: Secondary | ICD-10-CM | POA: Diagnosis not present

## 2022-09-12 DIAGNOSIS — L4050 Arthropathic psoriasis, unspecified: Secondary | ICD-10-CM | POA: Diagnosis not present

## 2022-10-03 DIAGNOSIS — Z6827 Body mass index (BMI) 27.0-27.9, adult: Secondary | ICD-10-CM | POA: Diagnosis not present

## 2022-10-03 DIAGNOSIS — E663 Overweight: Secondary | ICD-10-CM | POA: Diagnosis not present

## 2022-10-03 DIAGNOSIS — Z79899 Other long term (current) drug therapy: Secondary | ICD-10-CM | POA: Diagnosis not present

## 2022-10-03 DIAGNOSIS — Z1589 Genetic susceptibility to other disease: Secondary | ICD-10-CM | POA: Diagnosis not present

## 2022-10-03 DIAGNOSIS — L409 Psoriasis, unspecified: Secondary | ICD-10-CM | POA: Diagnosis not present

## 2022-10-03 DIAGNOSIS — L4050 Arthropathic psoriasis, unspecified: Secondary | ICD-10-CM | POA: Diagnosis not present

## 2022-10-17 ENCOUNTER — Inpatient Hospital Stay: Payer: Medicare PPO | Attending: Hematology & Oncology

## 2022-10-17 ENCOUNTER — Inpatient Hospital Stay (HOSPITAL_BASED_OUTPATIENT_CLINIC_OR_DEPARTMENT_OTHER): Payer: Medicare PPO | Admitting: Family

## 2022-10-17 VITALS — BP 112/75 | HR 63 | Temp 97.9°F | Resp 17 | Wt 178.0 lb

## 2022-10-17 DIAGNOSIS — Z7982 Long term (current) use of aspirin: Secondary | ICD-10-CM | POA: Diagnosis not present

## 2022-10-17 DIAGNOSIS — I824Y1 Acute embolism and thrombosis of unspecified deep veins of right proximal lower extremity: Secondary | ICD-10-CM

## 2022-10-17 DIAGNOSIS — D6851 Activated protein C resistance: Secondary | ICD-10-CM

## 2022-10-17 DIAGNOSIS — Z8672 Personal history of thrombophlebitis: Secondary | ICD-10-CM | POA: Insufficient documentation

## 2022-10-17 LAB — CMP (CANCER CENTER ONLY)
ALT: 15 U/L (ref 0–44)
AST: 20 U/L (ref 15–41)
Albumin: 4.1 g/dL (ref 3.5–5.0)
Alkaline Phosphatase: 43 U/L (ref 38–126)
Anion gap: 7 (ref 5–15)
BUN: 23 mg/dL (ref 8–23)
CO2: 26 mmol/L (ref 22–32)
Calcium: 9 mg/dL (ref 8.9–10.3)
Chloride: 109 mmol/L (ref 98–111)
Creatinine: 1.34 mg/dL — ABNORMAL HIGH (ref 0.61–1.24)
GFR, Estimated: 56 mL/min — ABNORMAL LOW (ref >60)
Glucose, Bld: 75 mg/dL (ref 70–99)
Potassium: 4.1 mmol/L (ref 3.5–5.1)
Sodium: 142 mmol/L (ref 135–145)
Total Bilirubin: 0.6 mg/dL (ref 0.3–1.2)
Total Protein: 6.4 g/dL — ABNORMAL LOW (ref 6.5–8.1)

## 2022-10-17 LAB — D-DIMER, QUANTITATIVE: D-Dimer, Quant: 0.47 ug/mL-FEU (ref 0.00–0.50)

## 2022-10-17 LAB — CBC WITH DIFFERENTIAL (CANCER CENTER ONLY)
Abs Immature Granulocytes: 0.01 10*3/uL (ref 0.00–0.07)
Basophils Absolute: 0.1 10*3/uL (ref 0.0–0.1)
Basophils Relative: 1 %
Eosinophils Absolute: 0.5 10*3/uL (ref 0.0–0.5)
Eosinophils Relative: 9 %
HCT: 39.8 % (ref 39.0–52.0)
Hemoglobin: 13.6 g/dL (ref 13.0–17.0)
Immature Granulocytes: 0 %
Lymphocytes Relative: 29 %
Lymphs Abs: 1.8 10*3/uL (ref 0.7–4.0)
MCH: 34.4 pg — ABNORMAL HIGH (ref 26.0–34.0)
MCHC: 34.2 g/dL (ref 30.0–36.0)
MCV: 100.8 fL — ABNORMAL HIGH (ref 80.0–100.0)
Monocytes Absolute: 0.6 10*3/uL (ref 0.1–1.0)
Monocytes Relative: 9 %
Neutro Abs: 3.3 10*3/uL (ref 1.7–7.7)
Neutrophils Relative %: 52 %
Platelet Count: 162 10*3/uL (ref 150–400)
RBC: 3.95 MIL/uL — ABNORMAL LOW (ref 4.22–5.81)
RDW: 14 % (ref 11.5–15.5)
WBC Count: 6.2 10*3/uL (ref 4.0–10.5)
nRBC: 0 % (ref 0.0–0.2)

## 2022-10-17 NOTE — Progress Notes (Signed)
Hematology and Oncology Follow Up Visit  Randall Scott [id number] 05/15/1951 72 y.o. 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 daily-maintenance therapy for 1 year-complete in August 2020 Xarelto 20 mg p.o. daily-6 weeks to finish up on 08/22/2021   Current Therapy:        EC ASA 162 mg po q day -- start on 09/16/2021 Xarelto 20 mg PO daily when travelling   Interim History:  Randall Scott is here today for follow-up. He is doing quite well and has no complaints at this time.  He is doing well on 2 baby aspirin daily. No issues with bleeding. No abnormal bruising, no petechiae.  No fever, chills, n/v, cough, rash, dizziness, SOB, chest pain, palpitations, abdominal pain or changes in bowel or bladder habits.  No swelling, tenderness, numbness or tingling in his extremities.  No falls or syncope reported.  Appetite and hydration are good. Weight is stable at 178 lbs.   ECOG Performance Status: 1 - Symptomatic but completely ambulatory  Medications:  Allergies as of 10/17/2022   No Known Allergies      Medication List        Accurate as of October 17, 2022  8:51 AM. If you have any questions, ask your nurse or doctor.          STOP taking these medications    azelastine 0.1 % nasal spray Commonly known as: ASTELIN Stopped by: Eileen Stanford, NP       TAKE these medications    aspirin EC 81 MG tablet Take 162 mg by mouth daily.   folic acid 400 MCG tablet Commonly known as: FOLVITE Take 1,600 mcg by mouth daily.   methotrexate 2.5 MG tablet Commonly known as: RHEUMATREX Take by mouth once a week.   omeprazole 40 MG capsule Commonly known as: PRILOSEC Take 40 mg by mouth daily.   PROBIOTIC DAILY PO Take by mouth.    Simponi Aria 50 MG/4ML Soln injection Generic drug: golimumab every 8 (eight) weeks.        Allergies: No Known Allergies  Past Medical History, Surgical history, Social history, and Family History were reviewed and updated.  Review of Systems: All other 10 point review of systems is negative.   Physical Exam:  weight is 178 lb (80.7 kg). His oral temperature is 97.9 F (36.6 C). His blood pressure is 112/75 and his pulse is 63. His respiration is 17 and oxygen saturation is 100%.   Wt Readings from Last 3 Encounters:  10/17/22 178 lb (80.7 kg)  04/19/22 173 lb 12.8 oz (78.8 kg)  04/18/22 173 lb 1.9 oz (78.5 kg)    Ocular: Sclerae unicteric, pupils equal, round and reactive to light Ear-nose-throat: Oropharynx clear, dentition fair Lymphatic: No cervical or supraclavicular adenopathy Lungs no rales or rhonchi, good excursion bilaterally Heart regular rate and rhythm, no murmur appreciated Abd soft, nontender, positive bowel sounds MSK no focal spinal tenderness, no joint edema Neuro: non-focal, well-oriented, appropriate affect Breasts: Deferred   Lab Results  Component Value Date   WBC 6.2 10/17/2022   HGB 13.6 10/17/2022   HCT 39.8 10/17/2022   MCV 100.8 (H) 10/17/2022   PLT 162 10/17/2022   No results found for: "FERRITIN", "IRON", "TIBC", "UIBC", "IRONPCTSAT" Lab Results  Component Value Date  RBC 3.95 (L) 10/17/2022   No results found for: "KPAFRELGTCHN", "LAMBDASER", "KAPLAMBRATIO" No results found for: "IGGSERUM", "IGA", "IGMSERUM" No results found for: "TOTALPROTELP", "ALBUMINELP", "A1GS", "A2GS", "BETS", "BETA2SER", "GAMS", "MSPIKE", "SPEI"   Chemistry      Component Value Date/Time   NA 142 10/17/2022 0753   NA 145 03/28/2022 0000   NA 147 (H) 04/13/2017 0936   K 4.1 10/17/2022 0753   K 4.4 04/13/2017 0936   CL 109 10/17/2022 0753   CL 108 04/13/2017 0936   CO2 26 10/17/2022 0753   CO2 33 04/13/2017 0936   BUN 23 10/17/2022 0753   BUN 14  03/28/2022 0000   BUN 13 04/13/2017 0936   CREATININE 1.34 (H) 10/17/2022 0753   CREATININE 1.5 (H) 04/13/2017 0936   GLU 73 03/28/2022 0000      Component Value Date/Time   CALCIUM 9.0 10/17/2022 0753   CALCIUM 10.0 04/13/2017 0936   ALKPHOS 43 10/17/2022 0753   ALKPHOS 51 04/13/2017 0936   AST 20 10/17/2022 0753   ALT 15 10/17/2022 0753   ALT 34 04/13/2017 0936   BILITOT 0.6 10/17/2022 0753       Impression and Plan: Randall Scott is a 72 yo caucasian gentleman heterozygous for the Factor V Leiden mutation.  He is doing well on 2 baby aspirin daily and will continue his same regimen.  He does not have any air travel planned this year.  So far, there has been no evidence of recurrence.  Follow-up in 6 months.   Eileen Stanford, NP 4/16/20248:51 AM

## 2022-11-07 DIAGNOSIS — R5383 Other fatigue: Secondary | ICD-10-CM | POA: Diagnosis not present

## 2022-11-07 DIAGNOSIS — Z79899 Other long term (current) drug therapy: Secondary | ICD-10-CM | POA: Diagnosis not present

## 2022-11-07 DIAGNOSIS — L4050 Arthropathic psoriasis, unspecified: Secondary | ICD-10-CM | POA: Diagnosis not present

## 2022-11-30 ENCOUNTER — Telehealth: Payer: Self-pay | Admitting: Family Medicine

## 2022-11-30 ENCOUNTER — Encounter: Payer: Self-pay | Admitting: Family Medicine

## 2022-11-30 ENCOUNTER — Other Ambulatory Visit: Payer: Self-pay | Admitting: Family Medicine

## 2022-11-30 MED ORDER — OMEPRAZOLE 40 MG PO CPDR: 40.0000 mg | DELAYED_RELEASE_CAPSULE | Freq: Every day | ORAL | 3 refills | Status: DC

## 2022-11-30 NOTE — Addendum Note (Signed)
Addended by: Abbe Amsterdam C on: 11/30/2022 12:17 PM   Modules accepted: Orders

## 2022-11-30 NOTE — Telephone Encounter (Signed)
Pt called because when he requested refill for Omeprazole mychart shows that the pharmacy listed is [facility] which the patient has never used and he lives locally so he would not have use of having prescriptions called there. He is requesting [place] to call prescription in to [facility] on [address]

## 2022-11-30 NOTE — Telephone Encounter (Signed)
Okay for refill? The Rx has "historical provider" next to it.

## 2022-12-26 DIAGNOSIS — H2513 Age-related nuclear cataract, bilateral: Secondary | ICD-10-CM | POA: Diagnosis not present

## 2023-01-02 DIAGNOSIS — Z79899 Other long term (current) drug therapy: Secondary | ICD-10-CM | POA: Diagnosis not present

## 2023-01-02 DIAGNOSIS — L4050 Arthropathic psoriasis, unspecified: Secondary | ICD-10-CM | POA: Diagnosis not present

## 2023-01-02 DIAGNOSIS — R5383 Other fatigue: Secondary | ICD-10-CM | POA: Diagnosis not present

## 2023-02-27 DIAGNOSIS — Z79899 Other long term (current) drug therapy: Secondary | ICD-10-CM | POA: Diagnosis not present

## 2023-02-27 DIAGNOSIS — L4050 Arthropathic psoriasis, unspecified: Secondary | ICD-10-CM | POA: Diagnosis not present

## 2023-02-27 DIAGNOSIS — R5383 Other fatigue: Secondary | ICD-10-CM | POA: Diagnosis not present

## 2023-02-27 DIAGNOSIS — Z111 Encounter for screening for respiratory tuberculosis: Secondary | ICD-10-CM | POA: Diagnosis not present

## 2023-04-03 DIAGNOSIS — L4050 Arthropathic psoriasis, unspecified: Secondary | ICD-10-CM | POA: Diagnosis not present

## 2023-04-03 DIAGNOSIS — Z6826 Body mass index (BMI) 26.0-26.9, adult: Secondary | ICD-10-CM | POA: Diagnosis not present

## 2023-04-03 DIAGNOSIS — Z79899 Other long term (current) drug therapy: Secondary | ICD-10-CM | POA: Diagnosis not present

## 2023-04-03 DIAGNOSIS — E663 Overweight: Secondary | ICD-10-CM | POA: Diagnosis not present

## 2023-04-03 DIAGNOSIS — Z1589 Genetic susceptibility to other disease: Secondary | ICD-10-CM | POA: Diagnosis not present

## 2023-04-03 DIAGNOSIS — L409 Psoriasis, unspecified: Secondary | ICD-10-CM | POA: Diagnosis not present

## 2023-04-18 ENCOUNTER — Inpatient Hospital Stay: Payer: Medicare PPO | Attending: Hematology & Oncology

## 2023-04-18 ENCOUNTER — Inpatient Hospital Stay: Payer: Medicare PPO | Admitting: Hematology & Oncology

## 2023-04-18 ENCOUNTER — Encounter: Payer: Self-pay | Admitting: Hematology & Oncology

## 2023-04-18 VITALS — BP 125/84 | HR 68 | Temp 97.7°F | Resp 20 | Ht 68.0 in | Wt 175.0 lb

## 2023-04-18 DIAGNOSIS — Z7982 Long term (current) use of aspirin: Secondary | ICD-10-CM | POA: Insufficient documentation

## 2023-04-18 DIAGNOSIS — Z8672 Personal history of thrombophlebitis: Secondary | ICD-10-CM | POA: Diagnosis not present

## 2023-04-18 DIAGNOSIS — I824Y1 Acute embolism and thrombosis of unspecified deep veins of right proximal lower extremity: Secondary | ICD-10-CM

## 2023-04-18 DIAGNOSIS — D6851 Activated protein C resistance: Secondary | ICD-10-CM | POA: Diagnosis not present

## 2023-04-18 LAB — CBC WITH DIFFERENTIAL (CANCER CENTER ONLY)
Abs Immature Granulocytes: 0.02 10*3/uL (ref 0.00–0.07)
Basophils Absolute: 0.1 10*3/uL (ref 0.0–0.1)
Basophils Relative: 1 %
Eosinophils Absolute: 0.2 10*3/uL (ref 0.0–0.5)
Eosinophils Relative: 3 %
HCT: 43.9 % (ref 39.0–52.0)
Hemoglobin: 15.1 g/dL (ref 13.0–17.0)
Immature Granulocytes: 0 %
Lymphocytes Relative: 33 %
Lymphs Abs: 1.9 10*3/uL (ref 0.7–4.0)
MCH: 35.1 pg — ABNORMAL HIGH (ref 26.0–34.0)
MCHC: 34.4 g/dL (ref 30.0–36.0)
MCV: 102.1 fL — ABNORMAL HIGH (ref 80.0–100.0)
Monocytes Absolute: 0.3 10*3/uL (ref 0.1–1.0)
Monocytes Relative: 6 %
Neutro Abs: 3.3 10*3/uL (ref 1.7–7.7)
Neutrophils Relative %: 57 %
Platelet Count: 197 10*3/uL (ref 150–400)
RBC: 4.3 MIL/uL (ref 4.22–5.81)
RDW: 13.2 % (ref 11.5–15.5)
WBC Count: 5.8 10*3/uL (ref 4.0–10.5)
nRBC: 0 % (ref 0.0–0.2)

## 2023-04-18 LAB — CMP (CANCER CENTER ONLY)
ALT: 25 U/L (ref 0–44)
AST: 30 U/L (ref 15–41)
Albumin: 4.4 g/dL (ref 3.5–5.0)
Alkaline Phosphatase: 48 U/L (ref 38–126)
Anion gap: 11 (ref 5–15)
BUN: 19 mg/dL (ref 8–23)
CO2: 26 mmol/L (ref 22–32)
Calcium: 9.8 mg/dL (ref 8.9–10.3)
Chloride: 106 mmol/L (ref 98–111)
Creatinine: 1.36 mg/dL — ABNORMAL HIGH (ref 0.61–1.24)
GFR, Estimated: 55 mL/min — ABNORMAL LOW (ref >60)
Glucose, Bld: 102 mg/dL — ABNORMAL HIGH (ref 70–99)
Potassium: 4.9 mmol/L (ref 3.5–5.1)
Sodium: 143 mmol/L (ref 135–145)
Total Bilirubin: 0.8 mg/dL (ref 0.3–1.2)
Total Protein: 7 g/dL (ref 6.5–8.1)

## 2023-04-18 LAB — D-DIMER, QUANTITATIVE: D-Dimer, Quant: 0.39 ug{FEU}/mL (ref 0.00–0.50)

## 2023-04-18 NOTE — Progress Notes (Signed)
Hematology and Oncology Follow Up Visit  Randall Scott [id number] 06-23-1951 72 y.o. 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 daily-maintenance therapy for 1 year-complete in August 2020 EC ASA 162 mg po q day -- start on 09/16/2021 Xarelto 20 mg p.o. daily-6 weeks to finish up on 08/22/2021      Interim History:  Randall Scott is back for follow-up.  We saw 6 months ago.  His main complaint is that he is not hungry.  He is lost 3 pounds in 6 months.  He has had no nausea or vomiting.  He says he just does not have much of an appetite.  I think he sees his family doctor next week.  Of note, last year I think he had upper and lower endoscopy which was relatively unrevealing.  I wonder if some of this might be from the methotrexate that he is taking.  He is taking Prilosec.  He has had no bleeding.  He is on aspirin.  He is taking baby aspirin.  The 1 thought is that the baby aspirin may be contributing to a little bit of gastritis.  Again, he may need to have another upper endoscopy since he just had one last year.  He has had no leg pain or swelling.  He has had no diarrhea.  He has had no fever.  There is been no problems with COVID.  He has not no cough or shortness of breath.  Overall, I would say that his performance status is probably ECOG 1.    Medications:  Current Outpatient Medications:    aspirin 81 MG EC tablet, Take 162 mg by mouth daily., Disp: , Rfl:    folic acid (FOLVITE) 400 MCG tablet, Take 1,600 mcg by mouth daily., Disp: , Rfl:    golimumab (SIMPONI ARIA) 50 MG/4ML SOLN injection, every 8 (eight) weeks., Disp: , Rfl:    methotrexate 2.5 MG tablet, Take by mouth once a week., Disp: , Rfl:    omeprazole (PRILOSEC)  40 MG capsule, Take 1 capsule (40 mg total) by mouth daily., Disp: 90 capsule, Rfl: 3  Allergies: No Known Allergies  Past Medical History, Surgical history, Social history, and Family History were reviewed and updated.  Review of Systems: Review of Systems  Constitutional:  Negative for appetite change, fatigue, fever and unexpected weight change.  HENT:   Negative for lump/mass, mouth sores, sore throat and trouble swallowing.   Respiratory:  Negative for cough, hemoptysis and shortness of breath.   Cardiovascular:  Negative for leg swelling and palpitations.  Gastrointestinal:  Negative for abdominal distention, abdominal pain, blood in stool, constipation, diarrhea, nausea and vomiting.  Genitourinary:  Negative for bladder incontinence, dysuria, frequency and hematuria.   Musculoskeletal:  Negative for arthralgias, back pain, gait problem and myalgias.  Skin:  Negative for itching and rash.  Neurological:  Negative for dizziness, extremity weakness, gait problem, headaches, numbness, seizures and speech difficulty.  Hematological:  Does not bruise/bleed easily.  Psychiatric/Behavioral:  Negative for depression and sleep disturbance. The patient is not nervous/anxious.     Physical Exam:  height is 5\' 8"  (1.727 m) and weight is 175 lb (79.4 kg). His oral temperature is 97.7 F (36.5 C). His blood pressure is 125/84 and his pulse is  68. His respiration is 20 and oxygen saturation is 99%.   Wt Readings from Last 3 Encounters:  04/18/23 175 lb (79.4 kg)  10/17/22 178 lb (80.7 kg)  04/19/22 173 lb 12.8 oz (78.8 kg)    Physical Exam Vitals reviewed.  HENT:     Head: Normocephalic and atraumatic.  Eyes:     Pupils: Pupils are equal, round, and reactive to light.  Cardiovascular:     Rate and Rhythm: Normal rate and regular rhythm.     Heart sounds: Normal heart sounds.  Pulmonary:     Effort: Pulmonary effort is normal.     Breath sounds: Normal breath sounds.  Abdominal:      General: Bowel sounds are normal.     Palpations: Abdomen is soft.  Musculoskeletal:        General: No tenderness or deformity. Normal range of motion.     Cervical back: Normal range of motion.  Lymphadenopathy:     Cervical: No cervical adenopathy.  Skin:    General: Skin is warm and dry.     Findings: No erythema or rash.  Neurological:     Mental Status: He is alert and oriented to person, place, and time.  Psychiatric:        Behavior: Behavior normal.        Thought Content: Thought content normal.        Judgment: Judgment normal.      Lab Results  Component Value Date   WBC 5.8 04/18/2023   HGB 15.1 04/18/2023   HCT 43.9 04/18/2023   MCV 102.1 (H) 04/18/2023   PLT 197 04/18/2023     Chemistry      Component Value Date/Time   NA 142 10/17/2022 0753   NA 145 03/28/2022 0000   NA 147 (H) 04/13/2017 0936   K 4.1 10/17/2022 0753   K 4.4 04/13/2017 0936   CL 109 10/17/2022 0753   CL 108 04/13/2017 0936   CO2 26 10/17/2022 0753   CO2 33 04/13/2017 0936   BUN 23 10/17/2022 0753   BUN 14 03/28/2022 0000   BUN 13 04/13/2017 0936   CREATININE 1.34 (H) 10/17/2022 0753   CREATININE 1.5 (H) 04/13/2017 0936   GLU 73 03/28/2022 0000      Component Value Date/Time   CALCIUM 9.0 10/17/2022 0753   CALCIUM 10.0 04/13/2017 0936   ALKPHOS 43 10/17/2022 0753   ALKPHOS 51 04/13/2017 0936   AST 20 10/17/2022 0753   ALT 15 10/17/2022 0753   ALT 34 04/13/2017 0936   BILITOT 0.6 10/17/2022 0753      Impression and Plan: Randall Scott is a 72 year old male. He has a Factor V Leiden mutation. He is heterozygous for this.  He does have the problems with the intestinal system.  Again, he sees his family doctor next week.  As far as hematologic issues go, I do not see any problems with thromboembolism.  I would not think he would have any type of intestinal thromboembolic phenomenon that is causing the appetite issues.  We will still plan to get him back in 6 months.  I think  this is very reasonable.   Randall Macho, MD 10/16/20248:41 AM

## 2023-04-21 NOTE — Patient Instructions (Signed)
It was very nice to see you again today!    If not done already, recommend seasonal flu shot, COVID booster, tetanus booster- tetanus and flu given today   Please let me know if you notice persistent weight loss or other concerns I ordered your CT coronary- if you like, stop by imaging on the way out today and they can set this up for you  You can try taking the prilosec every other day and see how you do?

## 2023-04-21 NOTE — Progress Notes (Unsigned)
[facility] [address] [place], [place] [address] [phone]  Date:  04/23/2023   Name:  Randall Scott   DOB:  Nov 13, 1950   MRN:  [id number]  PCP:  Pearline Cables, MD    Chief Complaint: No chief complaint on file.   History of Present Illness:  Randall Scott is a 72 y.o. very pleasant male patient who presents with the following:  Patient seen today for physical exam Most recent visit with myself was about 1 year ago Pt with history of Factor 5 leiden and DVT in 2018- he was treated with xarelto in the past but now just on baby aspirin  He took xarelto for about one year per his recollection Also history of prediabetes, psoriasis/ psoriatic arthritis  His psoriatic arthritis is managed by Dr. 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 daily-maintenance therapy for 1 year-complete in August 2020 EC ASA 162 mg po q day -- start on 09/16/2021 Xarelto 20 mg p.o. daily-6 weeks to finish up on 08/22/2021  Per Dr. 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   Headache 08/21/2013   Lateral epicondylitis of right elbow 04/01/2013    Past Medical History:  Diagnosis Date   Allergy    Arthritis    Asthma    Cataract    GERD (gastroesophageal reflux disease)     Past Surgical History:  Procedure Laterality Date   COLONOSCOPY  08/30/2021   EYE SURGERY     HERNIA REPAIR     PERIPHERAL VASCULAR THROMBECTOMY Right    RLE   UPPER GASTROINTESTINAL ENDOSCOPY  08/30/2021    Social History   Tobacco Use   Smoking status: Never   Smokeless tobacco: Never  Vaping Use   Vaping status: Never Used  Substance Use Topics   Alcohol use: No   Drug use: No    Family History  Problem Relation Age of Onset   Lung cancer Mother    Lung cancer Father    Sudden death Neg Hx    Hypertension Neg Hx    Hyperlipidemia Neg Hx    Heart attack Neg Hx    Diabetes Neg Hx    Colon cancer Neg Hx    Rectal cancer Neg Hx    Esophageal cancer Neg Hx    Pancreatic cancer Neg Hx    Stomach cancer Neg Hx     No Known Allergies  Medication list has been reviewed and updated.  Current Outpatient Medications on File Prior to Visit  Medication Sig Dispense Refill  aspirin 81 MG EC tablet Take 162 mg by mouth daily.     folic acid (FOLVITE) 400 MCG tablet Take 1,600 mcg by mouth daily.     golimumab (SIMPONI ARIA) 50 MG/4ML SOLN injection every 8 (eight) weeks.     methotrexate 2.5 MG tablet Take by mouth once a week.     omeprazole (PRILOSEC) 40 MG capsule Take 1 capsule (40 mg total) by mouth daily. 90 capsule 3   No current facility-administered medications on file prior to visit.    Review of Systems:  As per HPI- otherwise negative.   Physical Examination: There were no vitals filed for this visit. There were no vitals filed for this visit. There is no height or weight on file to calculate BMI. Ideal Body Weight:    GEN: no acute distress. HEENT: Atraumatic, Normocephalic.  Ears and Nose: No external  deformity. CV: RRR, No M/G/R. No JVD. No thrill. No extra heart sounds. PULM: CTA B, no wheezes, crackles, rhonchi. No retractions. No resp. distress. No accessory muscle use. ABD: S, NT, ND, +BS. No rebound. No HSM. EXTR: No c/c/e PSYCH: Normally interactive. Conversant.    Assessment and Plan: *** Physical exam today.  Encouraged healthy diet and exercise routine Will plan further follow- up pending labs.  Signed Abbe Amsterdam, MD

## 2023-04-23 ENCOUNTER — Ambulatory Visit (INDEPENDENT_AMBULATORY_CARE_PROVIDER_SITE_OTHER): Payer: Medicare PPO | Admitting: Family Medicine

## 2023-04-23 ENCOUNTER — Encounter: Payer: Self-pay | Admitting: Family Medicine

## 2023-04-23 VITALS — BP 122/80 | HR 68 | Temp 97.8°F | Resp 18 | Ht 68.0 in | Wt 179.8 lb

## 2023-04-23 DIAGNOSIS — Z0000 Encounter for general adult medical examination without abnormal findings: Secondary | ICD-10-CM | POA: Diagnosis not present

## 2023-04-23 DIAGNOSIS — Z23 Encounter for immunization: Secondary | ICD-10-CM | POA: Diagnosis not present

## 2023-04-23 DIAGNOSIS — Z131 Encounter for screening for diabetes mellitus: Secondary | ICD-10-CM

## 2023-04-23 DIAGNOSIS — R140 Abdominal distension (gaseous): Secondary | ICD-10-CM

## 2023-04-23 DIAGNOSIS — Z13220 Encounter for screening for lipoid disorders: Secondary | ICD-10-CM

## 2023-04-23 DIAGNOSIS — Z125 Encounter for screening for malignant neoplasm of prostate: Secondary | ICD-10-CM

## 2023-04-23 DIAGNOSIS — S81802A Unspecified open wound, left lower leg, initial encounter: Secondary | ICD-10-CM | POA: Diagnosis not present

## 2023-04-23 LAB — LIPID PANEL
Cholesterol: 148 mg/dL (ref 0–200)
HDL: 47 mg/dL
LDL Cholesterol: 85 mg/dL (ref 0–99)
NonHDL: 101.29
Total CHOL/HDL Ratio: 3
Triglycerides: 82 mg/dL (ref 0.0–149.0)
VLDL: 16.4 mg/dL (ref 0.0–40.0)

## 2023-04-23 LAB — HEMOGLOBIN A1C: Hgb A1c MFr Bld: 5.7 % (ref 4.6–6.5)

## 2023-04-23 LAB — PSA: PSA: 2.17 ng/mL (ref 0.10–4.00)

## 2023-04-23 LAB — LIPASE: Lipase: 14 U/L (ref 11.0–59.0)

## 2023-04-25 DIAGNOSIS — L4050 Arthropathic psoriasis, unspecified: Secondary | ICD-10-CM | POA: Diagnosis not present

## 2023-04-25 DIAGNOSIS — Z79899 Other long term (current) drug therapy: Secondary | ICD-10-CM | POA: Diagnosis not present

## 2023-04-26 ENCOUNTER — Encounter: Payer: Self-pay | Admitting: Family Medicine

## 2023-04-26 ENCOUNTER — Ambulatory Visit (HOSPITAL_BASED_OUTPATIENT_CLINIC_OR_DEPARTMENT_OTHER)
Admission: RE | Admit: 2023-04-26 | Discharge: 2023-04-26 | Disposition: A | Discharge status: Home / Self Care | Payer: Medicare PPO | Source: Ambulatory Visit | Attending: Family Medicine | Admitting: Family Medicine

## 2023-04-26 DIAGNOSIS — Z131 Encounter for screening for diabetes mellitus: Secondary | ICD-10-CM

## 2023-06-20 DIAGNOSIS — Z79899 Other long term (current) drug therapy: Secondary | ICD-10-CM | POA: Diagnosis not present

## 2023-06-20 DIAGNOSIS — R5383 Other fatigue: Secondary | ICD-10-CM | POA: Diagnosis not present

## 2023-06-20 DIAGNOSIS — L4050 Arthropathic psoriasis, unspecified: Secondary | ICD-10-CM | POA: Diagnosis not present

## 2023-07-16 DIAGNOSIS — L821 Other seborrheic keratosis: Secondary | ICD-10-CM | POA: Diagnosis not present

## 2023-07-16 DIAGNOSIS — L570 Actinic keratosis: Secondary | ICD-10-CM | POA: Diagnosis not present

## 2023-07-16 DIAGNOSIS — D485 Neoplasm of uncertain behavior of skin: Secondary | ICD-10-CM | POA: Diagnosis not present

## 2023-07-16 DIAGNOSIS — L814 Other melanin hyperpigmentation: Secondary | ICD-10-CM | POA: Diagnosis not present

## 2023-07-18 DIAGNOSIS — R7989 Other specified abnormal findings of blood chemistry: Secondary | ICD-10-CM | POA: Diagnosis not present

## 2023-08-09 ENCOUNTER — Telehealth: Payer: Self-pay | Admitting: Family Medicine

## 2023-08-09 NOTE — Telephone Encounter (Signed)
 Copied from CRM [id number]. Topic: Medicare AWV >> Aug 09, 2023  9:08 AM Nathanel DEL wrote: Reason for CRM: Called LVM 08/08/2023 to schedule AWV. Please schedule Virtual or Telehealth visits ONLY.   Nathanel Paschal; Care Guide Ambulatory Clinical Support [place] [facility] Direct Dial: [phone]

## 2023-08-15 DIAGNOSIS — R5383 Other fatigue: Secondary | ICD-10-CM | POA: Diagnosis not present

## 2023-08-15 DIAGNOSIS — L4050 Arthropathic psoriasis, unspecified: Secondary | ICD-10-CM | POA: Diagnosis not present

## 2023-08-15 DIAGNOSIS — R7989 Other specified abnormal findings of blood chemistry: Secondary | ICD-10-CM | POA: Diagnosis not present

## 2023-08-15 DIAGNOSIS — Z79899 Other long term (current) drug therapy: Secondary | ICD-10-CM | POA: Diagnosis not present

## 2023-10-02 DIAGNOSIS — L4050 Arthropathic psoriasis, unspecified: Secondary | ICD-10-CM | POA: Diagnosis not present

## 2023-10-02 DIAGNOSIS — E663 Overweight: Secondary | ICD-10-CM | POA: Diagnosis not present

## 2023-10-02 DIAGNOSIS — Z1589 Genetic susceptibility to other disease: Secondary | ICD-10-CM | POA: Diagnosis not present

## 2023-10-02 DIAGNOSIS — Z79899 Other long term (current) drug therapy: Secondary | ICD-10-CM | POA: Diagnosis not present

## 2023-10-02 DIAGNOSIS — L409 Psoriasis, unspecified: Secondary | ICD-10-CM | POA: Diagnosis not present

## 2023-10-02 DIAGNOSIS — Z6827 Body mass index (BMI) 27.0-27.9, adult: Secondary | ICD-10-CM | POA: Diagnosis not present

## 2023-10-10 DIAGNOSIS — L4050 Arthropathic psoriasis, unspecified: Secondary | ICD-10-CM | POA: Diagnosis not present

## 2023-10-10 DIAGNOSIS — R7989 Other specified abnormal findings of blood chemistry: Secondary | ICD-10-CM | POA: Diagnosis not present

## 2023-10-10 DIAGNOSIS — R5383 Other fatigue: Secondary | ICD-10-CM | POA: Diagnosis not present

## 2023-10-10 DIAGNOSIS — Z79899 Other long term (current) drug therapy: Secondary | ICD-10-CM | POA: Diagnosis not present

## 2023-11-20 ENCOUNTER — Ambulatory Visit: Admitting: Physician Assistant

## 2023-11-20 ENCOUNTER — Encounter: Payer: Self-pay | Admitting: Physician Assistant

## 2023-11-20 VITALS — BP 120/80 | HR 73 | Temp 97.6°F | Ht 68.0 in | Wt 178.0 lb

## 2023-11-20 DIAGNOSIS — J0100 Acute maxillary sinusitis, unspecified: Secondary | ICD-10-CM | POA: Diagnosis not present

## 2023-11-20 MED ORDER — AMOXICILLIN 250 MG/5ML PO SUSR: 875.0000 mg | Freq: Two times a day (BID) | ORAL | 0 refills | Status: AC

## 2023-11-20 NOTE — Progress Notes (Signed)
      Established patient visit   Patient: Randall Scott   DOB: [birth year]   73 y.o. Male  MRN: [id number] Visit Date: 11/20/2023  Today's healthcare provider: Trenton Frock, PA-C   Chief Complaint  Patient presents with   Cough    Drainage in the back of throat making it scratchy; believe sinus infection; feeling pressure in head; x10 days   Subjective     Pt reports nasal congestion, sinus pressure, ear pressure 'barrel head' sensation, sore throat, PND x 10 days. Denies fever. Taking a nasal spray and sudafed otc.  Medications: Outpatient Medications Prior to Visit  Medication Sig   aspirin 81 MG EC tablet Take 162 mg by mouth daily.   folic acid  (FOLVITE ) 400 MCG tablet Take 1,600 mcg by mouth daily.   golimumab  (SIMPONI  ARIA) 50 MG/4ML SOLN injection every 8 (eight) weeks.   methotrexate 2.5 MG tablet Take by mouth once a week.   omeprazole  (PRILOSEC) 40 MG capsule Take 1 capsule (40 mg total) by mouth daily.   No facility-administered medications prior to visit.    Review of Systems  Constitutional:  Positive for fatigue. Negative for fever.  HENT:  Positive for congestion, hearing loss, postnasal drip, sinus pressure, sinus pain and sore throat.   Respiratory:  Negative for cough and shortness of breath.   Cardiovascular:  Negative for chest pain, palpitations and leg swelling.  Neurological:  Positive for headaches. Negative for dizziness.       Objective    BP 120/80   Pulse 73   Temp 97.6 F (36.4 C)   Ht 5\' 8"  (1.727 m)   Wt 178 lb (80.7 kg)   SpO2 98%   BMI 27.06 kg/m    Physical Exam Constitutional:      General: He is awake.     Appearance: He is well-developed.  HENT:     Head: Normocephalic.     Right Ear: Tympanic membrane normal.     Left Ear: There is impacted cerumen.  Eyes:     Conjunctiva/sclera: Conjunctivae normal.  Cardiovascular:     Rate and Rhythm: Normal rate and regular rhythm.     Heart sounds: Normal heart sounds.   Pulmonary:     Effort: Pulmonary effort is normal.     Breath sounds: Normal breath sounds.  Skin:    General: Skin is warm.  Neurological:     Mental Status: He is alert and oriented to person, place, and time.  Psychiatric:        Attention and Perception: Attention normal.        Mood and Affect: Mood normal.        Speech: Speech normal.        Behavior: Behavior is cooperative.      No results found for any visits on 11/20/23.  Assessment & Plan    Acute non-recurrent maxillary sinusitis -     Amoxicillin ; Take 17.5 mLs (875 mg total) by mouth 2 (two) times daily for 7 days.  Dispense: 300 mL; Refill: 0  Rx amox bid x 7 days, pt cannot swallow pills, rx suspension . Recommending otc antihistamines, cont nasal spray, hydration.recommend d/c sudafed after a few days.   Return if symptoms worsen or fail to improve.       Trenton Frock, PA-C  [facility] Primary [facility] at [facility] [phone] (phone) [phone] (fax)  [facility]

## 2023-12-01 ENCOUNTER — Other Ambulatory Visit: Payer: Self-pay | Admitting: Family Medicine

## 2023-12-05 DIAGNOSIS — R5383 Other fatigue: Secondary | ICD-10-CM | POA: Diagnosis not present

## 2023-12-05 DIAGNOSIS — Z79899 Other long term (current) drug therapy: Secondary | ICD-10-CM | POA: Diagnosis not present

## 2023-12-05 DIAGNOSIS — L4050 Arthropathic psoriasis, unspecified: Secondary | ICD-10-CM | POA: Diagnosis not present

## 2023-12-05 LAB — COMPREHENSIVE METABOLIC PANEL WITH GFR
Calcium: 9.6 (ref 8.7–10.7)
eGFR: 46

## 2023-12-05 LAB — HEPATIC FUNCTION PANEL
ALT: 10 U/L (ref 10–40)
AST: 21 (ref 14–40)
Alkaline Phosphatase: 70 (ref 25–125)
Bilirubin, Total: 0.5

## 2023-12-05 LAB — BASIC METABOLIC PANEL WITH GFR
BUN: 16 (ref 4–21)
CO2: 19 (ref 13–22)
Chloride: 106 (ref 99–108)
Creatinine: 1.6 — AB (ref 0.6–1.3)
Glucose: 79
Potassium: 4.7 meq/L (ref 3.5–5.1)
Sodium: 142 (ref 137–147)

## 2023-12-05 LAB — CBC AND DIFFERENTIAL
EGFR: 46
HCT: 45 (ref 41–53)
Hemoglobin: 14.6 (ref 13.5–17.5)
Platelets: 220 10*3/uL (ref 150–400)
WBC: 7.1

## 2023-12-07 ENCOUNTER — Ambulatory Visit: Admitting: *Deleted

## 2023-12-07 VITALS — Ht 68.0 in | Wt 178.0 lb

## 2023-12-07 DIAGNOSIS — Z0000 Encounter for general adult medical examination without abnormal findings: Secondary | ICD-10-CM | POA: Diagnosis not present

## 2023-12-07 NOTE — Progress Notes (Addendum)
 Please attest this visit in the absence of patient primary care provider.    Subjective:   Randall Scott is a 73 y.o. who presents for a Medicare Wellness preventive visit.  As a reminder, Annual Wellness Visits don't include a physical exam, and some assessments may be limited, especially if this visit is performed virtually. We may recommend an in-person follow-up visit with your provider if needed.  Visit Complete: Virtual I connected with  Randall Scott on 12/07/23 by a audio enabled telemedicine application and verified that I am speaking with the correct person using two identifiers.  Patient Location: Home  Provider Location: Office/Clinic  I discussed the limitations of evaluation and management by telemedicine. The patient expressed understanding and agreed to proceed.  Vital Signs: Because this visit was a virtual/telehealth visit, some criteria may be missing or patient reported. Any vitals not documented were not able to be obtained and vitals that have been documented are patient reported.  VideoDeclined- This patient declined Librarian, academic. Therefore the visit was completed with audio only.  Persons Participating in Visit: Patient.  AWV Questionnaire: Yes: Patient Medicare AWV questionnaire was completed by the patient on 12/02/23; I have confirmed that all information answered by patient is correct and no changes since this date.  Cardiac Risk Factors include: male gender;advanced age (>72men, >61 women)     Objective:     Today's Vitals   12/07/23 0817  Weight: 178 lb (80.7 kg)  Height: 5\' 8"  (1.727 m)   Body mass index is 27.06 kg/m.     12/07/2023    8:33 AM 04/18/2023    8:16 AM 10/17/2022    8:26 AM 08/21/2022    8:22 AM 04/18/2022    8:24 AM 11/10/2021    9:42 AM 08/31/2021   10:04 AM  Advanced Directives  Does Patient Have a Medical Advance Directive? Yes Yes Yes Yes Yes Yes Yes  Type of Estate agent  of [place];Living will Living will;Healthcare Power of Attorney Living will;Healthcare Power of [employer] Power of [place];Living will Healthcare Power of [place];Living will Living will;Healthcare Power of [employer] Power of Attorney  Does patient want to make changes to medical advance directive? No - Patient declined   No - Patient declined No - Patient declined No - Patient declined   Copy of Healthcare Power of Attorney in Chart? Yes - validated most recent copy scanned in chart (See row information)   Yes - validated most recent copy scanned in chart (See row information) Yes - validated most recent copy scanned in chart (See row information)  Yes - validated most recent copy scanned in chart (See row information)    Current Medications (verified) Outpatient Encounter Medications as of 12/07/2023  Medication Sig   aspirin 81 MG EC tablet Take 162 mg by mouth daily.   folic acid  (FOLVITE ) 400 MCG tablet Take 1,600 mcg by mouth daily.   golimumab  (SIMPONI  ARIA) 50 MG/4ML SOLN injection every 8 (eight) weeks.   methotrexate 2.5 MG tablet Take by mouth once a week.   omeprazole  (PRILOSEC) 40 MG capsule Take 1 capsule by mouth once daily   No facility-administered encounter medications on file as of 12/07/2023.    Allergies (verified) Patient has no known allergies.   History: Past Medical History:  Diagnosis Date   Allergy    Arthritis    Asthma    Cataract    GERD (gastroesophageal reflux disease)    Past Surgical  History:  Procedure Laterality Date   COLONOSCOPY  08/30/2021   EYE SURGERY     HERNIA REPAIR     PERIPHERAL VASCULAR THROMBECTOMY Right    RLE   UPPER GASTROINTESTINAL ENDOSCOPY  08/30/2021   Family History  Problem Relation Age of Onset   Lung cancer Mother    Lung cancer Father    Sudden death Neg Hx    Hypertension Neg Hx    Hyperlipidemia Neg Hx    Heart attack Neg Hx    Diabetes Neg Hx    Colon cancer Neg Hx    Rectal cancer Neg Hx     Esophageal cancer Neg Hx    Pancreatic cancer Neg Hx    Stomach cancer Neg Hx    Social History   Socioeconomic History   Marital status: Married    Spouse name: Adah Acron   Number of children: 2   Years of education: 12   Highest education level: Not on file  Occupational History   Occupation: Chiropodist: [employer]   Occupation: retired  Tobacco Use   Smoking status: Never   Smokeless tobacco: Never  Vaping Use   Vaping status: Never Used  Substance and Sexual Activity   Alcohol use: No   Drug use: No   Sexual activity: Yes    Birth control/protection: None    Comment: married  Other Topics Concern   Not on file  Social History Narrative   Lives with his wife.  Daughters are grown and live nearby.   Patient is right handed   Education level is 12   Caffeine consumption is 2-3 cups daily   Social Drivers of Health   Financial Resource Strain: Low Risk  (12/07/2023)   Overall Financial Resource Strain (CARDIA)    Difficulty of Paying Living Expenses: Not very hard  Food Insecurity: No Food Insecurity (12/07/2023)   Hunger Vital Sign    Worried About Running Out of Food in the Last Year: Never true    Ran Out of Food in the Last Year: Never true  Transportation Needs: No Transportation Needs (12/07/2023)   PRAPARE - Administrator, Civil Service (Medical): No    Lack of Transportation (Non-Medical): No  Physical Activity: Sufficiently Active (12/07/2023)   Exercise Vital Sign    Days of Exercise per Week: 3 days    Minutes of Exercise per Session: 60 min  Stress: No Stress Concern Present (12/07/2023)   [employer] of Occupational Health - Occupational Stress Questionnaire    Feeling of Stress : Not at all  Social Connections: Moderately Isolated (12/07/2023)   Social Connection and Isolation Panel [NHANES]    Frequency of Communication with Friends and Family: Once a week    Frequency of Social Gatherings with Friends and  Family: Once a week    Attends Religious Services: More than 4 times per year    Active Member of [employer] or Organizations: No    Attends Engineer, structural: Never    Marital Status: Married    Tobacco Counseling Counseling given: Not Answered    Clinical Intake:  Pre-visit preparation completed: Yes  Pain : No/denies pain     BMI - recorded: 27.06 Nutritional Status: BMI 25 -29 Overweight Nutritional Risks: None Diabetes: No  Lab Results  Component Value Date   HGBA1C 5.7 04/23/2023   HGBA1C 5.8 04/19/2022   HGBA1C 5.6 04/25/2021     How often do you need to have  someone help you when you read instructions, pamphlets, or other written materials from your doctor or pharmacy?: 1 - Never What is the last grade level you completed in school?: 12  Interpreter Needed?: No  Information entered by :: Susa Engman, CMA   Activities of Daily Living     12/02/2023    8:44 AM  In your present state of health, do you have any difficulty performing the following activities:  Hearing? 1  Comment has bilateral hearing aids  Vision? 0  Difficulty concentrating or making decisions? 0  Walking or climbing stairs? 0  Dressing or bathing? 0  Doing errands, shopping? 0  Preparing Food and eating ? N  Using the Toilet? N  In the past six months, have you accidently leaked urine? N  Do you have problems with loss of bowel control? N  Managing your Medications? N  Managing your Finances? N  Housekeeping or managing your Housekeeping? N    Patient Care Team: Copland, Skipper Dumas, MD as PCP - General (Family Medicine) Karolynn Pack, MD (Family Medicine) Maria Shiner Sherryll Donald, MD as Consulting Physician (Oncology) Clark-Burning, Bridgette Campus, PA-C (Inactive) (Dermatology)  I have updated your Care Teams any recent Medical Services you may have received from other providers in the past year.     Assessment:    This is a routine wellness examination for  Randall Scott.  Hearing/Vision screen Hearing Screening - Comments:: Has bilateral hearing aids Vision Screening - Comments:: Last exam 03/2023? With [facility]   Goals Addressed   None    Depression Screen     12/07/2023    8:32 AM 04/23/2023    8:57 AM 08/21/2022    8:25 AM 04/19/2022    9:25 AM 08/19/2021    1:09 PM 07/12/2021    2:37 PM 04/25/2021   10:17 AM  PHQ 2/9 Scores  PHQ - 2 Score 0 0 0 0 0 0 0    Fall Risk     12/02/2023    8:44 AM 04/23/2023    8:57 AM 08/21/2022    8:22 AM 04/19/2022    9:25 AM 08/19/2021    1:09 PM  Fall Risk   Falls in the past year? 1 0 0 1 0  Number falls in past yr: 0 0 0 0 0  Injury with Fall? 0 0 0 1 0  Risk for fall due to : History of fall(s) No Fall Risks No Fall Risks  No Fall Risks  Follow up Education provided Falls evaluation completed Falls evaluation completed Falls evaluation completed Falls evaluation completed    MEDICARE RISK AT HOME:  Medicare Risk at Home Any stairs in or around the home?: (Patient-Rptd) No If so, are there any without handrails?: (Patient-Rptd) No Home free of loose throw rugs in walkways, pet beds, electrical cords, etc?: (Patient-Rptd) Yes Adequate lighting in your home to reduce risk of falls?: (Patient-Rptd) Yes Life alert?: (Patient-Rptd) No Use of a cane, walker or w/c?: (Patient-Rptd) No Grab bars in the bathroom?: (Patient-Rptd) No Shower chair or bench in shower?: (Patient-Rptd) No Elevated toilet seat or a handicapped toilet?: (Patient-Rptd) No  TIMED UP AND GO:  Was the test performed?  No, audio  Cognitive Function: 6CIT completed    04/09/2017   10:09 AM  MMSE - Mini Mental State Exam  Orientation to time 5  Orientation to Place 5  Registration 3  Attention/ Calculation 5  Recall 3  Language- name 2 objects 2  Language- repeat 1  Language- follow 3 step command 3  Language- read & follow direction 1  Write a sentence 1  Copy design 1  Total score 30         12/07/2023    8:33 AM 08/21/2022    8:30 AM 07/12/2021    2:43 PM  6CIT Screen  What Year? 0 points 0 points 0 points  What month? 0 points 0 points 0 points  What time? 0 points 0 points 0 points  Count back from 20 0 points 0 points 0 points  Months in reverse 0 points 0 points 0 points  Repeat phrase 0 points 0 points 0 points  Total Score 0 points 0 points 0 points    Immunizations Immunization History  Administered Date(s) Administered   Fluad Quad(high Dose 65+) 03/16/2019, 04/22/2020, 04/25/2021, 04/19/2022   Fluad Trivalent(High Dose 65+) 04/23/2023   Influenza Split 07/04/2006   Influenza, High Dose Seasonal PF 04/06/2016, 04/09/2017, 04/11/2018   Influenza,inj,Quad PF,6+ Mos 03/25/2013, 04/21/2014, 05/10/2015   PFIZER(Purple Top)SARS-COV-2 Vaccination 08/05/2019, 08/23/2019, 03/03/2020   Pneumococcal Conjugate-13 04/06/2016   Pneumococcal Polysaccharide-23 04/09/2017   Td 04/23/2023   Tdap 12/26/2012   Zoster Recombinant(Shingrix) 04/25/2018, 07/01/2018    Screening Tests Health Maintenance  Topic Date Due   Medicare Annual Wellness (AWV)  08/22/2023   COVID-19 Vaccine (4 - 2024-25 season) 12/06/2024 (Originally 03/04/2023)   INFLUENZA VACCINE  02/01/2024   Colonoscopy  08/31/2031   DTaP/Tdap/Td (3 - Td or Tdap) 04/22/2033   Pneumonia Vaccine 37+ Years old  Completed   Hepatitis C Screening  Completed   Zoster Vaccines- Shingrix  Completed   HPV VACCINES  Aged Out   Meningococcal B Vaccine  Aged Out    Health Maintenance  Health Maintenance Due  Topic Date Due   Medicare Annual Wellness (AWV)  08/22/2023   Health Maintenance Items Addressed: Patient declines further COVID vaccines at this time. All other HM up to date.  Additional Screening:  Vision Screening: Recommended annual ophthalmology exams for early detection of glaucoma and other disorders of the eye. Would you like a referral to an eye doctor? No    Dental Screening: Recommended annual dental  exams for proper oral hygiene  Community Resource Referral / Chronic Care Management: CRR required this visit?  No   CCM required this visit?  No   Plan:    I have personally reviewed and noted the following in the patient's chart:   Medical and social history Use of alcohol, tobacco or illicit drugs  Current medications and supplements including opioid prescriptions. Patient is not currently taking opioid prescriptions. Functional ability and status Nutritional status Physical activity Advanced directives List of other physicians Hospitalizations, surgeries, and ER visits in previous 12 months Vitals Screenings to include cognitive, depression, and falls Referrals and appointments  In addition, I have reviewed and discussed with patient certain preventive protocols, quality metrics, and best practice recommendations. A written personalized care plan for preventive services as well as general preventive health recommendations were provided to patient.   Susa Engman, CMA   12/07/2023   After Visit Summary: (MyChart) Due to this being a telephonic visit, the after visit summary with patients personalized plan was offered to patient via MyChart   Notes: Nothing significant to report at this time.

## 2023-12-07 NOTE — Patient Instructions (Signed)
 Randall Scott , Thank you for taking time out of your busy schedule to complete your Annual Wellness Visit with me. I enjoyed our conversation and look forward to speaking with you again next year. I, as well as your care team,  appreciate your ongoing commitment to your health goals. Please review the following plan we discussed and let me know if I can assist you in the future.  Your Game plan/ To Do List     Follow up Visits: Next Medicare AWV with our clinical staff: 12/09/24 8:20am   Next Office Visit with your provider: 04/23/24 8:20am  Clinician Recommendations:  Aim for 30 minutes of exercise or brisk walking, 6-8 glasses of water, and 5 servings of fruits and vegetables each day.       This is a list of the screening recommended for you and due dates:  Health Maintenance  Topic Date Due   COVID-19 Vaccine (4 - 2024-25 season) 12/06/2024*   Flu Shot  02/01/2024   Medicare Annual Wellness Visit  12/06/2024   Colon Cancer Screening  08/31/2031   DTaP/Tdap/Td vaccine (3 - Td or Tdap) 04/22/2033   Pneumonia Vaccine  Completed   Hepatitis C Screening  Completed   Zoster (Shingles) Vaccine  Completed   HPV Vaccine  Aged Out   Meningitis B Vaccine  Aged Out  *Topic was postponed. The date shown is not the original due date.    See attachments for Fall Prevention Tips.

## 2023-12-10 ENCOUNTER — Encounter: Payer: Self-pay | Admitting: *Deleted

## 2023-12-27 DIAGNOSIS — H2513 Age-related nuclear cataract, bilateral: Secondary | ICD-10-CM | POA: Diagnosis not present

## 2024-01-02 ENCOUNTER — Ambulatory Visit

## 2024-01-09 DIAGNOSIS — R7989 Other specified abnormal findings of blood chemistry: Secondary | ICD-10-CM | POA: Diagnosis not present

## 2024-01-10 ENCOUNTER — Encounter: Payer: Self-pay | Admitting: Family Medicine

## 2024-01-10 LAB — COMPREHENSIVE METABOLIC PANEL WITH GFR
Albumin: 4.1 (ref 3.5–5.0)
Calcium: 9.1 (ref 8.7–10.7)
Globulin: 2.3
eGFR: 62

## 2024-01-10 LAB — HEPATIC FUNCTION PANEL
ALT: 14 U/L (ref 10–40)
AST: 21 (ref 14–40)
Alkaline Phosphatase: 63 (ref 25–125)
Bilirubin, Total: 0.5

## 2024-01-10 LAB — BASIC METABOLIC PANEL WITH GFR
BUN: 17 (ref 4–21)
CO2: 22 (ref 13–22)
Chloride: 104 (ref 99–108)
Creatinine: 1.2 (ref 0.6–1.3)
Glucose: 84
Potassium: 4.4 meq/L (ref 3.5–5.1)
Sodium: 138 (ref 137–147)

## 2024-01-30 DIAGNOSIS — L4050 Arthropathic psoriasis, unspecified: Secondary | ICD-10-CM | POA: Diagnosis not present

## 2024-01-30 DIAGNOSIS — R5383 Other fatigue: Secondary | ICD-10-CM | POA: Diagnosis not present

## 2024-01-30 DIAGNOSIS — Z79899 Other long term (current) drug therapy: Secondary | ICD-10-CM | POA: Diagnosis not present

## 2024-02-04 DIAGNOSIS — M1991 Primary osteoarthritis, unspecified site: Secondary | ICD-10-CM | POA: Diagnosis not present

## 2024-02-04 DIAGNOSIS — L4050 Arthropathic psoriasis, unspecified: Secondary | ICD-10-CM | POA: Diagnosis not present

## 2024-02-04 DIAGNOSIS — Z6825 Body mass index (BMI) 25.0-25.9, adult: Secondary | ICD-10-CM | POA: Diagnosis not present

## 2024-02-04 DIAGNOSIS — L409 Psoriasis, unspecified: Secondary | ICD-10-CM | POA: Diagnosis not present

## 2024-02-04 DIAGNOSIS — M545 Low back pain, unspecified: Secondary | ICD-10-CM | POA: Diagnosis not present

## 2024-02-04 DIAGNOSIS — Z1589 Genetic susceptibility to other disease: Secondary | ICD-10-CM | POA: Diagnosis not present

## 2024-02-04 DIAGNOSIS — Z79899 Other long term (current) drug therapy: Secondary | ICD-10-CM | POA: Diagnosis not present

## 2024-02-04 DIAGNOSIS — M7061 Trochanteric bursitis, right hip: Secondary | ICD-10-CM | POA: Diagnosis not present

## 2024-02-04 DIAGNOSIS — M542 Cervicalgia: Secondary | ICD-10-CM | POA: Diagnosis not present

## 2024-02-12 DIAGNOSIS — M545 Low back pain, unspecified: Secondary | ICD-10-CM | POA: Diagnosis not present

## 2024-02-12 DIAGNOSIS — M6281 Muscle weakness (generalized): Secondary | ICD-10-CM | POA: Diagnosis not present

## 2024-02-12 DIAGNOSIS — M542 Cervicalgia: Secondary | ICD-10-CM | POA: Diagnosis not present

## 2024-02-12 DIAGNOSIS — M25512 Pain in left shoulder: Secondary | ICD-10-CM | POA: Diagnosis not present

## 2024-02-12 DIAGNOSIS — M2569 Stiffness of other specified joint, not elsewhere classified: Secondary | ICD-10-CM | POA: Diagnosis not present

## 2024-02-12 DIAGNOSIS — M25511 Pain in right shoulder: Secondary | ICD-10-CM | POA: Diagnosis not present

## 2024-02-14 DIAGNOSIS — M6281 Muscle weakness (generalized): Secondary | ICD-10-CM | POA: Diagnosis not present

## 2024-02-14 DIAGNOSIS — M25512 Pain in left shoulder: Secondary | ICD-10-CM | POA: Diagnosis not present

## 2024-02-14 DIAGNOSIS — M2569 Stiffness of other specified joint, not elsewhere classified: Secondary | ICD-10-CM | POA: Diagnosis not present

## 2024-02-14 DIAGNOSIS — M542 Cervicalgia: Secondary | ICD-10-CM | POA: Diagnosis not present

## 2024-02-14 DIAGNOSIS — M545 Low back pain, unspecified: Secondary | ICD-10-CM | POA: Diagnosis not present

## 2024-02-14 DIAGNOSIS — M25511 Pain in right shoulder: Secondary | ICD-10-CM | POA: Diagnosis not present

## 2024-02-19 DIAGNOSIS — M6281 Muscle weakness (generalized): Secondary | ICD-10-CM | POA: Diagnosis not present

## 2024-02-19 DIAGNOSIS — M542 Cervicalgia: Secondary | ICD-10-CM | POA: Diagnosis not present

## 2024-02-19 DIAGNOSIS — M545 Low back pain, unspecified: Secondary | ICD-10-CM | POA: Diagnosis not present

## 2024-02-19 DIAGNOSIS — M25511 Pain in right shoulder: Secondary | ICD-10-CM | POA: Diagnosis not present

## 2024-02-19 DIAGNOSIS — M25512 Pain in left shoulder: Secondary | ICD-10-CM | POA: Diagnosis not present

## 2024-02-19 DIAGNOSIS — M2569 Stiffness of other specified joint, not elsewhere classified: Secondary | ICD-10-CM | POA: Diagnosis not present

## 2024-02-21 DIAGNOSIS — M542 Cervicalgia: Secondary | ICD-10-CM | POA: Diagnosis not present

## 2024-02-21 DIAGNOSIS — M545 Low back pain, unspecified: Secondary | ICD-10-CM | POA: Diagnosis not present

## 2024-02-21 DIAGNOSIS — M6281 Muscle weakness (generalized): Secondary | ICD-10-CM | POA: Diagnosis not present

## 2024-02-21 DIAGNOSIS — M25511 Pain in right shoulder: Secondary | ICD-10-CM | POA: Diagnosis not present

## 2024-02-21 DIAGNOSIS — M25512 Pain in left shoulder: Secondary | ICD-10-CM | POA: Diagnosis not present

## 2024-02-21 DIAGNOSIS — M2569 Stiffness of other specified joint, not elsewhere classified: Secondary | ICD-10-CM | POA: Diagnosis not present

## 2024-02-26 DIAGNOSIS — M545 Low back pain, unspecified: Secondary | ICD-10-CM | POA: Diagnosis not present

## 2024-02-26 DIAGNOSIS — M25511 Pain in right shoulder: Secondary | ICD-10-CM | POA: Diagnosis not present

## 2024-02-26 DIAGNOSIS — M2569 Stiffness of other specified joint, not elsewhere classified: Secondary | ICD-10-CM | POA: Diagnosis not present

## 2024-02-26 DIAGNOSIS — M6281 Muscle weakness (generalized): Secondary | ICD-10-CM | POA: Diagnosis not present

## 2024-02-26 DIAGNOSIS — M542 Cervicalgia: Secondary | ICD-10-CM | POA: Diagnosis not present

## 2024-02-26 DIAGNOSIS — M25512 Pain in left shoulder: Secondary | ICD-10-CM | POA: Diagnosis not present

## 2024-02-28 DIAGNOSIS — M25511 Pain in right shoulder: Secondary | ICD-10-CM | POA: Diagnosis not present

## 2024-02-28 DIAGNOSIS — Z79899 Other long term (current) drug therapy: Secondary | ICD-10-CM | POA: Diagnosis not present

## 2024-02-28 DIAGNOSIS — M2569 Stiffness of other specified joint, not elsewhere classified: Secondary | ICD-10-CM | POA: Diagnosis not present

## 2024-02-28 DIAGNOSIS — Z1589 Genetic susceptibility to other disease: Secondary | ICD-10-CM | POA: Diagnosis not present

## 2024-02-28 DIAGNOSIS — E663 Overweight: Secondary | ICD-10-CM | POA: Diagnosis not present

## 2024-02-28 DIAGNOSIS — M542 Cervicalgia: Secondary | ICD-10-CM | POA: Diagnosis not present

## 2024-02-28 DIAGNOSIS — M1991 Primary osteoarthritis, unspecified site: Secondary | ICD-10-CM | POA: Diagnosis not present

## 2024-02-28 DIAGNOSIS — L409 Psoriasis, unspecified: Secondary | ICD-10-CM | POA: Diagnosis not present

## 2024-02-28 DIAGNOSIS — M545 Low back pain, unspecified: Secondary | ICD-10-CM | POA: Diagnosis not present

## 2024-02-28 DIAGNOSIS — M25512 Pain in left shoulder: Secondary | ICD-10-CM | POA: Diagnosis not present

## 2024-02-28 DIAGNOSIS — L4050 Arthropathic psoriasis, unspecified: Secondary | ICD-10-CM | POA: Diagnosis not present

## 2024-02-28 DIAGNOSIS — M6281 Muscle weakness (generalized): Secondary | ICD-10-CM | POA: Diagnosis not present

## 2024-02-28 DIAGNOSIS — Z6825 Body mass index (BMI) 25.0-25.9, adult: Secondary | ICD-10-CM | POA: Diagnosis not present

## 2024-03-04 DIAGNOSIS — M25511 Pain in right shoulder: Secondary | ICD-10-CM | POA: Diagnosis not present

## 2024-03-04 DIAGNOSIS — M6281 Muscle weakness (generalized): Secondary | ICD-10-CM | POA: Diagnosis not present

## 2024-03-04 DIAGNOSIS — M2569 Stiffness of other specified joint, not elsewhere classified: Secondary | ICD-10-CM | POA: Diagnosis not present

## 2024-03-04 DIAGNOSIS — M25512 Pain in left shoulder: Secondary | ICD-10-CM | POA: Diagnosis not present

## 2024-03-04 DIAGNOSIS — M545 Low back pain, unspecified: Secondary | ICD-10-CM | POA: Diagnosis not present

## 2024-03-04 DIAGNOSIS — M542 Cervicalgia: Secondary | ICD-10-CM | POA: Diagnosis not present

## 2024-03-06 ENCOUNTER — Other Ambulatory Visit: Payer: Self-pay | Admitting: Family Medicine

## 2024-03-06 DIAGNOSIS — M25511 Pain in right shoulder: Secondary | ICD-10-CM | POA: Diagnosis not present

## 2024-03-06 DIAGNOSIS — M6281 Muscle weakness (generalized): Secondary | ICD-10-CM | POA: Diagnosis not present

## 2024-03-06 DIAGNOSIS — M25512 Pain in left shoulder: Secondary | ICD-10-CM | POA: Diagnosis not present

## 2024-03-06 DIAGNOSIS — M545 Low back pain, unspecified: Secondary | ICD-10-CM | POA: Diagnosis not present

## 2024-03-06 DIAGNOSIS — M2569 Stiffness of other specified joint, not elsewhere classified: Secondary | ICD-10-CM | POA: Diagnosis not present

## 2024-03-06 DIAGNOSIS — M542 Cervicalgia: Secondary | ICD-10-CM | POA: Diagnosis not present

## 2024-03-10 DIAGNOSIS — L570 Actinic keratosis: Secondary | ICD-10-CM | POA: Diagnosis not present

## 2024-03-11 DIAGNOSIS — M25511 Pain in right shoulder: Secondary | ICD-10-CM | POA: Diagnosis not present

## 2024-03-11 DIAGNOSIS — M542 Cervicalgia: Secondary | ICD-10-CM | POA: Diagnosis not present

## 2024-03-11 DIAGNOSIS — M6281 Muscle weakness (generalized): Secondary | ICD-10-CM | POA: Diagnosis not present

## 2024-03-11 DIAGNOSIS — M545 Low back pain, unspecified: Secondary | ICD-10-CM | POA: Diagnosis not present

## 2024-03-11 DIAGNOSIS — M25512 Pain in left shoulder: Secondary | ICD-10-CM | POA: Diagnosis not present

## 2024-03-11 DIAGNOSIS — M2569 Stiffness of other specified joint, not elsewhere classified: Secondary | ICD-10-CM | POA: Diagnosis not present

## 2024-03-13 DIAGNOSIS — M545 Low back pain, unspecified: Secondary | ICD-10-CM | POA: Diagnosis not present

## 2024-03-13 DIAGNOSIS — M542 Cervicalgia: Secondary | ICD-10-CM | POA: Diagnosis not present

## 2024-03-13 DIAGNOSIS — M25511 Pain in right shoulder: Secondary | ICD-10-CM | POA: Diagnosis not present

## 2024-03-13 DIAGNOSIS — M25512 Pain in left shoulder: Secondary | ICD-10-CM | POA: Diagnosis not present

## 2024-03-13 DIAGNOSIS — M6281 Muscle weakness (generalized): Secondary | ICD-10-CM | POA: Diagnosis not present

## 2024-03-13 DIAGNOSIS — M2569 Stiffness of other specified joint, not elsewhere classified: Secondary | ICD-10-CM | POA: Diagnosis not present

## 2024-03-18 DIAGNOSIS — M542 Cervicalgia: Secondary | ICD-10-CM | POA: Diagnosis not present

## 2024-03-18 DIAGNOSIS — M6281 Muscle weakness (generalized): Secondary | ICD-10-CM | POA: Diagnosis not present

## 2024-03-18 DIAGNOSIS — M2569 Stiffness of other specified joint, not elsewhere classified: Secondary | ICD-10-CM | POA: Diagnosis not present

## 2024-03-18 DIAGNOSIS — M25511 Pain in right shoulder: Secondary | ICD-10-CM | POA: Diagnosis not present

## 2024-03-18 DIAGNOSIS — M25512 Pain in left shoulder: Secondary | ICD-10-CM | POA: Diagnosis not present

## 2024-03-18 DIAGNOSIS — M545 Low back pain, unspecified: Secondary | ICD-10-CM | POA: Diagnosis not present

## 2024-03-20 DIAGNOSIS — M545 Low back pain, unspecified: Secondary | ICD-10-CM | POA: Diagnosis not present

## 2024-03-20 DIAGNOSIS — M25511 Pain in right shoulder: Secondary | ICD-10-CM | POA: Diagnosis not present

## 2024-03-20 DIAGNOSIS — M25512 Pain in left shoulder: Secondary | ICD-10-CM | POA: Diagnosis not present

## 2024-03-20 DIAGNOSIS — M542 Cervicalgia: Secondary | ICD-10-CM | POA: Diagnosis not present

## 2024-03-20 DIAGNOSIS — M2569 Stiffness of other specified joint, not elsewhere classified: Secondary | ICD-10-CM | POA: Diagnosis not present

## 2024-03-20 DIAGNOSIS — M6281 Muscle weakness (generalized): Secondary | ICD-10-CM | POA: Diagnosis not present

## 2024-03-25 DIAGNOSIS — M2569 Stiffness of other specified joint, not elsewhere classified: Secondary | ICD-10-CM | POA: Diagnosis not present

## 2024-03-25 DIAGNOSIS — M25512 Pain in left shoulder: Secondary | ICD-10-CM | POA: Diagnosis not present

## 2024-03-25 DIAGNOSIS — M545 Low back pain, unspecified: Secondary | ICD-10-CM | POA: Diagnosis not present

## 2024-03-25 DIAGNOSIS — M25511 Pain in right shoulder: Secondary | ICD-10-CM | POA: Diagnosis not present

## 2024-03-25 DIAGNOSIS — M542 Cervicalgia: Secondary | ICD-10-CM | POA: Diagnosis not present

## 2024-03-25 DIAGNOSIS — M6281 Muscle weakness (generalized): Secondary | ICD-10-CM | POA: Diagnosis not present

## 2024-03-26 DIAGNOSIS — L4050 Arthropathic psoriasis, unspecified: Secondary | ICD-10-CM | POA: Diagnosis not present

## 2024-03-27 DIAGNOSIS — M25511 Pain in right shoulder: Secondary | ICD-10-CM | POA: Diagnosis not present

## 2024-03-27 DIAGNOSIS — M6281 Muscle weakness (generalized): Secondary | ICD-10-CM | POA: Diagnosis not present

## 2024-03-27 DIAGNOSIS — M545 Low back pain, unspecified: Secondary | ICD-10-CM | POA: Diagnosis not present

## 2024-03-27 DIAGNOSIS — M542 Cervicalgia: Secondary | ICD-10-CM | POA: Diagnosis not present

## 2024-03-27 DIAGNOSIS — M25512 Pain in left shoulder: Secondary | ICD-10-CM | POA: Diagnosis not present

## 2024-03-27 DIAGNOSIS — M2569 Stiffness of other specified joint, not elsewhere classified: Secondary | ICD-10-CM | POA: Diagnosis not present

## 2024-04-17 ENCOUNTER — Inpatient Hospital Stay: Payer: Medicare PPO | Attending: Family

## 2024-04-17 ENCOUNTER — Encounter: Payer: Self-pay | Admitting: Hematology & Oncology

## 2024-04-17 ENCOUNTER — Inpatient Hospital Stay: Payer: Medicare PPO | Admitting: Hematology & Oncology

## 2024-04-17 VITALS — BP 125/84 | HR 85 | Temp 97.9°F | Resp 16 | Wt 167.1 lb

## 2024-04-17 DIAGNOSIS — Z7901 Long term (current) use of anticoagulants: Secondary | ICD-10-CM | POA: Diagnosis not present

## 2024-04-17 DIAGNOSIS — D6851 Activated protein C resistance: Secondary | ICD-10-CM

## 2024-04-17 DIAGNOSIS — M069 Rheumatoid arthritis, unspecified: Secondary | ICD-10-CM | POA: Insufficient documentation

## 2024-04-17 DIAGNOSIS — I824Y1 Acute embolism and thrombosis of unspecified deep veins of right proximal lower extremity: Secondary | ICD-10-CM | POA: Diagnosis not present

## 2024-04-17 DIAGNOSIS — R634 Abnormal weight loss: Secondary | ICD-10-CM | POA: Diagnosis not present

## 2024-04-17 DIAGNOSIS — Z8672 Personal history of thrombophlebitis: Secondary | ICD-10-CM | POA: Insufficient documentation

## 2024-04-17 DIAGNOSIS — Z7982 Long term (current) use of aspirin: Secondary | ICD-10-CM | POA: Diagnosis not present

## 2024-04-17 LAB — CBC WITH DIFFERENTIAL (CANCER CENTER ONLY)
Abs Immature Granulocytes: 0.02 K/uL (ref 0.00–0.07)
Basophils Absolute: 0.1 K/uL (ref 0.0–0.1)
Basophils Relative: 1 %
Eosinophils Absolute: 0.6 K/uL — ABNORMAL HIGH (ref 0.0–0.5)
Eosinophils Relative: 8 %
HCT: 44.6 % (ref 39.0–52.0)
Hemoglobin: 15.1 g/dL (ref 13.0–17.0)
Immature Granulocytes: 0 %
Lymphocytes Relative: 32 %
Lymphs Abs: 2.4 K/uL (ref 0.7–4.0)
MCH: 33.2 pg (ref 26.0–34.0)
MCHC: 33.9 g/dL (ref 30.0–36.0)
MCV: 98 fL (ref 80.0–100.0)
Monocytes Absolute: 0.5 K/uL (ref 0.1–1.0)
Monocytes Relative: 7 %
Neutro Abs: 3.9 K/uL (ref 1.7–7.7)
Neutrophils Relative %: 52 %
Platelet Count: 199 K/uL (ref 150–400)
RBC: 4.55 MIL/uL (ref 4.22–5.81)
RDW: 13.2 % (ref 11.5–15.5)
WBC Count: 7.5 K/uL (ref 4.0–10.5)
nRBC: 0 % (ref 0.0–0.2)

## 2024-04-17 LAB — CMP (CANCER CENTER ONLY)
ALT: 18 U/L (ref 0–44)
AST: 28 U/L (ref 15–41)
Albumin: 4.2 g/dL (ref 3.5–5.0)
Alkaline Phosphatase: 61 U/L (ref 38–126)
Anion gap: 11 (ref 5–15)
BUN: 15 mg/dL (ref 8–23)
CO2: 25 mmol/L (ref 22–32)
Calcium: 9.3 mg/dL (ref 8.9–10.3)
Chloride: 105 mmol/L (ref 98–111)
Creatinine: 1.28 mg/dL — ABNORMAL HIGH (ref 0.61–1.24)
GFR, Estimated: 59 mL/min — ABNORMAL LOW (ref >60)
Glucose, Bld: 111 mg/dL — ABNORMAL HIGH (ref 70–99)
Potassium: 4.5 mmol/L (ref 3.5–5.1)
Sodium: 140 mmol/L (ref 135–145)
Total Bilirubin: 0.4 mg/dL (ref 0.0–1.2)
Total Protein: 6.6 g/dL (ref 6.5–8.1)

## 2024-04-17 LAB — LACTATE DEHYDROGENASE: LDH: 181 U/L (ref 98–192)

## 2024-04-17 NOTE — Progress Notes (Signed)
 Hematology and Oncology Follow Up Visit  Randall Scott [id number] Apr 10, 1951 73 y.o. 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 daily-maintenance therapy for 1 year-complete in August 2020 EC ASA 162 mg po q day -- start on 09/16/2021 Xarelto  20 mg p.o. daily-6 weeks - finish up on 08/22/2021      Interim History:  Randall Scott is back for follow-up.  His main problem has been his rheumatoid arthritis.  He had to go off the methotrexate because of some kidney issues.  He said once he got off methotrexate, his arthritis flared up.  He now is on leflunomide.  Hopefully, this will help his arthritis in his pain.  He has had no problems with aspirin.  He is on 2 baby aspirin a day for his history of thromboembolic disease.  He has had no cough or shortness of breath.  He has had no nausea or vomiting.  There is been no change in bowel or bladder habits.  He has had no rashes.  He has had no bleeding.  His weight is down.  He has lost some weight.  He says he just has not been eating because of all the pain.  Currently, I would have to say that his performance status is probably ECOG 1.    Medications:  Current Outpatient Medications:    Acetaminophen (TYLENOL EXTRA STRENGTH PO), Take by mouth as needed., Disp: , Rfl:    aspirin 81 MG EC tablet, Take 162 mg by mouth daily., Disp: , Rfl:    golimumab  (SIMPONI  ARIA) 50 MG/4ML SOLN injection, every 8 (eight) weeks., Disp: , Rfl:    leflunomide (ARAVA) 20 MG tablet, Take 20 mg by mouth daily., Disp: , Rfl:    omeprazole  (PRILOSEC) 40 MG capsule, Take 1 capsule (40 mg total) by mouth daily., Disp: 90 capsule, Rfl: 1  Allergies: No Known Allergies  Past Medical History, Surgical history, Social  history, and Family History were reviewed and updated.  Review of Systems: Review of Systems  Constitutional:  Negative for appetite change, fatigue, fever and unexpected weight change.  HENT:   Negative for lump/mass, mouth sores, sore throat and trouble swallowing.   Respiratory:  Negative for cough, hemoptysis and shortness of breath.   Cardiovascular:  Negative for leg swelling and palpitations.  Gastrointestinal:  Negative for abdominal distention, abdominal pain, blood in stool, constipation, diarrhea, nausea and vomiting.  Genitourinary:  Negative for bladder incontinence, dysuria, frequency and hematuria.   Musculoskeletal:  Negative for arthralgias, back pain, gait problem and myalgias.  Skin:  Negative for itching and rash.  Neurological:  Negative for dizziness, extremity weakness, gait problem, headaches, numbness, seizures and speech difficulty.  Hematological:  Does not bruise/bleed easily.  Psychiatric/Behavioral:  Negative for depression and sleep disturbance. The patient is not nervous/anxious.     Physical Exam:  weight is 167 lb 1.9 oz (75.8 kg). His oral temperature is 97.9 F (36.6 C). His blood pressure is 130/92 (abnormal) and his pulse is 85. His respiration is 16 and oxygen saturation is 99%.   Wt Readings from Last 3 Encounters:  04/17/24 167 lb 1.9 oz (75.8 kg)  12/07/23 178 lb (80.7 kg)  11/20/23 178 lb (80.7 kg)    Physical Exam Vitals reviewed.  HENT:  Head: Normocephalic and atraumatic.  Eyes:     Pupils: Pupils are equal, round, and reactive to light.  Cardiovascular:     Rate and Rhythm: Normal rate and regular rhythm.     Heart sounds: Normal heart sounds.  Pulmonary:     Effort: Pulmonary effort is normal.     Breath sounds: Normal breath sounds.  Abdominal:     General: Bowel sounds are normal.     Palpations: Abdomen is soft.  Musculoskeletal:        General: No tenderness or deformity. Normal range of motion.     Cervical back:  Normal range of motion.  Lymphadenopathy:     Cervical: No cervical adenopathy.  Skin:    General: Skin is warm and dry.     Findings: No erythema or rash.  Neurological:     Mental Status: He is alert and oriented to person, place, and time.  Psychiatric:        Behavior: Behavior normal.        Thought Content: Thought content normal.        Judgment: Judgment normal.      Lab Results  Component Value Date   WBC 7.5 04/17/2024   HGB 15.1 04/17/2024   HCT 44.6 04/17/2024   MCV 98.0 04/17/2024   PLT 199 04/17/2024     Chemistry      Component Value Date/Time   NA 138 01/10/2024 0000   NA 147 (H) 04/13/2017 0936   K 4.4 01/10/2024 0000   K 4.4 04/13/2017 0936   CL 104 01/10/2024 0000   CL 108 04/13/2017 0936   CO2 22 01/10/2024 0000   CO2 33 04/13/2017 0936   BUN 17 01/10/2024 0000   BUN 13 04/13/2017 0936   CREATININE 1.2 01/10/2024 0000   CREATININE 1.36 (H) 04/18/2023 0801   CREATININE 1.5 (H) 04/13/2017 0936   GLU 84 01/10/2024 0000      Component Value Date/Time   CALCIUM 9.1 01/10/2024 0000   CALCIUM 10.0 04/13/2017 0936   ALKPHOS 63 01/10/2024 0000   ALKPHOS 51 04/13/2017 0936   AST 21 01/10/2024 0000   AST 30 04/18/2023 0801   ALT 14 01/10/2024 0000   ALT 25 04/18/2023 0801   ALT 34 04/13/2017 0936   BILITOT 0.8 04/18/2023 0801      Impression and Plan: Randall Scott is a 73 year old male. He has a Factor V Leiden mutation. He is heterozygous for this.  Hopefully, the issues with his rheumatoid arthritis will improve.  Hopefully, his weight will also start going back up.  From my point of view, I do not see any issues with his coagulation.  I do not see any problems with thromboembolism.  We will plan to get him back in another 6 months.    Maude JONELLE Crease, MD 10/16/20258:31 AM

## 2024-04-19 NOTE — Progress Notes (Addendum)
 [facility] [address], [address] [place], [place] [address] [phone]  Date:  04/23/2024   Name:  Randall Scott   DOB:  02-10-1951   MRN:  [id number]  PCP:  Watt Harlene JAYSON, MD    Chief Complaint: No chief complaint on file.   History of Present Illness:  Randall Scott is a 73 y.o. very pleasant male patient who presents with the following:  Patient seen today for physical exam-I saw him most recently about a year ago Pt with history of Factor 5 leiden and DVT in 2018- he was treated with xarelto  in the past but now just on baby aspirin  Also history of prediabetes, psoriasis/ psoriatic arthritis  His psoriatic arthritis is managed by Dr. Ishmael at [facility] rheumatology -he is using Symponi is done every 2 months   He was seen by hematology, Dr. Timmy on 10/16-stable from a factor V Leiden standpoint.  Using 2 baby aspirin daily. Reviewed most recent rheumatology note on chart-he has been having more joint issues. They had to stop methotrexate due to a bump in his creatinine They changed him over to [place]   -Flu shot - Recommend COVID booster - Colon cancer screening is up today - He has completed Shingrix, recommend 1 dose of RSV Dr. Timmy did some labs last month-creatinine is stable and blood counts normal His PSA did bump up last year; I asked him to recheck in 6 months but we did not hear from him Discussed the use of AI scribe software for clinical note transcription with the patient, who gave verbal consent to proceed.  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   Headache 08/21/2013   Lateral epicondylitis of right  elbow 04/01/2013    Past Medical History:  Diagnosis Date   Allergy    Arthritis    Asthma    Cataract    GERD (gastroesophageal reflux disease)     Past Surgical History:  Procedure Laterality Date   COLONOSCOPY  08/30/2021   EYE SURGERY     HERNIA REPAIR     PERIPHERAL VASCULAR THROMBECTOMY Right    RLE   UPPER GASTROINTESTINAL ENDOSCOPY  08/30/2021    Social History   Tobacco Use   Smoking status: Never   Smokeless tobacco: Never  Vaping Use   Vaping status: Never Used  Substance Use Topics   Alcohol use: No   Drug use: No    Family History  Problem Relation Age of Onset   Lung cancer Mother    Lung cancer Father    Sudden death Neg Hx    Hypertension Neg Hx    Hyperlipidemia Neg Hx    Heart attack Neg Hx    Diabetes Neg Hx    Colon cancer Neg Hx    Rectal cancer Neg Hx    Esophageal cancer Neg Hx    Pancreatic cancer Neg Hx    Stomach cancer Neg Hx     No Known Allergies  Medication list has been reviewed and updated.  Current Outpatient Medications on File Prior to Visit  Medication Sig Dispense Refill   Acetaminophen (TYLENOL EXTRA STRENGTH PO)  Take by mouth as needed.     aspirin 81 MG EC tablet Take 162 mg by mouth daily.     golimumab  (SIMPONI  ARIA) 50 MG/4ML SOLN injection every 8 (eight) weeks.     leflunomide (ARAVA) 20 MG tablet Take 20 mg by mouth daily.     omeprazole  (PRILOSEC) 40 MG capsule Take 1 capsule (40 mg total) by mouth daily. 90 capsule 1   No current facility-administered medications on file prior to visit.    Review of Systems:  As per HPI- otherwise negative.   Physical Examination: There were no vitals filed for this visit. There were no vitals filed for this visit. There is no height or weight on file to calculate BMI. Ideal Body Weight:    GEN: no acute distress. HEENT: Atraumatic, Normocephalic.  Ears and Nose: No external deformity. CV: RRR, No M/G/R. No JVD. No thrill. No extra heart sounds. PULM: CTA  B, no wheezes, crackles, rhonchi. No retractions. No resp. distress. No accessory muscle use. ABD: S, NT, ND, +BS. No rebound. No HSM. EXTR: No c/c/e PSYCH: Normally interactive. Conversant.    Assessment and Plan: No diagnosis found.  Assessment & Plan   Signed Harlene Schroeder, MD

## 2024-04-19 NOTE — Patient Instructions (Addendum)
 It was good to see you again today, I will be in touch with your blood work Recommend COVID booster this fall if not done already, also recommend 1 dose of RSV Flu shot today   Try the trazodone for sleep- let me know how this works for you After your lab draw you can stop by imaging on the ground floor to set up a bone density scan

## 2024-04-21 ENCOUNTER — Encounter: Admitting: Family Medicine

## 2024-04-23 ENCOUNTER — Ambulatory Visit: Payer: Medicare PPO | Admitting: Family Medicine

## 2024-04-23 ENCOUNTER — Encounter: Payer: Self-pay | Admitting: Family Medicine

## 2024-04-23 VITALS — BP 132/80 | HR 88 | Temp 97.8°F | Ht 68.0 in | Wt 168.0 lb

## 2024-04-23 DIAGNOSIS — Z13220 Encounter for screening for lipoid disorders: Secondary | ICD-10-CM

## 2024-04-23 DIAGNOSIS — Z23 Encounter for immunization: Secondary | ICD-10-CM | POA: Diagnosis not present

## 2024-04-23 DIAGNOSIS — Z0000 Encounter for general adult medical examination without abnormal findings: Secondary | ICD-10-CM

## 2024-04-23 DIAGNOSIS — Z9189 Other specified personal risk factors, not elsewhere classified: Secondary | ICD-10-CM

## 2024-04-23 DIAGNOSIS — Z131 Encounter for screening for diabetes mellitus: Secondary | ICD-10-CM

## 2024-04-23 DIAGNOSIS — G4701 Insomnia due to medical condition: Secondary | ICD-10-CM

## 2024-04-23 DIAGNOSIS — Z125 Encounter for screening for malignant neoplasm of prostate: Secondary | ICD-10-CM

## 2024-04-23 LAB — HEMOGLOBIN A1C: Hgb A1c MFr Bld: 6.1 % (ref 4.6–6.5)

## 2024-04-23 LAB — PSA, MEDICARE: PSA: 2.56 ng/mL (ref 0.10–4.00)

## 2024-04-23 LAB — LIPID PANEL
Cholesterol: 159 mg/dL (ref 0–200)
HDL: 53.3 mg/dL (ref >39.00)
LDL Cholesterol: 92 mg/dL (ref 0–99)
NonHDL: 105.42
Total CHOL/HDL Ratio: 3
Triglycerides: 66 mg/dL (ref 0.0–149.0)
VLDL: 13.2 mg/dL (ref 0.0–40.0)

## 2024-04-23 MED ORDER — TRAZODONE HCL 50 MG PO TABS: 25.0000 mg | ORAL_TABLET | Freq: Every evening | ORAL | 3 refills | Status: AC | PRN

## 2024-04-23 NOTE — Addendum Note (Signed)
 Addended by: ORVIN HARLENE HERO on: 04/23/2024 11:44 AM   Modules accepted: Orders

## 2024-04-28 ENCOUNTER — Encounter: Payer: Self-pay | Admitting: Family Medicine

## 2024-04-28 ENCOUNTER — Ambulatory Visit (HOSPITAL_BASED_OUTPATIENT_CLINIC_OR_DEPARTMENT_OTHER)
Admission: RE | Admit: 2024-04-28 | Discharge: 2024-04-28 | Disposition: A | Discharge status: Home / Self Care | Source: Ambulatory Visit | Attending: Family Medicine | Admitting: Family Medicine

## 2024-04-28 DIAGNOSIS — Z7952 Long term (current) use of systemic steroids: Secondary | ICD-10-CM | POA: Diagnosis not present

## 2024-04-28 DIAGNOSIS — Z9189 Other specified personal risk factors, not elsewhere classified: Secondary | ICD-10-CM | POA: Insufficient documentation

## 2024-04-30 DIAGNOSIS — L4050 Arthropathic psoriasis, unspecified: Secondary | ICD-10-CM | POA: Diagnosis not present

## 2024-04-30 DIAGNOSIS — E663 Overweight: Secondary | ICD-10-CM | POA: Diagnosis not present

## 2024-04-30 DIAGNOSIS — M1991 Primary osteoarthritis, unspecified site: Secondary | ICD-10-CM | POA: Diagnosis not present

## 2024-04-30 DIAGNOSIS — Z6825 Body mass index (BMI) 25.0-25.9, adult: Secondary | ICD-10-CM | POA: Diagnosis not present

## 2024-04-30 DIAGNOSIS — Z1589 Genetic susceptibility to other disease: Secondary | ICD-10-CM | POA: Diagnosis not present

## 2024-04-30 DIAGNOSIS — Z79899 Other long term (current) drug therapy: Secondary | ICD-10-CM | POA: Diagnosis not present

## 2024-04-30 DIAGNOSIS — L409 Psoriasis, unspecified: Secondary | ICD-10-CM | POA: Diagnosis not present

## 2024-05-21 DIAGNOSIS — Z111 Encounter for screening for respiratory tuberculosis: Secondary | ICD-10-CM | POA: Diagnosis not present

## 2024-05-21 DIAGNOSIS — R5383 Other fatigue: Secondary | ICD-10-CM | POA: Diagnosis not present

## 2024-05-21 DIAGNOSIS — L4050 Arthropathic psoriasis, unspecified: Secondary | ICD-10-CM | POA: Diagnosis not present

## 2024-05-26 ENCOUNTER — Encounter: Payer: Self-pay | Admitting: Family Medicine

## 2024-05-26 ENCOUNTER — Telehealth: Payer: Self-pay

## 2024-05-26 DIAGNOSIS — M25519 Pain in unspecified shoulder: Secondary | ICD-10-CM

## 2024-05-26 NOTE — Telephone Encounter (Signed)
 Copied from CRM #[id number]. Topic: Clinical - Medication Question >> May 26, 2024  8:28 AM Aleatha C wrote: Reason for CRM: Patient would like a MRI schedule for left shoulder still having pain and patient has been on pretnizol and it has not help and would like to see if there is something else going on then just arthritis in shoulder because contact him with further info

## 2024-05-27 ENCOUNTER — Other Ambulatory Visit (INDEPENDENT_AMBULATORY_CARE_PROVIDER_SITE_OTHER): Payer: Self-pay

## 2024-05-27 ENCOUNTER — Ambulatory Visit (INDEPENDENT_AMBULATORY_CARE_PROVIDER_SITE_OTHER): Admitting: Orthopaedic Surgery

## 2024-05-27 ENCOUNTER — Other Ambulatory Visit: Payer: Self-pay

## 2024-05-27 DIAGNOSIS — M25512 Pain in left shoulder: Secondary | ICD-10-CM

## 2024-05-27 DIAGNOSIS — G8929 Other chronic pain: Secondary | ICD-10-CM

## 2024-05-27 DIAGNOSIS — M542 Cervicalgia: Secondary | ICD-10-CM

## 2024-05-27 NOTE — Progress Notes (Signed)
 Office Visit Note   Patient: Randall Scott           Date of Birth: [birth year]           MRN: [id number] Visit Date: 05/27/2024              Requested by: Watt Harlene JAYSON, MD [address] [place],  [place] [address] PCP: Watt Harlene JAYSON, MD   Assessment & Plan: Visit Diagnoses:  1. Chronic left shoulder pain   2. Neck pain     Plan: History of Present Illness Randall Scott is a 73 year old male with psoriatic arthritis who presents with left shoulder pain.  He has had constant left shoulder pain for about three months that aches like a toothache and radiates to the elbow, wrist, and arm muscles, without relief even at rest.  He is right-handed with psoriatic arthritis involving the shoulders and hips. Prednisone  a week ago gave only temporary relief. Methotrexate was stopped due to elevated creatinine and pain worsened afterward. His current arthritis medication has not improved the shoulder pain.  He denies numbness, tingling, or burning pain in the arm. He notes some lateral neck discomfort and a sensation of "stuff moving around" in the shoulder that he attributes to arthritis.  Physical Exam MUSCULOSKELETAL: Shoulder flexibility normal. Supraspinatus and infraspinatus strength normal. No rotator cuff or bursitis issues detected.  Negative spurling.  RADIOLOGY Left shoulder X-ray: No significant abnormalities detected  Assessment and Plan Left shoulder pain probably due to psoriatic arthritis Pain likely from psoriatic arthritis flare-up in shoulder joint with secondary muscular pain. X-rays unremarkable. Previous prednisone  response indicates inflammation. Current medication may be insufficient. - will refer to Dr. Burnetta for shoulder injection. - Consider MRI if no relief in six weeks.  Follow-Up Instructions: No follow-ups on file.   Orders:  Orders Placed This Encounter  Procedures   XR Shoulder Left   XR Cervical Spine 2 or 3 views   No orders  of the defined types were placed in this encounter.     Procedures: No procedures performed   Clinical Data: No additional findings.   Subjective: Chief Complaint  Patient presents with   Left Shoulder - Pain   Neck - Pain    HPI  Review of Systems  Constitutional: Negative.   HENT: Negative.    Eyes: Negative.   Respiratory: Negative.    Cardiovascular: Negative.   Gastrointestinal: Negative.   Endocrine: Negative.   Genitourinary: Negative.   Skin: Negative.   Allergic/Immunologic: Negative.   Neurological: Negative.   Hematological: Negative.   Psychiatric/Behavioral: Negative.    All other systems reviewed and are negative.    Objective: Vital Signs: There were no vitals taken for this visit.  Physical Exam Vitals and nursing note reviewed.  Constitutional:      Appearance: He is well-developed.  HENT:     Head: Normocephalic and atraumatic.  Eyes:     Pupils: Pupils are equal, round, and reactive to light.  Pulmonary:     Effort: Pulmonary effort is normal.  Abdominal:     Palpations: Abdomen is soft.  Musculoskeletal:        General: Normal range of motion.     Cervical back: Neck supple.  Skin:    General: Skin is warm.  Neurological:     Mental Status: He is alert and oriented to person, place, and time.  Psychiatric:        Behavior: Behavior normal.  Thought Content: Thought content normal.        Judgment: Judgment normal.     Ortho Exam  Specialty Comments:  No specialty comments available.  Imaging: XR Shoulder Left Result Date: 05/27/2024 X-rays of the left shoulder show no acute or structural abnormalities   XR Cervical Spine 2 or 3 views Result Date: 05/27/2024 Xrays show diffuse degenerative changes.      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   Headache 08/21/2013   Lateral epicondylitis of right elbow 04/01/2013   Past Medical History:  Diagnosis Date   Allergy    Arthritis    Asthma    Cataract    GERD (gastroesophageal reflux disease)     Family History  Problem Relation Age of Onset   Lung cancer Mother    Lung cancer Father    Sudden death Neg Hx    Hypertension Neg Hx    Hyperlipidemia Neg Hx    Heart attack Neg Hx    Diabetes Neg Hx    Colon cancer Neg Hx    Rectal cancer Neg Hx    Esophageal cancer Neg Hx    Pancreatic cancer Neg Hx    Stomach cancer Neg Hx     Past Surgical History:  Procedure Laterality Date   COLONOSCOPY  08/30/2021   EYE SURGERY     HERNIA REPAIR     PERIPHERAL VASCULAR THROMBECTOMY Right    RLE   UPPER GASTROINTESTINAL ENDOSCOPY  08/30/2021   Social History   Occupational History   Occupation: Chiropodist: [employer]   Occupation: retired  Tobacco Use   Smoking status: Never   Smokeless tobacco: Never  Vaping Use   Vaping status: Never Used  Substance and Sexual Activity   Alcohol use: No   Drug use: No   Sexual activity: Yes    Birth control/protection: None    Comment: married

## 2024-06-06 ENCOUNTER — Ambulatory Visit (INDEPENDENT_AMBULATORY_CARE_PROVIDER_SITE_OTHER): Admitting: Sports Medicine

## 2024-06-06 ENCOUNTER — Encounter: Payer: Self-pay | Admitting: Sports Medicine

## 2024-06-06 ENCOUNTER — Other Ambulatory Visit: Payer: Self-pay

## 2024-06-06 DIAGNOSIS — G8929 Other chronic pain: Secondary | ICD-10-CM | POA: Diagnosis not present

## 2024-06-06 DIAGNOSIS — M25512 Pain in left shoulder: Secondary | ICD-10-CM

## 2024-06-06 MED ORDER — BUPIVACAINE HCL 0.25 % IJ SOLN
2.0000 mL | INTRAMUSCULAR | Status: AC | PRN
  Administered 2024-06-06: 2 mL via INTRA_ARTICULAR

## 2024-06-06 MED ORDER — METHYLPREDNISOLONE ACETATE 40 MG/ML IJ SUSP
80.0000 mg | INTRAMUSCULAR | Status: AC | PRN
  Administered 2024-06-06: 80 mg via INTRA_ARTICULAR

## 2024-06-06 MED ORDER — LIDOCAINE HCL 1 % IJ SOLN
2.0000 mL | INTRAMUSCULAR | Status: AC | PRN
  Administered 2024-06-06: 2 mL

## 2024-06-06 NOTE — Progress Notes (Signed)
   Procedure Note  Patient: Randall Scott             Date of Birth: 1950/09/10           MRN: [id number]             Visit Date: 06/06/2024  Procedures: Visit Diagnoses:  1. Chronic left shoulder pain    Large Joint Inj: L glenohumeral on 06/06/2024 3:25 PM Indications: pain Details: 22 G 3.5 in needle, ultrasound-guided posterior approach Medications: 2 mL lidocaine  1 %; 2 mL bupivacaine  0.25 %; 80 mg methylPREDNISolone  acetate 40 MG/ML Outcome: tolerated well, no immediate complications  US -Guided Greater Trochanteric Bursa Injection, left After discussion on risks/benefits/indications and informed verbal consent was obtained, a timeout was performed. The patient was lying in lateral recumbent position on exam table. Using ultrasound guidance, the greater trochanter was identified. The area overlying the trochanteric bursa was then prepped with Betadine and alcohol swabs. Following sterile precautions, ultrasound was reapplied to visualize needle guidance with a 22-gauge 3.5 needle utilizing an in-plane approach to inject the bursa with 2:2:2 lidocaine :bupivicaine:betamethasone . Delivery of the injectate was visualized into the region of hypoechoic fluid of the greater trochanteric bursa. Patient tolerated procedure well without immediate complications.    Procedure, treatment alternatives, risks and benefits explained, specific risks discussed. Consent was given by the patient. Immediately prior to procedure a time out was called to verify the correct patient, procedure, equipment, support staff and site/side marked as required. Patient was prepped and draped in the usual sterile fashion.     - patient tolerated procedure well, discussed post-injection protocol - follow-up with Dr. Jerri for shoulder as indicated; I am happy to see them as needed  Lonell Sprang, DO Primary Care Sports Medicine Physician  [facility]  This note was dictated using Dragon naturally  speaking software and may contain errors in syntax, spelling, or content which have not been identified prior to signing this note.

## 2024-07-02 ENCOUNTER — Other Ambulatory Visit: Payer: Self-pay

## 2024-07-02 ENCOUNTER — Telehealth: Payer: Self-pay | Admitting: Orthopaedic Surgery

## 2024-07-02 DIAGNOSIS — G8929 Other chronic pain: Secondary | ICD-10-CM

## 2024-07-02 NOTE — Telephone Encounter (Signed)
 Pt called saying that the cortisone injection didn't work and would like to go ahead and schedule an MRI. Call back number is [phone].

## 2024-07-02 NOTE — Telephone Encounter (Signed)
 sure

## 2024-07-07 ENCOUNTER — Encounter: Payer: Self-pay | Admitting: Podiatry

## 2024-07-07 ENCOUNTER — Encounter: Payer: Self-pay | Admitting: Physician Assistant

## 2024-07-07 ENCOUNTER — Ambulatory Visit: Admitting: Podiatry

## 2024-07-07 VITALS — Ht 68.0 in | Wt 168.0 lb

## 2024-07-07 DIAGNOSIS — M79674 Pain in right toe(s): Secondary | ICD-10-CM | POA: Diagnosis not present

## 2024-07-07 DIAGNOSIS — B351 Tinea unguium: Secondary | ICD-10-CM | POA: Diagnosis not present

## 2024-07-07 DIAGNOSIS — L600 Ingrowing nail: Secondary | ICD-10-CM | POA: Diagnosis not present

## 2024-07-07 NOTE — Progress Notes (Signed)
" ° °  Chief Complaint  Patient presents with   Nail Problem    Patient is here for left 2nd toe ingrown lateral border, and right hallux possible nail removal due to thick and cracked    Subjective: Patient presents today for evaluation of pain to the lateral border of the left second toe ongoing for several years. Patient is concerned for possible ingrown nail.  It is very sensitive to touch.    Also complains of a thick dystrophic cracked nail to the right hallux nail plate.  He states that it appears that 1 nail is growing on top of another.  Patient presents today for further treatment and evaluation.  Past Medical History:  Diagnosis Date   Allergy    Arthritis    Asthma    Cataract    GERD (gastroesophageal reflux disease)     Past Surgical History:  Procedure Laterality Date   COLONOSCOPY  08/30/2021   EYE SURGERY     HERNIA REPAIR     PERIPHERAL VASCULAR THROMBECTOMY Right    RLE   UPPER GASTROINTESTINAL ENDOSCOPY  08/30/2021    Allergies[1]  Objective:  General: Well developed, nourished, in no acute distress, alert and oriented x3   Dermatology: Skin is warm, dry and supple bilateral.  Lateral border of the left second toenail is tender with evidence of an ingrowing nail. Pain on palpation noted to the border of the nail fold.  Hyperkeratotic dystrophic nail noted to the right hallux nail plate  Vascular: DP and PT pulses palpable.  No clinical evidence of vascular compromise  Neruologic: Grossly intact via light touch bilateral.  Musculoskeletal: No pedal deformity noted  Assesement: #1 Paronychia with ingrowing nail lateral border left second toe #2 dystrophic toenail right hallux nail plate  Plan of Care:  -Patient evaluated.  -In regards to the right hallux nail plate, I do believe a simple debridement should help alleviate a lot of patient's symptoms.  There is a thick portion of dystrophic nail which was debrided today and smoothed with a rotary  bur. -Discussed treatment alternatives and plan of care. Explained nail avulsion procedure and post procedure course to patient. -Patient opted for permanent partial nail avulsion of the ingrown portion of the nail.  -Prior to procedure, local anesthesia infiltration utilized using 3 ml of a 50:50 mixture of 2% plain lidocaine  and 0.5% plain marcaine  in a normal hallux block fashion and a betadine prep performed.  -Partial permanent nail avulsion with chemical matrixectomy performed using 3x30sec applications of phenol followed by alcohol flush.  -Light dressing applied.  Post care instructions provided -Return to clinic 3 weeks  Thresa EMERSON Sar, DPM [facility]  Dr. Thresa EMERSON Sar, DPM    [address] [place], [place] [address]                Office [phone]  Fax [phone]        [1] No Known Allergies  "

## 2024-07-07 NOTE — Patient Instructions (Signed)

## 2024-07-09 ENCOUNTER — Ambulatory Visit
Admission: RE | Admit: 2024-07-09 | Discharge: 2024-07-09 | Disposition: A | Discharge status: Home / Self Care | Source: Ambulatory Visit | Attending: Physician Assistant | Admitting: Physician Assistant

## 2024-07-09 DIAGNOSIS — G8929 Other chronic pain: Secondary | ICD-10-CM

## 2024-07-11 ENCOUNTER — Ambulatory Visit: Payer: Self-pay | Admitting: Physician Assistant

## 2024-07-11 NOTE — Progress Notes (Signed)
 Patient is scheduled 1/13 with Dr. Jerri

## 2024-07-11 NOTE — Progress Notes (Signed)
F/u with xu

## 2024-07-15 ENCOUNTER — Ambulatory Visit: Admitting: Orthopaedic Surgery

## 2024-07-15 DIAGNOSIS — M7542 Impingement syndrome of left shoulder: Secondary | ICD-10-CM

## 2024-07-15 NOTE — Progress Notes (Signed)
 "  Office Visit Note   Patient: Randall Scott           Date of Birth: 15-Sep-1950           MRN: [id number] Visit Date: 07/15/2024              Requested by: Watt Harlene JAYSON, MD [address] [place],  [place] [address] PCP: Watt Harlene JAYSON, MD   Assessment & Plan: Visit Diagnoses:  1. Impingement syndrome of left shoulder     Plan: History of Present Illness Randall Scott is a 74 year old male with left shoulder impingement syndrome who presents for follow-up of left shoulder pain and review of recent MRI findings.  Over the past week his left shoulder pain has improved from severe aching with radiation down the arm to mild discomfort with sitting, now less than 1/10, with no significant pain during movement.  About one week ago he had a brief burning sensation in the left shoulder when reaching back to put on a jacket, which has mostly resolved. He is tolerating current symptoms without analgesics.  He previously had a shoulder injection without meaningful relief, which led to obtaining the recent MRI that he has already reviewed and shared with his rheumatologist.  Physical examination of left shoulder is unchanged from prior visit.  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 MRI showed no rotator cuff tear or need for surgery. Symptoms minimal, no functional limitation. - Symptoms have improved overall.  Recommend symptomatic treatment.  Follow-up if symptoms return.  Follow-Up Instructions: Return if symptoms worsen or fail to improve.   Orders:  No orders of the defined types were placed in this encounter.  No  orders of the defined types were placed in this encounter.     Procedures: No procedures performed   Clinical Data: No additional findings.   Subjective: Chief Complaint  Patient presents with   Left Shoulder - Pain, Follow-up    HPI  Review of Systems  Constitutional: Negative.   HENT: Negative.    Eyes: Negative.   Respiratory: Negative.    Cardiovascular: Negative.   Gastrointestinal: Negative.   Endocrine: Negative.   Genitourinary: Negative.   Skin: Negative.   Allergic/Immunologic: Negative.   Neurological: Negative.   Hematological: Negative.   Psychiatric/Behavioral: Negative.    All other systems reviewed and are negative.    Objective: Vital Signs: There were no vitals taken for this visit.  Physical Exam Vitals and nursing note reviewed.  Constitutional:      Appearance: He is well-developed.  HENT:     Head: Normocephalic and atraumatic.  Eyes:     Pupils: Pupils are equal, round, and reactive to light.  Pulmonary:     Effort: Pulmonary effort is normal.  Abdominal:     Palpations: Abdomen is soft.  Musculoskeletal:        General: Normal range of motion.     Cervical back: Neck supple.  Skin:    General: Skin is warm.  Neurological:     Mental Status: He is alert and oriented to person, place, and time.  Psychiatric:        Behavior: Behavior normal.        Thought Content: Thought  content normal.        Judgment: Judgment normal.     Ortho Exam  Specialty Comments:  No specialty comments available.  Imaging: No results found.   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   Headache 08/21/2013   Lateral epicondylitis of right elbow 04/01/2013   Past  Medical History:  Diagnosis Date   Allergy    Arthritis    Asthma    Cataract    GERD (gastroesophageal reflux disease)     Family History  Problem Relation Age of Onset   Lung cancer Mother    Lung cancer Father    Sudden death Neg Hx    Hypertension Neg Hx    Hyperlipidemia Neg Hx    Heart attack Neg Hx    Diabetes Neg Hx    Colon cancer Neg Hx    Rectal cancer Neg Hx    Esophageal cancer Neg Hx    Pancreatic cancer Neg Hx    Stomach cancer Neg Hx     Past Surgical History:  Procedure Laterality Date   COLONOSCOPY  08/30/2021   EYE SURGERY     HERNIA REPAIR     PERIPHERAL VASCULAR THROMBECTOMY Right    RLE   UPPER GASTROINTESTINAL ENDOSCOPY  08/30/2021   Social History   Occupational History   Occupation: Chiropodist: [employer]   Occupation: retired  Tobacco Use   Smoking status: Never   Smokeless tobacco: Never  Vaping Use   Vaping status: Never Used  Substance and Sexual Activity   Alcohol use: No   Drug use: No   Sexual activity: Yes    Birth control/protection: None    Comment: married        "

## 2024-07-28 ENCOUNTER — Ambulatory Visit: Admitting: Podiatry

## 2024-08-04 ENCOUNTER — Ambulatory Visit: Admitting: Podiatry

## 2024-08-13 ENCOUNTER — Ambulatory Visit: Admitting: Podiatry

## 2024-10-16 ENCOUNTER — Inpatient Hospital Stay

## 2024-10-16 ENCOUNTER — Inpatient Hospital Stay: Admitting: Hematology & Oncology

## 2024-12-09 ENCOUNTER — Ambulatory Visit

## 2025-04-27 ENCOUNTER — Encounter: Admitting: Family Medicine
